# Patient Record
Sex: Female | Born: 1955 | Race: White | Hispanic: No | Smoking: Never smoker
Health system: Southern US, Community
[De-identification: ages and names within clinical notes are randomized; demographics above are authoritative.]

## PROBLEM LIST (undated history)

## (undated) DIAGNOSIS — E785 Hyperlipidemia, unspecified: Secondary | ICD-10-CM

## (undated) DIAGNOSIS — R112 Nausea with vomiting, unspecified: Secondary | ICD-10-CM

## (undated) DIAGNOSIS — I499 Cardiac arrhythmia, unspecified: Secondary | ICD-10-CM

## (undated) DIAGNOSIS — N189 Chronic kidney disease, unspecified: Secondary | ICD-10-CM

## (undated) DIAGNOSIS — M797 Fibromyalgia: Secondary | ICD-10-CM

## (undated) DIAGNOSIS — Z923 Personal history of irradiation: Secondary | ICD-10-CM

## (undated) DIAGNOSIS — K219 Gastro-esophageal reflux disease without esophagitis: Secondary | ICD-10-CM

## (undated) DIAGNOSIS — I471 Supraventricular tachycardia, unspecified: Secondary | ICD-10-CM

## (undated) DIAGNOSIS — Z981 Arthrodesis status: Secondary | ICD-10-CM

## (undated) DIAGNOSIS — D649 Anemia, unspecified: Secondary | ICD-10-CM

## (undated) DIAGNOSIS — K429 Umbilical hernia without obstruction or gangrene: Secondary | ICD-10-CM

## (undated) DIAGNOSIS — T4145XA Adverse effect of unspecified anesthetic, initial encounter: Secondary | ICD-10-CM

## (undated) DIAGNOSIS — T8859XA Other complications of anesthesia, initial encounter: Secondary | ICD-10-CM

## (undated) DIAGNOSIS — I10 Essential (primary) hypertension: Secondary | ICD-10-CM

## (undated) DIAGNOSIS — C50919 Malignant neoplasm of unspecified site of unspecified female breast: Secondary | ICD-10-CM

## (undated) DIAGNOSIS — Z9889 Other specified postprocedural states: Secondary | ICD-10-CM

## (undated) DIAGNOSIS — E119 Type 2 diabetes mellitus without complications: Secondary | ICD-10-CM

## (undated) HISTORY — DX: Fibromyalgia: M79.7

## (undated) HISTORY — DX: Malignant neoplasm of unspecified site of unspecified female breast: C50.919

## (undated) HISTORY — DX: Hyperlipidemia, unspecified: E78.5

## (undated) HISTORY — DX: Supraventricular tachycardia, unspecified: I47.10

## (undated) HISTORY — DX: Gastro-esophageal reflux disease without esophagitis: K21.9

## (undated) HISTORY — DX: Supraventricular tachycardia: I47.1

## (undated) HISTORY — DX: Arthrodesis status: Z98.1

---

## 1995-06-29 HISTORY — PX: OTHER SURGICAL HISTORY: SHX169

## 1998-08-22 ENCOUNTER — Ambulatory Visit (HOSPITAL_COMMUNITY): Admission: RE | Admit: 1998-08-22 | Discharge: 1998-08-22 | Payer: Self-pay | Admitting: Internal Medicine

## 1999-08-28 ENCOUNTER — Encounter: Admission: RE | Admit: 1999-08-28 | Discharge: 1999-08-28 | Payer: Self-pay | Admitting: Obstetrics and Gynecology

## 1999-08-28 ENCOUNTER — Encounter: Payer: Self-pay | Admitting: Obstetrics and Gynecology

## 1999-12-24 ENCOUNTER — Other Ambulatory Visit: Admission: RE | Admit: 1999-12-24 | Discharge: 1999-12-24 | Payer: Self-pay | Admitting: Obstetrics and Gynecology

## 2001-04-07 ENCOUNTER — Other Ambulatory Visit: Admission: RE | Admit: 2001-04-07 | Discharge: 2001-04-07 | Payer: Self-pay | Admitting: Internal Medicine

## 2001-08-03 ENCOUNTER — Ambulatory Visit (HOSPITAL_COMMUNITY): Admission: RE | Admit: 2001-08-03 | Discharge: 2001-08-03 | Payer: Self-pay | Admitting: Internal Medicine

## 2001-08-11 ENCOUNTER — Encounter: Admission: RE | Admit: 2001-08-11 | Discharge: 2001-08-11 | Payer: Self-pay | Admitting: Obstetrics and Gynecology

## 2001-08-11 ENCOUNTER — Encounter: Payer: Self-pay | Admitting: Obstetrics and Gynecology

## 2002-04-10 ENCOUNTER — Other Ambulatory Visit: Admission: RE | Admit: 2002-04-10 | Discharge: 2002-04-10 | Payer: Self-pay | Admitting: Internal Medicine

## 2003-05-09 ENCOUNTER — Other Ambulatory Visit: Admission: RE | Admit: 2003-05-09 | Discharge: 2003-05-09 | Payer: Self-pay | Admitting: Internal Medicine

## 2003-05-16 ENCOUNTER — Ambulatory Visit (HOSPITAL_COMMUNITY): Admission: RE | Admit: 2003-05-16 | Discharge: 2003-05-16 | Payer: Self-pay | Admitting: Internal Medicine

## 2003-06-29 DIAGNOSIS — Z981 Arthrodesis status: Secondary | ICD-10-CM

## 2003-06-29 HISTORY — DX: Arthrodesis status: Z98.1

## 2003-06-29 HISTORY — PX: LUMBAR FUSION: SHX111

## 2003-07-02 ENCOUNTER — Encounter: Admission: RE | Admit: 2003-07-02 | Discharge: 2003-07-02 | Payer: Self-pay | Admitting: Obstetrics and Gynecology

## 2003-11-06 ENCOUNTER — Inpatient Hospital Stay (HOSPITAL_COMMUNITY): Admission: RE | Admit: 2003-11-06 | Discharge: 2003-11-10 | Payer: Self-pay | Admitting: Neurosurgery

## 2004-05-26 ENCOUNTER — Other Ambulatory Visit: Admission: RE | Admit: 2004-05-26 | Discharge: 2004-05-26 | Payer: Self-pay | Admitting: Obstetrics and Gynecology

## 2005-04-13 ENCOUNTER — Encounter: Admission: RE | Admit: 2005-04-13 | Discharge: 2005-04-13 | Payer: Self-pay | Admitting: Internal Medicine

## 2006-06-13 ENCOUNTER — Encounter: Admission: RE | Admit: 2006-06-13 | Discharge: 2006-06-13 | Payer: Self-pay | Admitting: Internal Medicine

## 2006-06-24 ENCOUNTER — Encounter (INDEPENDENT_AMBULATORY_CARE_PROVIDER_SITE_OTHER): Payer: Self-pay | Admitting: Specialist

## 2006-06-24 ENCOUNTER — Encounter: Admission: RE | Admit: 2006-06-24 | Discharge: 2006-06-24 | Payer: Self-pay | Admitting: Internal Medicine

## 2006-06-28 DIAGNOSIS — C50919 Malignant neoplasm of unspecified site of unspecified female breast: Secondary | ICD-10-CM

## 2006-06-28 HISTORY — PX: BREAST LUMPECTOMY: SHX2

## 2006-06-28 HISTORY — DX: Malignant neoplasm of unspecified site of unspecified female breast: C50.919

## 2006-07-03 ENCOUNTER — Encounter: Admission: RE | Admit: 2006-07-03 | Discharge: 2006-07-03 | Payer: Self-pay | Admitting: Internal Medicine

## 2006-07-15 ENCOUNTER — Encounter (INDEPENDENT_AMBULATORY_CARE_PROVIDER_SITE_OTHER): Payer: Self-pay | Admitting: *Deleted

## 2006-07-15 ENCOUNTER — Ambulatory Visit (HOSPITAL_COMMUNITY): Admission: RE | Admit: 2006-07-15 | Discharge: 2006-07-15 | Payer: Self-pay | Admitting: Surgery

## 2006-07-15 ENCOUNTER — Encounter: Admission: RE | Admit: 2006-07-15 | Discharge: 2006-07-15 | Payer: Self-pay | Admitting: Surgery

## 2006-07-22 ENCOUNTER — Ambulatory Visit: Payer: Self-pay | Admitting: Oncology

## 2006-07-27 LAB — COMPREHENSIVE METABOLIC PANEL
AST: 25 U/L (ref 0–37)
Albumin: 4.4 g/dL (ref 3.5–5.2)
BUN: 13 mg/dL (ref 6–23)
CO2: 24 mEq/L (ref 19–32)
Calcium: 9.2 mg/dL (ref 8.4–10.5)
Chloride: 106 mEq/L (ref 96–112)
Creatinine, Ser: 0.78 mg/dL (ref 0.40–1.20)
Glucose, Bld: 118 mg/dL — ABNORMAL HIGH (ref 70–99)
Potassium: 3.6 mEq/L (ref 3.5–5.3)

## 2006-07-27 LAB — CBC WITH DIFFERENTIAL/PLATELET
Basophils Absolute: 0 10*3/uL (ref 0.0–0.1)
EOS%: 2.7 % (ref 0.0–7.0)
Eosinophils Absolute: 0.1 10*3/uL (ref 0.0–0.5)
HCT: 39.5 % (ref 34.8–46.6)
HGB: 13.1 g/dL (ref 11.6–15.9)
LYMPH%: 26.5 % (ref 14.0–48.0)
MCH: 29.6 pg (ref 26.0–34.0)
MCV: 89.5 fL (ref 81.0–101.0)
MONO%: 6.3 % (ref 0.0–13.0)
NEUT%: 63.7 % (ref 39.6–76.8)
Platelets: 320 10*3/uL (ref 145–400)
RDW: 14.3 % (ref 11.3–14.5)

## 2006-08-01 ENCOUNTER — Ambulatory Visit (HOSPITAL_COMMUNITY): Admission: RE | Admit: 2006-08-01 | Discharge: 2006-08-01 | Payer: Self-pay | Admitting: Oncology

## 2006-08-04 ENCOUNTER — Encounter: Admission: RE | Admit: 2006-08-04 | Discharge: 2006-08-04 | Payer: Self-pay | Admitting: Oncology

## 2006-08-11 LAB — CBC WITH DIFFERENTIAL/PLATELET
BASO%: 0.4 % (ref 0.0–2.0)
Basophils Absolute: 0 10*3/uL (ref 0.0–0.1)
EOS%: 3.2 % (ref 0.0–7.0)
HCT: 37.5 % (ref 34.8–46.6)
HGB: 12.8 g/dL (ref 11.6–15.9)
MCH: 30 pg (ref 26.0–34.0)
MONO#: 0.3 10*3/uL (ref 0.1–0.9)
NEUT#: 3.5 10*3/uL (ref 1.5–6.5)
NEUT%: 66.4 % (ref 39.6–76.8)
RDW: 14.1 % (ref 11.3–14.5)
WBC: 5.3 10*3/uL (ref 3.9–10.0)
lymph#: 1.3 10*3/uL (ref 0.9–3.3)

## 2006-08-11 LAB — COMPREHENSIVE METABOLIC PANEL
ALT: 34 U/L (ref 0–35)
AST: 22 U/L (ref 0–37)
Albumin: 4.2 g/dL (ref 3.5–5.2)
BUN: 11 mg/dL (ref 6–23)
CO2: 21 mEq/L (ref 19–32)
Calcium: 9 mg/dL (ref 8.4–10.5)
Chloride: 108 mEq/L (ref 96–112)
Creatinine, Ser: 0.71 mg/dL (ref 0.40–1.20)
Potassium: 4 mEq/L (ref 3.5–5.3)

## 2006-08-11 LAB — FSH/LH: FSH: 75.6 m[IU]/mL

## 2006-08-11 LAB — LACTATE DEHYDROGENASE: LDH: 159 U/L (ref 94–250)

## 2006-08-16 ENCOUNTER — Ambulatory Visit: Admission: RE | Admit: 2006-08-16 | Discharge: 2006-11-02 | Payer: Self-pay | Admitting: Radiation Oncology

## 2006-09-06 ENCOUNTER — Ambulatory Visit: Payer: Self-pay | Admitting: Oncology

## 2006-11-30 ENCOUNTER — Ambulatory Visit: Payer: Self-pay | Admitting: Oncology

## 2006-12-02 LAB — CBC WITH DIFFERENTIAL/PLATELET
Basophils Absolute: 0 10*3/uL (ref 0.0–0.1)
Eosinophils Absolute: 0.1 10*3/uL (ref 0.0–0.5)
HCT: 35.7 % (ref 34.8–46.6)
HGB: 12.3 g/dL (ref 11.6–15.9)
LYMPH%: 21.2 % (ref 14.0–48.0)
MCV: 88.6 fL (ref 81.0–101.0)
MONO#: 0.5 10*3/uL (ref 0.1–0.9)
MONO%: 8.4 % (ref 0.0–13.0)
NEUT#: 4 10*3/uL (ref 1.5–6.5)
NEUT%: 68.7 % (ref 39.6–76.8)
Platelets: 298 10*3/uL (ref 145–400)

## 2006-12-02 LAB — LACTATE DEHYDROGENASE: LDH: 183 U/L (ref 94–250)

## 2006-12-02 LAB — COMPREHENSIVE METABOLIC PANEL
Alkaline Phosphatase: 29 U/L — ABNORMAL LOW (ref 39–117)
BUN: 17 mg/dL (ref 6–23)
CO2: 25 mEq/L (ref 19–32)
Glucose, Bld: 111 mg/dL — ABNORMAL HIGH (ref 70–99)
Total Bilirubin: 0.4 mg/dL (ref 0.3–1.2)

## 2006-12-02 LAB — CANCER ANTIGEN 27.29: CA 27.29: 15 U/mL (ref 0–39)

## 2007-03-07 ENCOUNTER — Encounter (INDEPENDENT_AMBULATORY_CARE_PROVIDER_SITE_OTHER): Payer: Self-pay | Admitting: Gastroenterology

## 2007-03-07 ENCOUNTER — Ambulatory Visit (HOSPITAL_COMMUNITY): Admission: RE | Admit: 2007-03-07 | Discharge: 2007-03-07 | Payer: Self-pay | Admitting: Gastroenterology

## 2007-03-15 ENCOUNTER — Ambulatory Visit: Payer: Self-pay | Admitting: Oncology

## 2007-03-17 LAB — CBC WITH DIFFERENTIAL/PLATELET
BASO%: 0.6 % (ref 0.0–2.0)
Eosinophils Absolute: 0.1 10*3/uL (ref 0.0–0.5)
HCT: 33.3 % — ABNORMAL LOW (ref 34.8–46.6)
LYMPH%: 23.4 % (ref 14.0–48.0)
MONO#: 0.4 10*3/uL (ref 0.1–0.9)
NEUT#: 3.7 10*3/uL (ref 1.5–6.5)
Platelets: 281 10*3/uL (ref 145–400)
RBC: 3.79 10*6/uL (ref 3.70–5.32)
WBC: 5.6 10*3/uL (ref 3.9–10.0)
lymph#: 1.3 10*3/uL (ref 0.9–3.3)

## 2007-03-17 LAB — COMPREHENSIVE METABOLIC PANEL
ALT: 13 U/L (ref 0–35)
Albumin: 3.5 g/dL (ref 3.5–5.2)
CO2: 23 mEq/L (ref 19–32)
Calcium: 8.7 mg/dL (ref 8.4–10.5)
Chloride: 110 mEq/L (ref 96–112)
Glucose, Bld: 103 mg/dL — ABNORMAL HIGH (ref 70–99)
Sodium: 141 mEq/L (ref 135–145)
Total Bilirubin: 0.3 mg/dL (ref 0.3–1.2)
Total Protein: 6.4 g/dL (ref 6.0–8.3)

## 2007-03-17 LAB — LACTATE DEHYDROGENASE: LDH: 178 U/L (ref 94–250)

## 2007-03-17 LAB — CANCER ANTIGEN 27.29: CA 27.29: 16 U/mL (ref 0–39)

## 2007-04-02 IMAGING — CR DG LUMBAR SPINE 2-3V
4 series · 4 of 4 positions shown · non-contrast
Comparison: None.
COMPARISON: None.

CLINICAL DATA: New diagnosis of left-sided breast cancer.  Left lumpectomy.  History of lumbar spinal fusion.
 NUCLEAR MEDICINE WHOLE BODY BONE SCAN ? 08/01/06:
TECHNIQUE: Whole body anterior and posterior images were obtained approximately 3 hours after intravenous injection of radiopharmaceutical. 
 Radiopharmaceutical:  24.0 mCi 2c-11m MDP.

[view not recorded (1 of 4)]
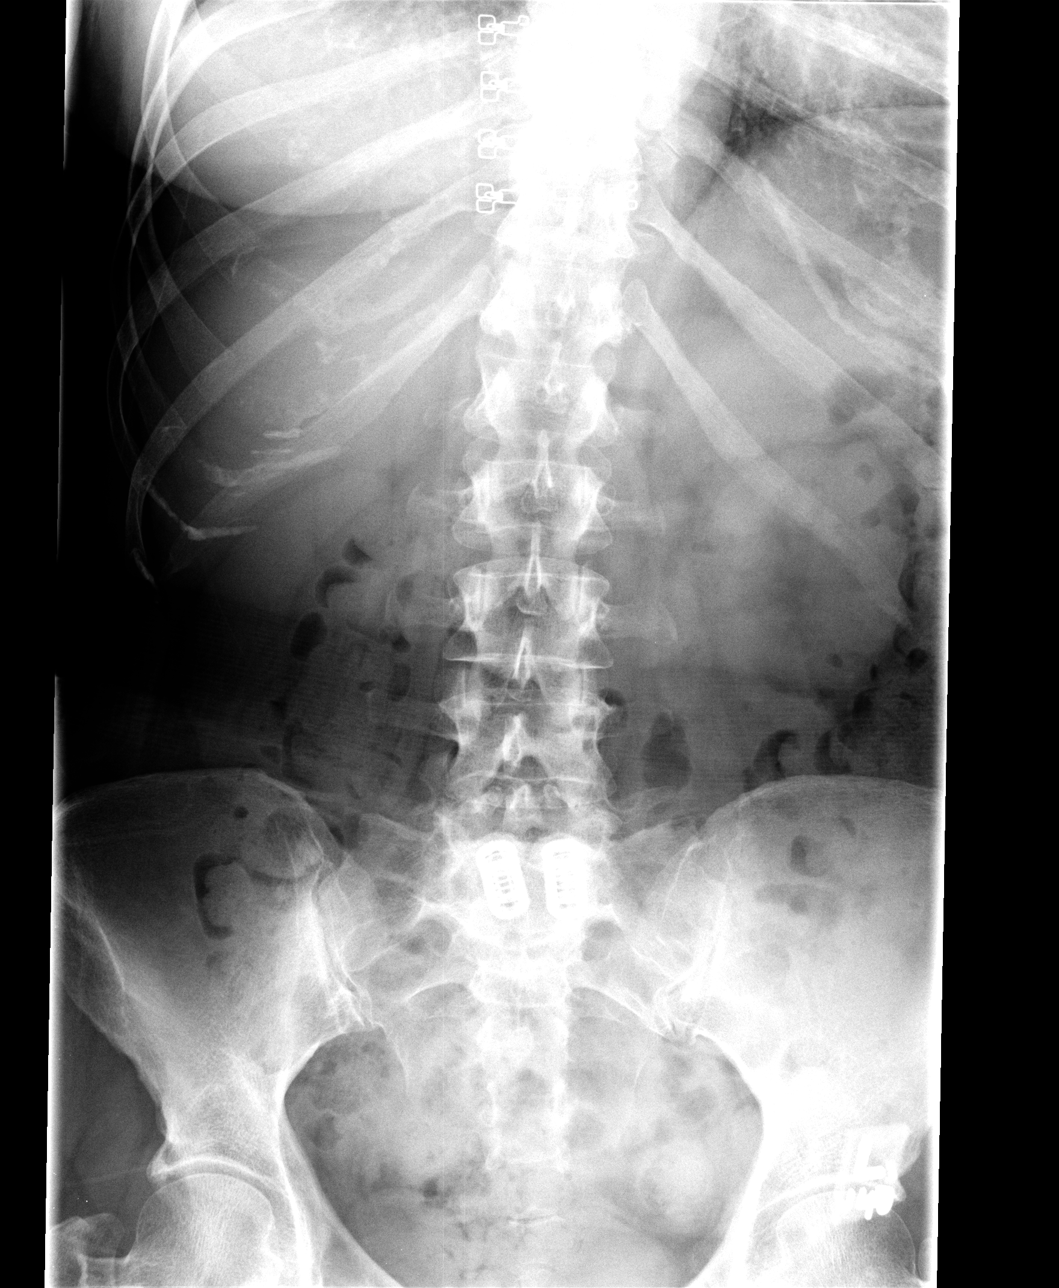

[view not recorded (2 of 4)]
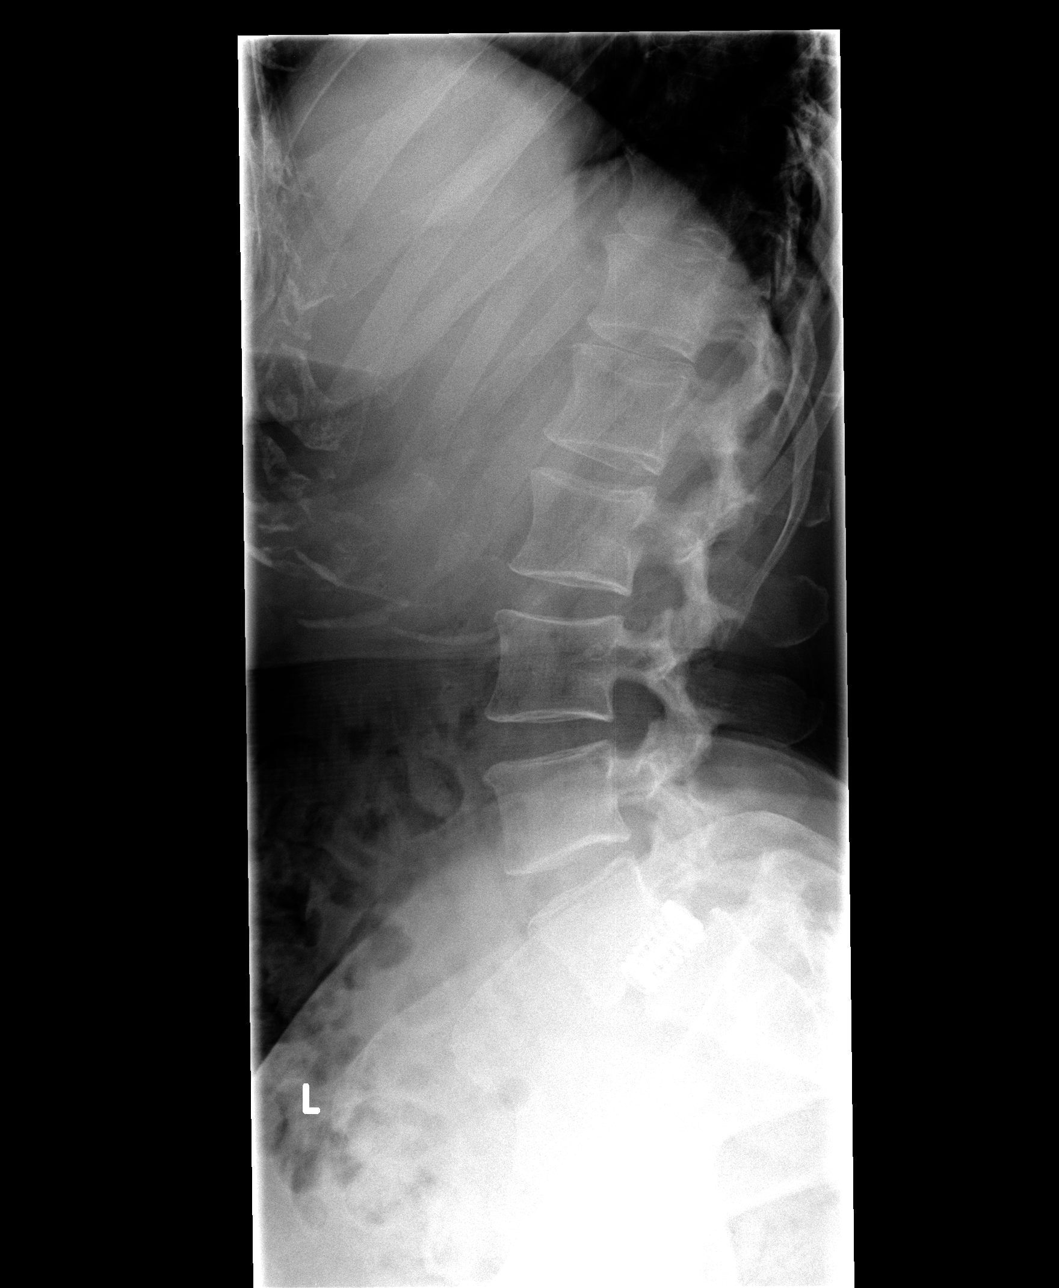

[view not recorded (3 of 4)]
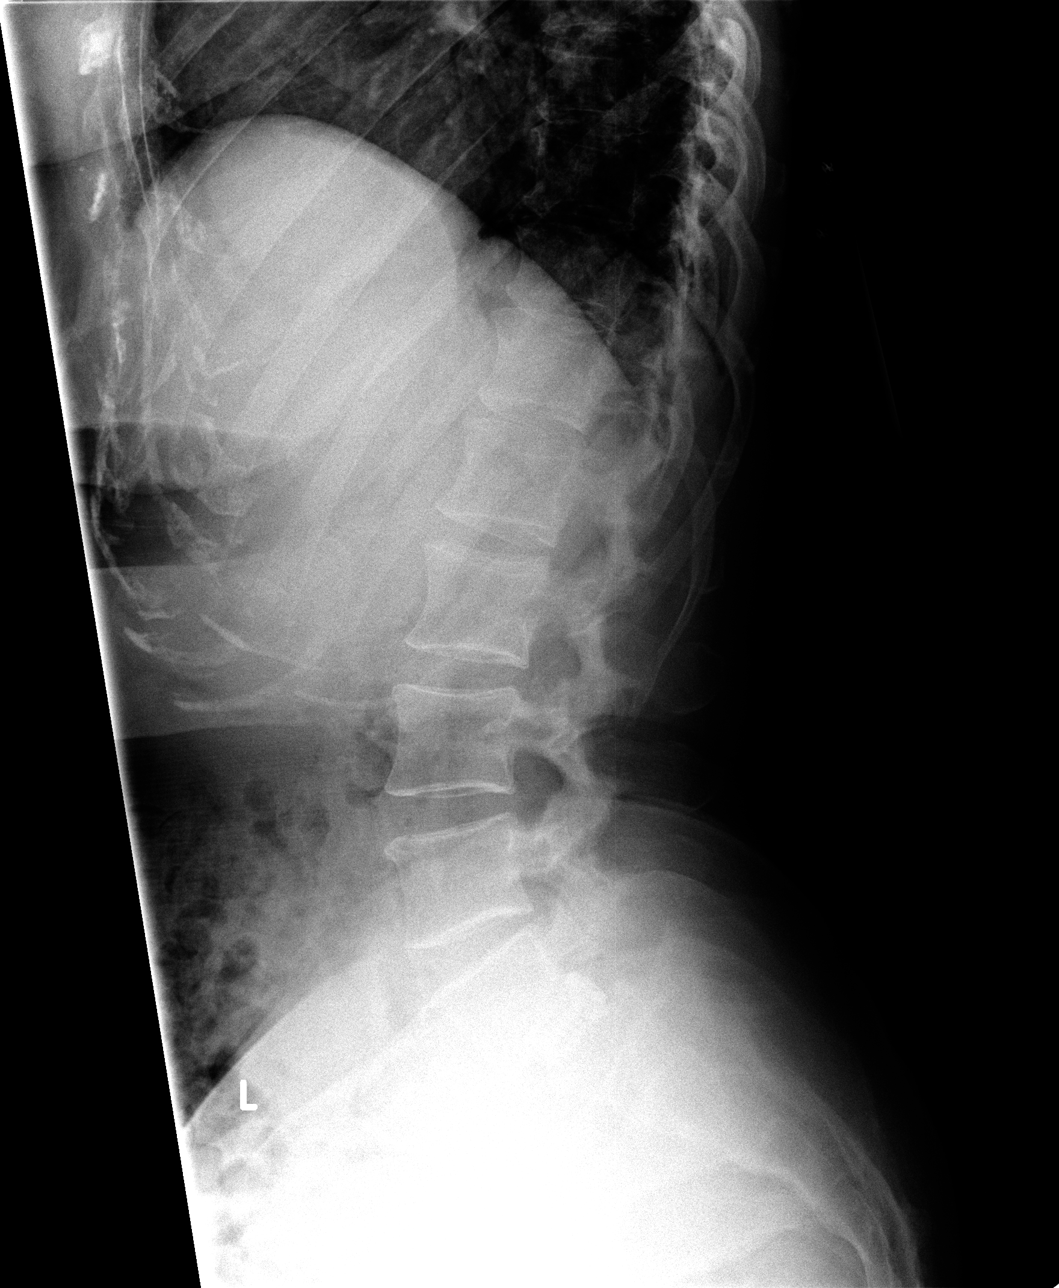

[view not recorded (4 of 4)]
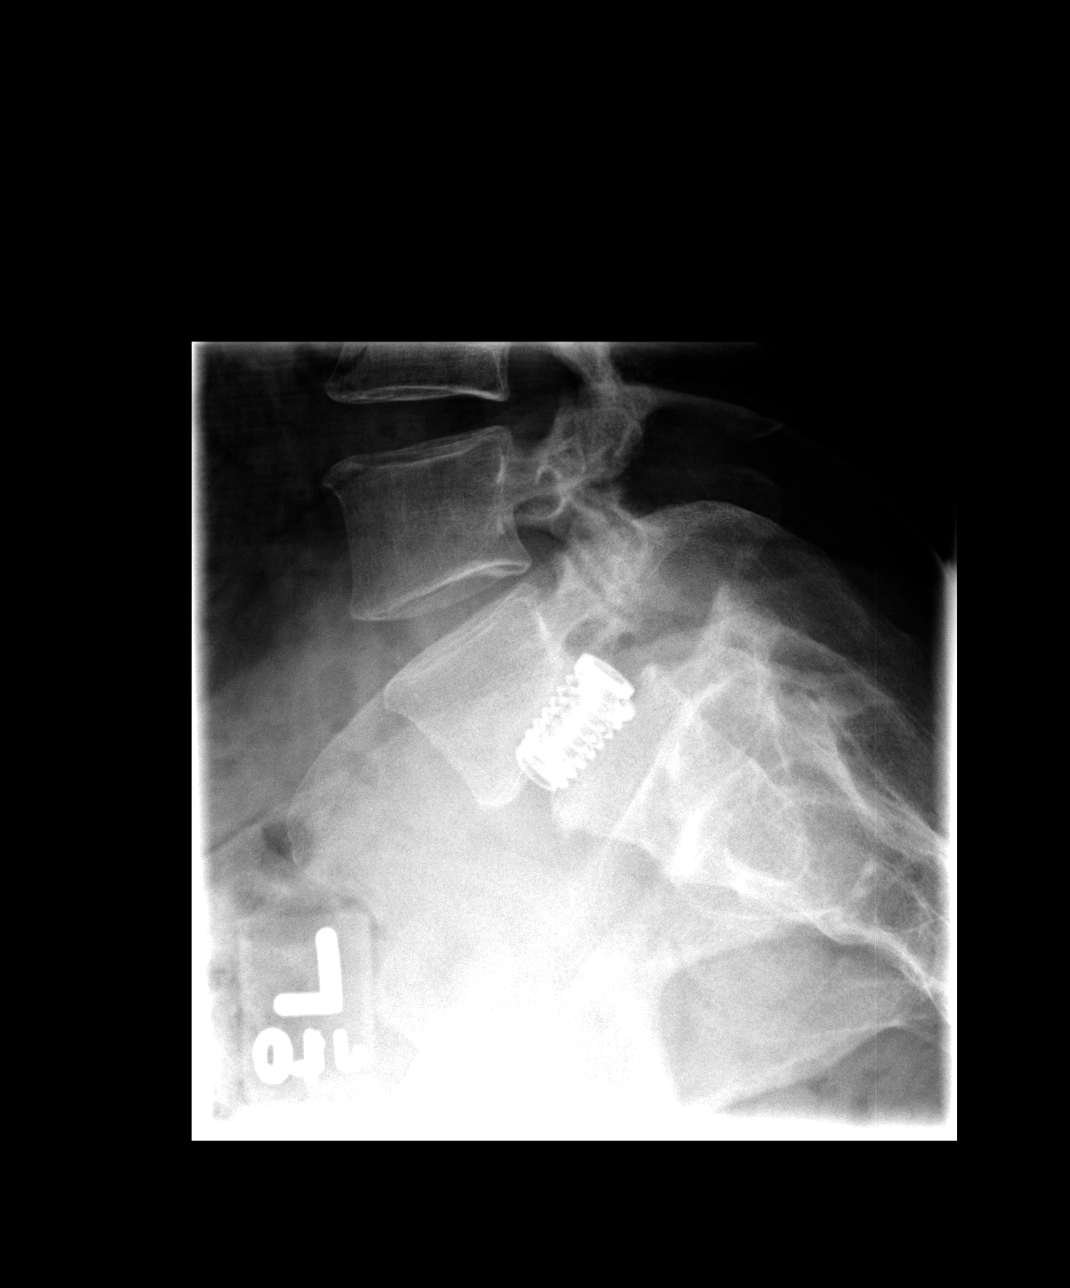

[4 of 4 positions shown; findings below may reference images not displayed]

FINDINGS: Contamination of the right antecubital fossa region.   Right-sided mandibular activity is likely due to dental caries.  Midfoot activity, primarily on the right, is likely degenerative.  There is mild activity about the left chest which on the oblique images is separate from ribs.  Mild activity involves the lumbosacral junction as well.
IMPRESSION: 1.  No evidence of osseous metastasis.
 2.  Left breast activity is likely postoperative.
 3.  Lumbosacral spinal activity will be discussed on plain films below.
 4.  Right mandibular activity is likely dental.  Recommend clinical correlation.
 LUMBOSACRAL SPINE ? 4 VIEWS ? 08/01/06:
FINDINGS: The patient is status post L5-S1 fixation. There is Grade I-II anterolisthesis of L5 on S1.  There is no evidence of underlying osseous metastasis.  Vertebral body height is maintained.
IMPRESSION: Postsurgical changes at the lumbosacral junction without osseous metastasis.

## 2007-05-15 ENCOUNTER — Encounter: Admission: RE | Admit: 2007-05-15 | Discharge: 2007-05-15 | Payer: Self-pay | Admitting: Oncology

## 2007-06-15 ENCOUNTER — Ambulatory Visit: Payer: Self-pay | Admitting: Oncology

## 2007-06-19 LAB — COMPREHENSIVE METABOLIC PANEL
CO2: 25 mEq/L (ref 19–32)
Calcium: 9 mg/dL (ref 8.4–10.5)
Chloride: 106 mEq/L (ref 96–112)
Creatinine, Ser: 0.91 mg/dL (ref 0.40–1.20)
Glucose, Bld: 94 mg/dL (ref 70–99)
Total Bilirubin: 0.3 mg/dL (ref 0.3–1.2)

## 2007-06-19 LAB — CBC WITH DIFFERENTIAL/PLATELET
Basophils Absolute: 0.1 10*3/uL (ref 0.0–0.1)
Eosinophils Absolute: 0.1 10*3/uL (ref 0.0–0.5)
HCT: 34.7 % — ABNORMAL LOW (ref 34.8–46.6)
HGB: 12.1 g/dL (ref 11.6–15.9)
LYMPH%: 27.7 % (ref 14.0–48.0)
MONO#: 0.5 10*3/uL (ref 0.1–0.9)
NEUT#: 3.1 10*3/uL (ref 1.5–6.5)
NEUT%: 60 % (ref 39.6–76.8)
Platelets: 285 10*3/uL (ref 145–400)
WBC: 5.1 10*3/uL (ref 3.9–10.0)
lymph#: 1.4 10*3/uL (ref 0.9–3.3)

## 2007-06-19 LAB — CANCER ANTIGEN 27.29: CA 27.29: 13 U/mL (ref 0–39)

## 2007-06-19 LAB — LACTATE DEHYDROGENASE: LDH: 200 U/L (ref 94–250)

## 2007-10-16 ENCOUNTER — Ambulatory Visit: Payer: Self-pay | Admitting: Oncology

## 2007-10-18 LAB — CBC WITH DIFFERENTIAL/PLATELET
BASO%: 1.1 % (ref 0.0–2.0)
Basophils Absolute: 0.1 10*3/uL (ref 0.0–0.1)
EOS%: 2.8 % (ref 0.0–7.0)
HCT: 34.3 % — ABNORMAL LOW (ref 34.8–46.6)
HGB: 12 g/dL (ref 11.6–15.9)
LYMPH%: 24.3 % (ref 14.0–48.0)
MCH: 30.8 pg (ref 26.0–34.0)
MCHC: 34.8 g/dL (ref 32.0–36.0)
MCV: 88.4 fL (ref 81.0–101.0)
MONO%: 8 % (ref 0.0–13.0)
NEUT%: 63.8 % (ref 39.6–76.8)
Platelets: 328 10*3/uL (ref 145–400)
lymph#: 1.3 10*3/uL (ref 0.9–3.3)

## 2007-10-19 LAB — COMPREHENSIVE METABOLIC PANEL
ALT: 27 U/L (ref 0–35)
AST: 21 U/L (ref 0–37)
Alkaline Phosphatase: 25 U/L — ABNORMAL LOW (ref 39–117)
BUN: 19 mg/dL (ref 6–23)
Calcium: 9.4 mg/dL (ref 8.4–10.5)
Chloride: 109 mEq/L (ref 96–112)
Creatinine, Ser: 1.02 mg/dL (ref 0.40–1.20)
Total Bilirubin: 0.3 mg/dL (ref 0.3–1.2)

## 2007-10-30 LAB — ESTRADIOL, ULTRA SENS: Estradiol, Ultra Sensitive: 9 pg/mL

## 2008-02-13 ENCOUNTER — Ambulatory Visit: Payer: Self-pay | Admitting: Vascular Surgery

## 2008-02-26 ENCOUNTER — Emergency Department (HOSPITAL_COMMUNITY): Admission: EM | Admit: 2008-02-26 | Discharge: 2008-02-26 | Payer: Self-pay | Admitting: Family Medicine

## 2008-04-22 ENCOUNTER — Ambulatory Visit: Payer: Self-pay | Admitting: Oncology

## 2008-04-24 LAB — CBC WITH DIFFERENTIAL/PLATELET
Basophils Absolute: 0 10*3/uL (ref 0.0–0.1)
Eosinophils Absolute: 0.1 10*3/uL (ref 0.0–0.5)
HGB: 12 g/dL (ref 11.6–15.9)
LYMPH%: 19.4 % (ref 14.0–48.0)
MCV: 90.1 fL (ref 81.0–101.0)
MONO#: 0.4 10*3/uL (ref 0.1–0.9)
MONO%: 5.6 % (ref 0.0–13.0)
NEUT#: 5 10*3/uL (ref 1.5–6.5)
Platelets: 329 10*3/uL (ref 145–400)
RDW: 14.4 % (ref 11.3–14.5)
WBC: 6.8 10*3/uL (ref 3.9–10.0)

## 2008-04-25 LAB — COMPREHENSIVE METABOLIC PANEL
Albumin: 4.5 g/dL (ref 3.5–5.2)
Alkaline Phosphatase: 31 U/L — ABNORMAL LOW (ref 39–117)
BUN: 22 mg/dL (ref 6–23)
CO2: 23 mEq/L (ref 19–32)
Glucose, Bld: 136 mg/dL — ABNORMAL HIGH (ref 70–99)
Potassium: 3.8 mEq/L (ref 3.5–5.3)
Sodium: 143 mEq/L (ref 135–145)
Total Protein: 7.2 g/dL (ref 6.0–8.3)

## 2008-04-25 LAB — LACTATE DEHYDROGENASE: LDH: 241 U/L (ref 94–250)

## 2008-04-25 LAB — CANCER ANTIGEN 27.29: CA 27.29: 20 U/mL (ref 0–39)

## 2008-04-25 LAB — FOLLICLE STIMULATING HORMONE: FSH: 88.3 m[IU]/mL

## 2008-04-30 LAB — ESTRADIOL, ULTRA SENS

## 2008-05-15 ENCOUNTER — Encounter: Admission: RE | Admit: 2008-05-15 | Discharge: 2008-05-15 | Payer: Self-pay | Admitting: Internal Medicine

## 2008-05-21 ENCOUNTER — Ambulatory Visit: Payer: Self-pay | Admitting: Vascular Surgery

## 2008-06-17 ENCOUNTER — Ambulatory Visit: Payer: Self-pay | Admitting: Internal Medicine

## 2008-06-17 ENCOUNTER — Ambulatory Visit: Payer: Self-pay | Admitting: Vascular Surgery

## 2008-07-01 ENCOUNTER — Ambulatory Visit: Payer: Self-pay | Admitting: Vascular Surgery

## 2008-08-01 ENCOUNTER — Ambulatory Visit: Payer: Self-pay | Admitting: Vascular Surgery

## 2008-10-04 ENCOUNTER — Encounter: Admission: RE | Admit: 2008-10-04 | Discharge: 2008-10-04 | Payer: Self-pay | Admitting: Neurosurgery

## 2008-10-28 ENCOUNTER — Ambulatory Visit: Payer: Self-pay | Admitting: Oncology

## 2008-10-30 LAB — CBC WITH DIFFERENTIAL/PLATELET
Basophils Absolute: 0 10*3/uL (ref 0.0–0.1)
Eosinophils Absolute: 0.1 10*3/uL (ref 0.0–0.5)
HGB: 11.5 g/dL — ABNORMAL LOW (ref 11.6–15.9)
LYMPH%: 25.1 % (ref 14.0–49.7)
MCV: 90.7 fL (ref 79.5–101.0)
MONO#: 0.3 10*3/uL (ref 0.1–0.9)
MONO%: 6 % (ref 0.0–14.0)
NEUT#: 3.8 10*3/uL (ref 1.5–6.5)
Platelets: 378 10*3/uL (ref 145–400)
WBC: 5.7 10*3/uL (ref 3.9–10.3)

## 2008-10-30 LAB — COMPREHENSIVE METABOLIC PANEL
Albumin: 3.8 g/dL (ref 3.5–5.2)
Alkaline Phosphatase: 28 U/L — ABNORMAL LOW (ref 39–117)
BUN: 20 mg/dL (ref 6–23)
CO2: 25 mEq/L (ref 19–32)
Glucose, Bld: 121 mg/dL — ABNORMAL HIGH (ref 70–99)
Potassium: 3.3 mEq/L — ABNORMAL LOW (ref 3.5–5.3)
Total Protein: 7.2 g/dL (ref 6.0–8.3)

## 2008-10-31 LAB — CANCER ANTIGEN 27.29: CA 27.29: 23 U/mL (ref 0–39)

## 2008-10-31 LAB — FOLLICLE STIMULATING HORMONE: FSH: 81.4 m[IU]/mL

## 2008-11-08 LAB — ESTRADIOL, ULTRA SENS

## 2008-11-29 ENCOUNTER — Ambulatory Visit: Payer: Self-pay | Admitting: Internal Medicine

## 2009-05-16 ENCOUNTER — Encounter: Admission: RE | Admit: 2009-05-16 | Discharge: 2009-05-16 | Payer: Self-pay | Admitting: Oncology

## 2009-05-27 ENCOUNTER — Ambulatory Visit: Payer: Self-pay | Admitting: Oncology

## 2009-05-29 LAB — COMPREHENSIVE METABOLIC PANEL
ALT: 31 U/L (ref 0–35)
AST: 24 U/L (ref 0–37)
Alkaline Phosphatase: 29 U/L — ABNORMAL LOW (ref 39–117)
Sodium: 141 mEq/L (ref 135–145)
Total Bilirubin: 0.4 mg/dL (ref 0.3–1.2)
Total Protein: 6.9 g/dL (ref 6.0–8.3)

## 2009-05-29 LAB — CBC WITH DIFFERENTIAL/PLATELET
BASO%: 0.6 % (ref 0.0–2.0)
Basophils Absolute: 0 10*3/uL (ref 0.0–0.1)
EOS%: 2.4 % (ref 0.0–7.0)
Eosinophils Absolute: 0.2 10*3/uL (ref 0.0–0.5)
HGB: 11.1 g/dL — ABNORMAL LOW (ref 11.6–15.9)
LYMPH%: 28.3 % (ref 14.0–49.7)
MCV: 92.3 fL (ref 79.5–101.0)
NEUT#: 4.1 10*3/uL (ref 1.5–6.5)
NEUT%: 62.5 % (ref 38.4–76.8)
Platelets: 307 10*3/uL (ref 145–400)
RDW: 14.6 % — ABNORMAL HIGH (ref 11.2–14.5)
nRBC: 0 % (ref 0–0)

## 2009-06-26 ENCOUNTER — Ambulatory Visit: Payer: Self-pay | Admitting: Internal Medicine

## 2009-09-25 ENCOUNTER — Ambulatory Visit: Payer: Self-pay | Admitting: Internal Medicine

## 2009-12-05 ENCOUNTER — Ambulatory Visit: Payer: Self-pay | Admitting: Oncology

## 2009-12-09 LAB — COMPREHENSIVE METABOLIC PANEL
AST: 41 U/L — ABNORMAL HIGH (ref 0–37)
Albumin: 3.4 g/dL — ABNORMAL LOW (ref 3.5–5.2)
Alkaline Phosphatase: 28 U/L — ABNORMAL LOW (ref 39–117)
Calcium: 8.9 mg/dL (ref 8.4–10.5)
Chloride: 106 mEq/L (ref 96–112)
Glucose, Bld: 104 mg/dL — ABNORMAL HIGH (ref 70–99)
Potassium: 4.8 mEq/L (ref 3.5–5.3)
Sodium: 140 mEq/L (ref 135–145)
Total Protein: 6.8 g/dL (ref 6.0–8.3)

## 2009-12-09 LAB — CANCER ANTIGEN 27.29: CA 27.29: 21 U/mL (ref 0–39)

## 2009-12-09 LAB — VITAMIN D 25 HYDROXY (VIT D DEFICIENCY, FRACTURES): Vit D, 25-Hydroxy: 52 ng/mL (ref 30–89)

## 2009-12-16 LAB — CBC WITH DIFFERENTIAL/PLATELET
BASO%: 0.7 % (ref 0.0–2.0)
Basophils Absolute: 0 10*3/uL (ref 0.0–0.1)
EOS%: 4.1 % (ref 0.0–7.0)
MCH: 30.7 pg (ref 25.1–34.0)
MCHC: 33.5 g/dL (ref 31.5–36.0)
MCV: 91.5 fL (ref 79.5–101.0)
MONO%: 6.5 % (ref 0.0–14.0)
RBC: 3.5 10*6/uL — ABNORMAL LOW (ref 3.70–5.45)
RDW: 14.4 % (ref 11.2–14.5)

## 2009-12-25 ENCOUNTER — Ambulatory Visit: Payer: Self-pay | Admitting: Internal Medicine

## 2010-05-18 ENCOUNTER — Encounter: Admission: RE | Admit: 2010-05-18 | Discharge: 2010-05-18 | Payer: Self-pay | Admitting: Oncology

## 2010-06-08 ENCOUNTER — Ambulatory Visit: Payer: Self-pay | Admitting: Oncology

## 2010-06-10 LAB — CBC WITH DIFFERENTIAL/PLATELET
Eosinophils Absolute: 0.3 10*3/uL (ref 0.0–0.5)
MCV: 90.4 fL (ref 79.5–101.0)
MONO%: 7.4 % (ref 0.0–14.0)
NEUT#: 3.7 10*3/uL (ref 1.5–6.5)
RBC: 3.65 10*6/uL — ABNORMAL LOW (ref 3.70–5.45)
RDW: 15.8 % — ABNORMAL HIGH (ref 11.2–14.5)
WBC: 6 10*3/uL (ref 3.9–10.3)
lymph#: 1.6 10*3/uL (ref 0.9–3.3)

## 2010-06-10 LAB — COMPREHENSIVE METABOLIC PANEL
AST: 44 U/L — ABNORMAL HIGH (ref 0–37)
Albumin: 3.3 g/dL — ABNORMAL LOW (ref 3.5–5.2)
Alkaline Phosphatase: 30 U/L — ABNORMAL LOW (ref 39–117)
Glucose, Bld: 133 mg/dL — ABNORMAL HIGH (ref 70–99)
Potassium: 3.8 mEq/L (ref 3.5–5.3)
Sodium: 141 mEq/L (ref 135–145)
Total Protein: 6.2 g/dL (ref 6.0–8.3)

## 2010-06-25 ENCOUNTER — Ambulatory Visit: Payer: Self-pay | Admitting: Internal Medicine

## 2010-07-02 ENCOUNTER — Encounter
Admission: RE | Admit: 2010-07-02 | Discharge: 2010-07-02 | Payer: Self-pay | Source: Home / Self Care | Attending: Internal Medicine | Admitting: Internal Medicine

## 2010-07-02 ENCOUNTER — Ambulatory Visit
Admission: RE | Admit: 2010-07-02 | Discharge: 2010-07-02 | Payer: Self-pay | Source: Home / Self Care | Attending: Internal Medicine | Admitting: Internal Medicine

## 2010-07-19 ENCOUNTER — Encounter: Payer: Self-pay | Admitting: Oncology

## 2010-07-30 ENCOUNTER — Ambulatory Visit: Payer: Self-pay | Admitting: Internal Medicine

## 2010-08-03 ENCOUNTER — Ambulatory Visit (INDEPENDENT_AMBULATORY_CARE_PROVIDER_SITE_OTHER): Payer: Commercial Managed Care - PPO | Admitting: Internal Medicine

## 2010-08-03 DIAGNOSIS — R6889 Other general symptoms and signs: Secondary | ICD-10-CM

## 2010-08-14 ENCOUNTER — Other Ambulatory Visit: Payer: Self-pay | Admitting: Orthopedic Surgery

## 2010-08-14 DIAGNOSIS — R609 Edema, unspecified: Secondary | ICD-10-CM

## 2010-08-14 DIAGNOSIS — M1990 Unspecified osteoarthritis, unspecified site: Secondary | ICD-10-CM

## 2010-08-18 ENCOUNTER — Other Ambulatory Visit: Payer: Commercial Managed Care - PPO

## 2010-08-18 ENCOUNTER — Ambulatory Visit
Admission: RE | Admit: 2010-08-18 | Discharge: 2010-08-18 | Disposition: A | Discharge status: Home / Self Care | Payer: Commercial Managed Care - PPO | Source: Ambulatory Visit | Attending: Orthopedic Surgery | Admitting: Orthopedic Surgery

## 2010-08-18 DIAGNOSIS — R609 Edema, unspecified: Secondary | ICD-10-CM

## 2010-08-18 DIAGNOSIS — M1990 Unspecified osteoarthritis, unspecified site: Secondary | ICD-10-CM

## 2010-11-10 NOTE — Procedures (Signed)
DUPLEX DEEP VENOUS EXAM - LOWER EXTREMITY   INDICATION:  GSV ablation.   HISTORY:  Edema:  No  Trauma/Surgery:  Right great saphenous vein ablation on 06/17/2008.  Pain:  No  PE:  No  Previous DVT:  No  Anticoagulants:  No  Other:   DUPLEX EXAM:                CFV   SFV   PopV  PTV    GSV                R  L  R  L  R  L  R   L  R  L  Thrombosis    o  o  o     o     o      +  Spontaneous   +  +  +     +     +      0  Phasic        +  +  +     +     +      o  Augmentation  +  +  +     +     +      o  Compressible  +  +  +     +     +      o  Competent     +  +  +     +     +      o   Legend:  + - yes  o - no  p - partial  D - decreased   IMPRESSION:  No evidence of deep vein on the right lower extremity.   The right great saphenous vein appears ablated from proximal thigh to  below knee.    _____________________________  Quita Skye Hart [place], M.D.   AC/MEDQ  D:  07/01/2008  T:  07/02/2008  Job:  [id number]

## 2010-11-10 NOTE — Op Note (Signed)
Jill Richardson, KOCH                 ACCOUNT NO.:  [id number]   MEDICAL RECORD NO.:  [id number]          PATIENT TYPE:  AMB   LOCATION:  ENDO                         FACILITY:  [facility]   PHYSICIAN:  Bernette Redbird, M.D.   DATE OF BIRTH:  [birth year]   DATE OF PROCEDURE:  03/07/2007  DATE OF DISCHARGE:                               OPERATIVE REPORT   PROCEDURE:  Colonoscopy with biopsy.   INDICATIONS:  55 year old for initial colon cancer screening.   FINDINGS:  Diminutive rectal polyp.  Minimal sigmoid diverticulosis.   PROCEDURE IN DETAIL:  The purpose and risks of the procedure had been  discussed with the patient who provided written consent.  Sedation was  Phenergan 25 mg, fentanyl 100 mcg and Versed 8 mg IV without arrhythmias  or desaturation.  The Pentax adult video colonoscope was advanced with  moderate difficulty through a somewhat fixated sigmoid region, finding  that advancement was made easier with the patient in the supine  position.  The cecum was identified by the absence of further lumen and  what appeared to be the appendiceal orifice as well as clear  visualization of the ileocecal valve and the orifice of the terminal  ileum, although the terminal ileum was not actually intubated.  Pullback  was then performed.  The quality of the prep was very good and it is  felt that all areas were well seen.   In the rectum at about 15 cm from the external anal opening was a 3 mm  polyp, slightly pedunculated, removed by single cold biopsy.  No other  polyps were seen and there was no evidence of cancer, colitis or  vascular ectasia anywhere in the colon.  There was some minimal sigmoid  diverticulosis, with a small mouth diverticula present.  Retroflexion in  the rectum and reinspection of the rectum was unremarkable.  The patient  tolerated the procedure well and there were no apparent complications.   IMPRESSION:  1. Solitary diminutive rectal polyp removed as described  above.  2. Minimal sigmoid diverticulosis.   PLAN:  Await pathology results.  Follow up colonoscopy in five years if  the polyp is adenomatous in character, otherwise, follow-up colonoscopy  for routine screening would probably be in ten years.           ______________________________  Bernette Redbird, M.D.     RB/MEDQ  D:  03/07/2007  T:  03/07/2007  Job:  [id number]   cc:   Luanna Cole. Lenord Fellers, M.D.  Fax: [phone]

## 2010-11-10 NOTE — Assessment & Plan Note (Signed)
OFFICE VISIT   SHATERRA, SANZONE  DOB:  1956/06/17                                       05/21/2008  EAVWU#:[id number]   The patient returns today for further followup regarding her severe  venous insufficiency of the right leg.  She has large bulbous  varicosities beginning into the distal thigh and extending into the calf  which causes a heavy, aching and throbbing discomfort as the day  progresses.  She works as a Designer, jewellery at [employer].  Also  develops swelling in the ankle as the day progresses aggravating her  symptoms more.  She has tried long leg elastic graduated compression  stockings (20 mm-30 mm) as well as ibuprofen and elevation of her legs  as much as her job will permit over the last 3 months with no success.  She continues to have symptoms which are affecting not only her  activities of daily living at home but also her work schedule.  She has  been shown to have gross reflux at the right saphenofemoral junction and  throughout the right great saphenous vein feeding these large  varicosities.  I think it would be helped by laser ablation of the right  great saphenous vein with multiple stab phlebectomies to treat this.  We  will proceed with precertification and schedule her right leg procedure  in the near future.   Quita Skye Hart [place], M.D.  Electronically Signed   JDL/MEDQ  D:  05/21/2008  T:  05/22/2008  Job:  [id number]

## 2010-11-10 NOTE — Procedures (Signed)
LOWER EXTREMITY VENOUS REFLUX EXAM   INDICATION:  Bilateral varicose veins with pain.   EXAM:  Using color-flow imaging and pulse Doppler spectral analysis, the  right and left common femoral, superficial femoral, popliteal, posterior  tibial, greater and lesser saphenous veins are evaluated.  There is  evidence suggesting deep venous insufficiency in the right lower  extremity, the left shows mild at the saphenofemoral junction only.   The right saphenofemoral junction is not competent, the left is mildly  incompetent.  The right GSV is not competent with the caliber as  described below.  The left is competent.   The right and left proximal short saphenous veins demonstrate  competency.   GSV Diameter (used if found to be incompetent only)                                            Right    Left  Proximal Greater Saphenous Vein           0.8 cm   cm  Proximal-to-mid-thigh                     cm       cm  Mid thigh                                 0.52 cm  cm  Mid-distal thigh                          cm       cm  Distal thigh                              0.54 cm  cm  Knee                                      2.46 cm  cm   IMPRESSION:  1. Right greater saphenous vein reflux is identified with the caliber      ranging from 2.46 cm to 0.8 cm knee to groin.  2. The right greater saphenous vein is dilated at the knee, measuring      1.92 cm X 2.46 cm.  The left is within normal limits.  3. The right and left greater saphenous veins are not tortuous.  4. The deep venous system is not competent on the right.  The left is      competent.  5. The right and left lesser saphenous veins are competent.   ___________________________________________  Quita Skye. Hart [place], M.D.   AS/MEDQ  D:  02/13/2008  T:  02/13/2008  Job:  [id number]

## 2010-11-10 NOTE — Consult Note (Signed)
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  She was referred by Dr. Lenord Fellers for evaluation of venous  insufficiency in the right leg which is very symptomatic.  This 55-year-  old female states that she had a large varicosities in the right leg for  at least 5 years which have become increasingly symptomatic.  She  describes the pain as an aching heavy throbbing discomfort which  progresses as the day progresses.  It is mostly in the distal thigh and  calf where her pain symptomatology is located.  She has swelling in the  ankle as the day progresses but has no history of thrombophlebitis, deep  venous thrombosis, stasis ulcers bleeding.  She has elevated her legs on  occasion which helps but she is unable to this on a regular basis at  work.  She has not to tried elastic graduated compression stockings.  She takes Tylenol and ibuprofen with no relief of her symptoms.  These  symptoms are certainly affecting her daily living at the present time  she states.   PAST MEDICAL HISTORY:  1. Supraventricular tachycardia treated with Lanoxin.  2. Negative for diabetes, stroke, hypertension, hyperlipidemia or      coronary artery disease.  She has some mild COPD.   PREVIOUS SURGERY:  1. Includes salpingo-oophorectomy.  2. Exploratory laparotomy for small-bowel obstruction.  3. Lumbar fusion.  4. Lumpectomy in the left breast for a localized cancer last year.   FAMILY HISTORY:  Is positive coronary artery disease in the father  diabetes in her father.  Negative for stroke.  There is a history of  varicose veins in her mother and sisters.   ALLERGIES:  Lodine and Lipitor.   MEDICATIONS:  Please see health history form.   SOCIAL HISTORY:  Is negative for tobacco and alcohol.   REVIEW OF SYSTEMS:   Denies any chest pain, dyspnea on exertion, PND,  orthopnea, has had occasional palpitations related to her SVT which is  generally controlled very well.   PHYSICAL EXAMINATION:  Blood pressure 140/88, heart rate 70,  respirations 14.  General:  She is a middle-aged female in no apparent  stress alert and x3.  Neck is supple 3+ carotid pulse palpable.  No  bruits are audible.  Neurologic:  Normal.  No palpable adenopathy in the  neck.  Chest:  Clear to auscultation.  Cardiovascular:  Regular rhythm.  No murmurs.  Abdomen:  Soft, nontender with no palpable masses.  3+  femoral popliteal, dorsalis pedis pulses bilaterally.   The right leg has severe greater saphenous varicosities starting in the  knee level extending into the right medial calf primarily.  She also has  posterior varicosities in the calf area on the right as well as some  lateral spider veins between the knee and the ankle on the right.  Left  leg has spider veins and small varicosities in the greater saphenous  system and posteriorly in the left calf.  Venous duplex exam was  performed in our office today which revealed gross reflux in the right  great saphenofemoral junction and throughout right greater saphenous  vein down to  the knee level with some moderate reflux in the deep venous  system.  There is no evidence of deep venous thrombosis.  Left leg has  very minimal reflux at the saphenofemoral junction only but not  throughout the great saphenous vein.   I think she does have symptomatic greater saphenous varicosities in the  right leg which we will treat conservatively initially with elastic  compression stockings (long leg - 20-30 mm graduated compression) plus  elevation as much as possible and analgesics on a regular basis  (ibuprofen).  She will return in 3 months to see if these symptoms have  been relieved.  If not I think she would be a good candidate laser  ablation of right great saphenous vein with  multiple stab phlebectomy of  relieve these symptoms which are affecting her daily living and work.   Quita Skye Hart [place], M.D.  Electronically Signed  JDL/MEDQ  D:  02/13/2008  T:  02/14/2008  Job:  [id number]   cc:   Luanna Cole. Lenord Fellers, M.D.

## 2010-11-10 NOTE — Assessment & Plan Note (Signed)
OFFICE VISIT   Jill Richardson, Jill Richardson  DOB:  [birth year]                                       07/01/2008  ZOXWR#:[id number]   [name] underwent laser ablation of her right great saphenous vein  with multiple stab phlebectomies on June 17, 2008, for painful  varicosities in the right leg and the calf and thigh area.  She has had  an excellent early result with no discomfort in the calf and thigh and  the stab phlebectomy areas.  She had some mild aching over the laser  ablation site which has completely resolved.  She has had no distal  swelling.  She has been wearing her elastic compression stockings and  took the ibuprofen for 1 week as instructed.   On exam, there is minimal tenderness along the great saphenous vein in  the right thigh.  Stab phlebectomy wounds are all healing well with no  distal edema.  She does have some residual spider and reticular veins in  the calf and distal thigh, which she would like treated with  sclerotherapy in the future.  Venous duplex exam reveals total occlusion  of right great saphenous vein from near the saphenofemoral junction to  below the knee with no evidence of deep venous obstruction.  She was  reassured regarding these problems and will be in touch with [place] when she  would like proceed with her sclerotherapy.   Quita Skye Hart [place], M.D.  Electronically Signed   JDL/MEDQ  D:  07/01/2008  T:  07/02/2008  Job:  [id number]

## 2010-11-13 NOTE — Op Note (Signed)
Jill Richardson, Jill Richardson                 ACCOUNT NO.:  [id number]   MEDICAL RECORD NO.:  [id number]          PATIENT TYPE:  AMB   LOCATION:  SDS                          FACILITY:  [facility]   PHYSICIAN:  Currie Paris, M.D.DATE OF BIRTH:  07-Mar-1956   DATE OF PROCEDURE:  07/15/2006  DATE OF DISCHARGE:  07/15/2006                               OPERATIVE REPORT   PREOPERATIVE DIAGNOSIS:  Carcinoma left breast lower inner quadrant.   POSTOPERATIVE DIAGNOSIS:  Carcinoma left breast lower inner quadrant.   OPERATION:  Needle localized wide local excision left breast cancer,  blue dye injection, left axillary sentinel lymph node biopsy (two  nodes).   SURGEON:  Currie Paris, M.D.   ANESTHESIA:  General.   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  That appeared to her only area of abnormality.  It  was decided to proceed to a needle localized wide local excision with  sentinel node evaluation.   DESCRIPTION OF PROCEDURE:  The patient was seen in the holding area and  she had no further questions.  The left breast was marked by myself as  the operative side.  The films were done and she had already been  injected with the radio-isotope and we appeared to have good  positioning.   The patient was then taken to the operating room and after satisfactory  general anesthesia had been obtained, the left breast was prepped and  draped as a sterile field.  The time-out occurred.  I injected about 5  mL of dilute methylene blue (2 mL methylene blue and 3 mL of injectable  saline).  This was massaged after being injected subareolarly.   Attention was turned first to the lumpectomy.  The guidewire entered  almost at the inframammary fold and tracked superiorly and laterally  towards the nipple as it entered fairly medially.  I made an elliptical  skin incision to include the  guidewire entry site.  I then did a wide  excision down to the chest wall and went 1.5 cm in all directions around  the skin incision and completely excised the area around the guidewire.  There was a palpable approximately 1 cm mass that appeared to be well  within the middle of the lumpectomy specimen and tissue moved readily  over it in all directions so I thought that we had an adequate excision.  This was sent for specimen mammography.   While waiting for that, I turned my attention to the axilla and used a  Neoprobe to identify a hot area which was a little bit higher up than  average.  A transverse incision was made and the clavipectoral fascia  opened and I immediately identified a blue lymphatic entering the axilla  and as I traced that it out, it split into two separate columns of blue  dye.  I traced each of these out and identified two blue lymph nodes  both of which were excised.  They both had counts of around  500.  Once  they were removed, counts dropped down to about 10to 15 with no counts  above 20.  There were no other blue lymphatics, no blue lymph nodes, and  no palpably abnormal nodes noted.  I injected 0.25% plain Marcaine and  closed this incision in layers with 3-0 Vicryl and 4-0 Monocryl  subcuticular.   Attention was turned back to the breast incision which had remained dry  while we were working on the axilla.  I had already injected some  Marcaine in there prior to moving to the axilla and the area was then  ready for closure.  This was closed with 3-0 Vicryl subcu and 4-0  Monocryl subcuticular, again.  This would leave a little cavity but I  thought we had an excellent cosmetic result.  Once pathology reported  that the sentinel nodes were negative and radiology confirmed that we  had a nice specimen, the patient had Dermabond applied.  The patient  tolerated the procedure well and there were no operative complications.  All counts were correct.       Currie Paris, M.D.  Electronically Signed     CJS/MEDQ  D:  07/15/2006  T:  07/15/2006  Job:  [id number]

## 2010-11-13 NOTE — H&P (Signed)
NAME:  Jill Richardson, Jill Richardson                           ACCOUNT NO.:  [id number]   MEDICAL RECORD NO.:  [id number]                   PATIENT TYPE:  INP   LOCATION:  2899                                 FACILITY:  [facility]   PHYSICIAN:  [name], M.D.                   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   A very nice 55 year old right-handed white girl who has had increasing pain  since September.  I saw her in February.  She had pain in her back and in  her legs.  Bladder has not been affected.  Right leg is affected more than  her left.  MR and plain films demonstrate spondylolisthesis of L5 with a  grade 1 slip of L5-S1 and she is admitted for a fusion.   PAST MEDICAL HISTORY:  Significant for an ovarian cyst.  She had an  ovariectomy and tubal on the left side in 1997 and a laparotomy in 1997.   MEDICATIONS:  1. She has SVT for which she is on Lanoxin.  2. Daypro.  3. Soma.  4. Zocor 40 mg a day.  5. Vicodin on a p.r.n. basis.   ALLERGIES:  She had a Levonne Spiller reaction to [facility] but takes ibuprofen  without difficulty.   FAMILY HISTORY:  Mother is 56 in good health, father is deceased of cardiac  disease.   SOCIAL HISTORY:  She does not smoke or drink.  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   REVIEW OF SYSTEMS:  Remarkable for glasses, irregular pulses, leg pain when  she is walking, back pain, leg pain, joint pain and swelling.   PHYSICAL EXAMINATION:  HEENT:  Within normal limits.  NECK:  She has reasonable range of motion in her neck.  CHEST:  Clear.  CARDIAC:  Exam at this time is a regular rate and rhythm.  ABDOMEN:  Soft and nontender with no hepatosplenomegaly.  EXTREMITIES:  Without clubbing or cyanosis.  GENITOURINARY:  Exam is deferred.  Peripheral pulses are good.  NEUROLOGIC:   She awake, alert, and oriented.  Cranial nerves are intact.  Motor exam shows 5/5 strength throughout the upper and lower extremities.  Sensory deficit is described in her right L5 distribution.  Reflexes are 2  at the knees, 2 at the left ankle, 1 at the right.  Straight leg raise on  the left is negative, on the right is mildly positive for calf pain.   MR shows spondylolysis of L5 with a grade 1 slip of L5-S1 and modest disk  disease at 4-5.   CLINICAL IMPRESSION:  Lumbar spondylolysis with spondylolisthesis.  She has  tried conservative management with Celebrex and physical therapy and felt  that she is worse.  The plan is for a lumbar laminectomy and diskectomy,  posterior lumbar interbody fusion, and pedicle fixation at L5-S1.  The risks  and benefits of this approach have been discussed with her and she wishes to  proceed.                                                [name], M.D.    MWR/MEDQ  D:  11/06/2003  T:  11/06/2003  Job:  [id number]

## 2010-11-13 NOTE — Discharge Summary (Signed)
NAME:  Jill Richardson, GITTO                           ACCOUNT NO.:  [id number]   MEDICAL RECORD NO.:  [id number]                   PATIENT TYPE:  INP   LOCATION:  3030                                 FACILITY:  [facility]   PHYSICIAN:  Danae Orleans. Venetia Maxon, M.D.               DATE OF BIRTH:  1956/03/10   DATE OF ADMISSION:  11/06/2003  DATE OF DISCHARGE:  11/10/2003                                 DISCHARGE SUMMARY   REASON FOR ADMISSION:  1. Lumbosacral spondylosis.  2. Spondylolisthesis.  3. Cardiac dysrhythmia.   The patient is a patient of [name], M.D., and he performed surgery on  her.  She has a discharge summary.   Dictation canceled at this point.                                                Danae Orleans. Venetia Maxon, M.D.    JDS/MEDQ  D:  12/06/2003  T:  12/07/2003  Job:  [phone]

## 2010-11-13 NOTE — Discharge Summary (Signed)
NAME:  KARNA, ABED                           ACCOUNT NO.:  [id number]   MEDICAL RECORD NO.:  [id number]                   PATIENT TYPE:  INP   LOCATION:  3030                                 FACILITY:  [facility]   PHYSICIAN:  Danae Orleans. Venetia Maxon, M.D.               DATE OF BIRTH:  14-May-1956   DATE OF ADMISSION:  11/06/2003  DATE OF DISCHARGE:  11/10/2003                                 DISCHARGE SUMMARY   REASON FOR ADMISSION:  Spondylosis of L5 ____________   FINAL DIAGNOSIS:  Spondylosis of L5 ____________   HOSPITAL COURSE:  Ms. Zupko is a 55 year old with spondylosis of L5  _________ lumbar surgery __________  comorbidities ___________.  The  procedure was done with Ray threaded cages at the L5-S1 level ____________.  The patient tolerated ___________ following surgery she was gradually  mobilized and was up walking without difficulty.  She was doing well  ____________ and it was felt we could discharge her home at that point.   DISCHARGE MEDICATIONS:  1. Percocet __________ daily.  2. Valium __________.   ACTIVITY:  She was instructed __________ heavy lifting, bending, twisting,  or driving.   FOLLOWUP:  Follow up with Dr. Channing Mutters in the office next week for suture  removal.                                                Danae Orleans. Venetia Maxon, M.D.    JDS/MEDQ  D:  11/10/2003  T:  11/10/2003  Job:  [id number]

## 2010-11-13 NOTE — Op Note (Signed)
NAME:  Jill Richardson, Jill Richardson                           ACCOUNT NO.:  [id number]   MEDICAL RECORD NO.:  [id number]                   PATIENT TYPE:  INP   LOCATION:  3030                                 FACILITY:  [facility]   PHYSICIAN:  [name], M.D.                   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   OPERATIVE PROCEDURE:  L5-S1 Gill procedure, diskectomy, posterior lumbar  interbody fusion with Ray Threaded Fusion Cages, posterolateral arthrodesis  with Vitoss and bone marrow aspirate.   ANESTHESIA:  General endotracheal.   PREPARATION:  Sterile Betadine prep and scrub with alcohol wipe.   COMPLICATIONS:  None.   SURGEON:  [name], M.D.   NURSE ASSISTANT:  [facility].   DOCTOR ASSISTANT:  Stefani Dama, M.D.   BODY OF TEXT:  A 55 year old girl with spondylolysis with grade 1 slip.  She  was taken to the operating room and smoothly anesthetized and intubated,  placed prone on the operating table.  Following shave, prep and drape in the  usual sterile fashion, the skin was infiltrated with 1% lidocaine and  1:400,000 epinephrine.  Skin was incised from mid-S1 to mid-L4 and the  transverse processes and laminae and facets of L4-5 and L5-S1 were exposed  bilaterally in subperiosteal plane.  It was obvious that L5 had loose  posterior elements.  The lamina and inferior facet of L5 along with  defective pars were removed bilaterally.  This allowed dissection of the L5  roots and the S1 roots as they traversed the area.  After complete  dissection of the L5 and S1 nerve roots bilaterally, diskectomy was carried  out.  Ray Threaded Fusion Cages 12 x 21 mm were then placed at L5-S1.  They  were packed with bone graft harvested  from the facet joints.  Bone marrow  was aspirated from the right S1 pedicle and placed over the Vitoss bone  matrix.  This was placed in the intertransverse region as a posterolateral  arthrodesis.  The wound was irrigated and hemostasis assured.  The fascia  was reapproximated with 0 Vicryl in interrupted fashion, the subcutaneous  tissue was reapproximated with 0 Vicryl in interrupted fashion, the  subcuticular tissue was reapproximated with 3-0 Vicryl in interrupted  fashion.  The skin was closed with 3-0 nylon in running locked fashion.  A  Betadine and Telfa dressing was applied and made occlusive with OpSite, and  the patient returned to the recovery room in good condition.  [name], M.D.    MWR/MEDQ  D:  11/06/2003  T:  11/07/2003  Job:  [id number]

## 2010-11-25 ENCOUNTER — Encounter (INDEPENDENT_AMBULATORY_CARE_PROVIDER_SITE_OTHER): Payer: Self-pay | Admitting: Surgery

## 2010-12-01 ENCOUNTER — Encounter (HOSPITAL_BASED_OUTPATIENT_CLINIC_OR_DEPARTMENT_OTHER): Payer: Commercial Managed Care - PPO | Admitting: Oncology

## 2010-12-01 DIAGNOSIS — Z170 Estrogen receptor positive status [ER+]: Secondary | ICD-10-CM

## 2010-12-01 DIAGNOSIS — C50519 Malignant neoplasm of lower-outer quadrant of unspecified female breast: Secondary | ICD-10-CM

## 2010-12-02 ENCOUNTER — Other Ambulatory Visit: Payer: Self-pay | Admitting: Oncology

## 2010-12-02 DIAGNOSIS — Z1231 Encounter for screening mammogram for malignant neoplasm of breast: Secondary | ICD-10-CM

## 2010-12-17 ENCOUNTER — Other Ambulatory Visit: Payer: Commercial Managed Care - PPO | Admitting: Internal Medicine

## 2010-12-17 DIAGNOSIS — E785 Hyperlipidemia, unspecified: Secondary | ICD-10-CM

## 2010-12-17 DIAGNOSIS — Z79899 Other long term (current) drug therapy: Secondary | ICD-10-CM

## 2010-12-18 LAB — HEPATIC FUNCTION PANEL
Bilirubin, Direct: 0.1 mg/dL (ref 0.0–0.3)
Indirect Bilirubin: 0.3 mg/dL (ref 0.0–0.9)
Total Bilirubin: 0.4 mg/dL (ref 0.3–1.2)
Total Protein: 6.5 g/dL (ref 6.0–8.3)

## 2010-12-18 LAB — LIPID PANEL
LDL Cholesterol: 77 mg/dL (ref 0–99)
VLDL: 31 mg/dL (ref 0–40)

## 2010-12-21 ENCOUNTER — Ambulatory Visit (INDEPENDENT_AMBULATORY_CARE_PROVIDER_SITE_OTHER): Payer: Commercial Managed Care - PPO | Admitting: Internal Medicine

## 2010-12-21 ENCOUNTER — Encounter: Payer: Self-pay | Admitting: Internal Medicine

## 2010-12-21 VITALS — BP 122/78 | HR 82 | Temp 97.5°F | Ht 60.0 in | Wt 168.0 lb

## 2010-12-21 DIAGNOSIS — K219 Gastro-esophageal reflux disease without esophagitis: Secondary | ICD-10-CM

## 2010-12-21 DIAGNOSIS — M19049 Primary osteoarthritis, unspecified hand: Secondary | ICD-10-CM

## 2010-12-21 DIAGNOSIS — M67439 Ganglion, unspecified wrist: Secondary | ICD-10-CM

## 2010-12-21 DIAGNOSIS — Z853 Personal history of malignant neoplasm of breast: Secondary | ICD-10-CM

## 2010-12-21 DIAGNOSIS — E785 Hyperlipidemia, unspecified: Secondary | ICD-10-CM

## 2010-12-21 DIAGNOSIS — M797 Fibromyalgia: Secondary | ICD-10-CM

## 2010-12-21 DIAGNOSIS — M19039 Primary osteoarthritis, unspecified wrist: Secondary | ICD-10-CM

## 2010-12-21 DIAGNOSIS — Z8679 Personal history of other diseases of the circulatory system: Secondary | ICD-10-CM

## 2010-12-21 DIAGNOSIS — M6740 Ganglion, unspecified site: Secondary | ICD-10-CM

## 2010-12-21 DIAGNOSIS — IMO0001 Reserved for inherently not codable concepts without codable children: Secondary | ICD-10-CM

## 2010-12-21 DIAGNOSIS — M19032 Primary osteoarthritis, left wrist: Secondary | ICD-10-CM

## 2010-12-23 ENCOUNTER — Encounter: Payer: Self-pay | Admitting: Internal Medicine

## 2010-12-23 DIAGNOSIS — K219 Gastro-esophageal reflux disease without esophagitis: Secondary | ICD-10-CM | POA: Insufficient documentation

## 2010-12-23 DIAGNOSIS — M797 Fibromyalgia: Secondary | ICD-10-CM | POA: Insufficient documentation

## 2010-12-23 DIAGNOSIS — C50412 Malignant neoplasm of upper-outer quadrant of left female breast: Secondary | ICD-10-CM | POA: Insufficient documentation

## 2010-12-23 DIAGNOSIS — Z8679 Personal history of other diseases of the circulatory system: Secondary | ICD-10-CM | POA: Insufficient documentation

## 2010-12-23 DIAGNOSIS — E785 Hyperlipidemia, unspecified: Secondary | ICD-10-CM | POA: Insufficient documentation

## 2010-12-23 NOTE — Progress Notes (Signed)
  Subjective:    Patient ID: Jill Richardson, female    DOB: Nov 01, 1955, 55 y.o.   MRN: [id number]  HPI patient in today for six-month recheck with history of hyperlipidemia, history of breast cancer, GE reflux, fibromyalgia, and remote history of supraventricular tachycardia. Recently saw oncologist and remains on Femara 2.5 mg daily.Left breast cancer was diagnosed December 2007 showing invasive mammary carcinoma which appeared to have high nuclear grade with focal lymphatic space involvement. Tumor was strongly estrogen receptor and progesterone receptor positive . HER-2/neu. was 1+. Patient had lumpectomy with sentinel lymph node dissection January 2008. Pathology showed 1.4 cm grade 2/3 ductal cancer. Lymph node vascular invasion was seen. 2 sentinel lymph nodes were identified which were negative. Additional history had left oophorectomy in 1997 for a cyst. History of an exploratory laparotomy 6 weeks after her oophorectomy for adhesions. Had lumbar spinal fusion in 2005. Is working on graduate degree in nursing. She hopes to become a clinical nurse specialist in about 1-1/2 years. Continues to have some intermittent problems with her back particularly working full-time as a coronary care nurse. This is managed with generic Daypro and Flexeril at bedtime. She takes Effexor XR 37.5 mg for hot flashes. Remains on Crestor for hyperlipidemia without side effects. Also takes TriCor 145 mg daily for hypertriglyceridemia. Takes Ativan 2 mg at bedtime to sleep. It helps with fibromyalgia and insomnia. Her husband is disabled. She is the sole supporter of the family. Has 2 sons. Her husband was in an accident in the 1990s were H. refill on him. He had a head injury and underwent extensive rehabilitation. Patient doesn't get a lot of exercise other than at work. Had DTaP vaccine in 2009, gets annual influenza vaccine through work. Had colonoscopy in 2008. Gets annual mammograms. Recently has had trouble with painful  left wrist and palm due to arthritis. 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 She also was found to have a multilobular ganglion cyst that was very deep directly on the radiocarpal articulation deep to the flexor carpi radialis tendon and on the radial aspect of the carpal tunnel. Dr. Teressa Senter for his indicated he can inject her if symptoms become worse.     Review of Systems     Objective:   Physical Exam chest is clear to auscultation; cardiac exam regular rate and rhythm; normal S1 and S2; extremities without edema; neck is supple without thyromegaly, adenopathy, or carotid bruits. Neuro: Somewhat depressed affect.          Assessment & Plan:  1-hyperlipidemia stable on TriCor and Crestor recheck in 6 months at time of physical exam  2-history of breast cancer diagnosed 2007 followed by Dr. Pierce Crane on Femara.  3-fibromyalgia  4-arthritis and ganglion cyst left wrist and arthritis left thumb  5-GE reflux  6-remote history of supraventricular tachycardia  Plan return in 6 months or physical examination and fasting lab work.

## 2010-12-23 NOTE — Patient Instructions (Signed)
Continue with same medications. I am pleased with your lab results including total cholesterol, LDL cholesterol and triglycerides. Continue with same regimen. Please book physical exam for 6 months. Please try to find time to exercise.

## 2011-02-17 ENCOUNTER — Other Ambulatory Visit: Payer: Self-pay | Admitting: Obstetrics and Gynecology

## 2011-02-24 ENCOUNTER — Encounter (INDEPENDENT_AMBULATORY_CARE_PROVIDER_SITE_OTHER): Payer: Self-pay | Admitting: General Surgery

## 2011-02-25 ENCOUNTER — Encounter (INDEPENDENT_AMBULATORY_CARE_PROVIDER_SITE_OTHER): Payer: Self-pay | Admitting: Surgery

## 2011-02-26 ENCOUNTER — Encounter (INDEPENDENT_AMBULATORY_CARE_PROVIDER_SITE_OTHER): Payer: Self-pay | Admitting: Surgery

## 2011-02-26 ENCOUNTER — Ambulatory Visit (INDEPENDENT_AMBULATORY_CARE_PROVIDER_SITE_OTHER): Payer: Commercial Managed Care - PPO | Admitting: Surgery

## 2011-02-26 VITALS — BP 142/96 | HR 72

## 2011-02-26 DIAGNOSIS — Z853 Personal history of malignant neoplasm of breast: Secondary | ICD-10-CM

## 2011-02-26 NOTE — Progress Notes (Signed)
02/26/2011  Jill Richardson is a 55 y.o.female who presents for routine followup of her Left breast cancer diagnosed in 2007  and treated with Lumpectomy and radiaton. She has no problems or concerns on either side.  PFSH She has had no significant changes since the last visit here.  ROS There have been no significant changes since the last visit here  General: The patient is alert, oriented, generally healty appearing, NAD. Mood and affect are normal.  Breasts: The breasts are bilaterally diffusely irregular. There basically symmetric in appearance. The lumpectomy site in the 6:00 position on the left, shows a loss of tissue to palpation. There is no obvious dominant mass. However these breasts are so a regular is difficult to feel comfortable about the exam. However, I don't think there has been any change since her last visit. Lymphatics: She has no axillary or supraclavicular adenopathy on either side.  Extremities: Full ROM of the surgical side with no lymphedema noted.  Data Reviewed: Last mammogram was Nov 2011, negative  Impression: Doing well, with no evidence of recurrent cancer or new cancer  Plan: We will see her back here p.r.n. She is now almost 5 years out from her original diagnosis.  I strongly recommend that she have an MRI at her next mammogram. Her breasts are very difficult to examine and very irregular and I think that would be very helpful in terms of being sure we don't miss any recurrences were no cancers.

## 2011-02-26 NOTE — Patient Instructions (Signed)
Ask Dr Donnie Coffin about an MRI after your next mammogram. I would suggest getting one because your breasts are dense and difficult to examine.

## 2011-05-11 ENCOUNTER — Other Ambulatory Visit: Payer: Self-pay | Admitting: Obstetrics and Gynecology

## 2011-05-21 ENCOUNTER — Ambulatory Visit
Admission: RE | Admit: 2011-05-21 | Discharge: 2011-05-21 | Disposition: A | Discharge status: Home / Self Care | Payer: Commercial Managed Care - PPO | Source: Ambulatory Visit | Attending: Oncology | Admitting: Oncology

## 2011-05-21 DIAGNOSIS — Z1231 Encounter for screening mammogram for malignant neoplasm of breast: Secondary | ICD-10-CM

## 2011-05-26 ENCOUNTER — Other Ambulatory Visit: Payer: Commercial Managed Care - PPO | Admitting: Lab

## 2011-06-04 ENCOUNTER — Telehealth: Payer: Self-pay | Admitting: *Deleted

## 2011-06-04 NOTE — Telephone Encounter (Signed)
cancelled patient per the patient's request

## 2011-06-08 ENCOUNTER — Ambulatory Visit: Payer: Commercial Managed Care - PPO | Admitting: Oncology

## 2011-06-23 ENCOUNTER — Ambulatory Visit: Payer: Commercial Managed Care - PPO | Admitting: Oncology

## 2011-06-24 ENCOUNTER — Other Ambulatory Visit: Payer: Self-pay | Admitting: Oncology

## 2011-06-24 ENCOUNTER — Other Ambulatory Visit (HOSPITAL_BASED_OUTPATIENT_CLINIC_OR_DEPARTMENT_OTHER): Payer: Commercial Managed Care - PPO | Admitting: Lab

## 2011-06-24 ENCOUNTER — Other Ambulatory Visit: Payer: Commercial Managed Care - PPO | Admitting: Internal Medicine

## 2011-06-24 DIAGNOSIS — E7800 Pure hypercholesterolemia, unspecified: Secondary | ICD-10-CM

## 2011-06-24 DIAGNOSIS — Z0000 Encounter for general adult medical examination without abnormal findings: Secondary | ICD-10-CM

## 2011-06-24 DIAGNOSIS — C50519 Malignant neoplasm of lower-outer quadrant of unspecified female breast: Secondary | ICD-10-CM

## 2011-06-24 LAB — COMPREHENSIVE METABOLIC PANEL
AST: 45 U/L — ABNORMAL HIGH (ref 0–37)
Albumin: 4 g/dL (ref 3.5–5.2)
Albumin: 3.6 g/dL (ref 3.5–5.2)
Alkaline Phosphatase: 46 U/L (ref 39–117)
CO2: 24 mEq/L (ref 19–32)
Calcium: 9.3 mg/dL (ref 8.4–10.5)
Calcium: 9.6 mg/dL (ref 8.4–10.5)
Chloride: 106 mEq/L (ref 96–112)
Chloride: 104 mEq/L (ref 96–112)
Glucose, Bld: 97 mg/dL (ref 70–99)
Glucose, Bld: 155 mg/dL — ABNORMAL HIGH (ref 70–99)
Potassium: 4.2 mEq/L (ref 3.5–5.3)
Potassium: 3.8 mEq/L (ref 3.5–5.3)
Sodium: 141 mEq/L (ref 135–145)
Sodium: 139 mEq/L (ref 135–145)
Total Protein: 6.8 g/dL (ref 6.0–8.3)
Total Protein: 7.1 g/dL (ref 6.0–8.3)

## 2011-06-24 LAB — CBC WITH DIFFERENTIAL/PLATELET
Basophils Absolute: 0 10*3/uL (ref 0.0–0.1)
Basophils Relative: 1 % (ref 0–1)
Eosinophils Absolute: 0.5 10*3/uL (ref 0.0–0.7)
Eosinophils Absolute: 0.4 10*3/uL (ref 0.0–0.5)
HGB: 12 g/dL (ref 11.6–15.9)
Hemoglobin: 12.4 g/dL (ref 12.0–15.0)
Lymphs Abs: 2.5 10*3/uL (ref 0.7–4.0)
MCHC: 31.4 g/dL (ref 30.0–36.0)
MONO#: 0.4 10*3/uL (ref 0.1–0.9)
Monocytes Relative: 7 % (ref 3–12)
NEUT#: 3.7 10*3/uL (ref 1.5–6.5)
Neutro Abs: 3 10*3/uL (ref 1.7–7.7)
Neutrophils Relative %: 47 % (ref 43–77)
Platelets: 327 10*3/uL (ref 150–400)
RBC: 4.39 MIL/uL (ref 3.87–5.11)
RBC: 4.17 10*6/uL (ref 3.70–5.45)
RDW: 16 % — ABNORMAL HIGH (ref 11.2–14.5)
WBC: 6.4 10*3/uL (ref 3.9–10.3)

## 2011-06-24 LAB — TSH: TSH: 2.622 u[IU]/mL (ref 0.350–4.500)

## 2011-06-24 LAB — LIPID PANEL
LDL Cholesterol: 65 mg/dL (ref 0–99)
Triglycerides: 119 mg/dL (ref <150)

## 2011-06-24 LAB — LACTATE DEHYDROGENASE: LDH: 267 U/L — ABNORMAL HIGH (ref 94–250)

## 2011-06-25 ENCOUNTER — Encounter: Payer: Self-pay | Admitting: Internal Medicine

## 2011-06-25 ENCOUNTER — Ambulatory Visit (INDEPENDENT_AMBULATORY_CARE_PROVIDER_SITE_OTHER): Payer: Commercial Managed Care - PPO | Admitting: Internal Medicine

## 2011-06-25 VITALS — BP 148/84 | HR 80 | Temp 97.7°F | Ht 61.25 in | Wt 177.0 lb

## 2011-06-25 DIAGNOSIS — Z8679 Personal history of other diseases of the circulatory system: Secondary | ICD-10-CM

## 2011-06-25 DIAGNOSIS — E785 Hyperlipidemia, unspecified: Secondary | ICD-10-CM

## 2011-06-25 DIAGNOSIS — Z8719 Personal history of other diseases of the digestive system: Secondary | ICD-10-CM

## 2011-06-25 DIAGNOSIS — Z0000 Encounter for general adult medical examination without abnormal findings: Secondary | ICD-10-CM

## 2011-06-25 DIAGNOSIS — M797 Fibromyalgia: Secondary | ICD-10-CM

## 2011-06-25 DIAGNOSIS — Z853 Personal history of malignant neoplasm of breast: Secondary | ICD-10-CM

## 2011-06-25 DIAGNOSIS — IMO0001 Reserved for inherently not codable concepts without codable children: Secondary | ICD-10-CM

## 2011-06-25 LAB — POCT URINALYSIS DIPSTICK
Blood, UA: NEGATIVE
Glucose, UA: NEGATIVE
Ketones, UA: NEGATIVE
Protein, UA: NEGATIVE
Spec Grav, UA: 1.015
Urobilinogen, UA: NEGATIVE

## 2011-06-25 LAB — CANCER ANTIGEN 27.29: CA 27.29: 19 U/mL (ref 0–39)

## 2011-06-26 NOTE — Patient Instructions (Signed)
Continue same medications. Return in 6 months for fasting lipid panel liver functions and office visit. Try to lose weight and exercise.

## 2011-06-26 NOTE — Progress Notes (Signed)
Subjective:    Patient ID: Jill Richardson, female    DOB: March 27, 1956, 55 y.o.   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 She should be finished with her education in about a year. History of SVT for which she takes Lanoxin, history of fibromyalgia syndrome treated with Flexeril and Daypro. History of hyperlipidemia and history of breast cancer. Had left oophorectomy 1997 for a cyst. About 6 weeks after a per rectum he had exploratory laparotomy for GE shows. History of lumbar fusion 2005. History of left thumb [facility] arthrosis. Was diagnosed December 2007 with invasive mammary carcinoma which appeared to have high nuclear grade with focal lymphatic space involvement. Tumor was strongly 80 R. and PR positive at 100% respectively. HER-2/neu was 1+. Mass was an inferior left breast. She underwent lumpectomy with sentinel node evaluation January 2008. Final pathology showed a 1.4 cm grade 2/3 ductal cancer. Lymph node vascular invasion seen. 2 sentinel lymph nodes were identified both of which were negative for malignancy. She had radiation therapy. Currently on Femara. Takes Effexor for hot flashes. Takes Ativan at night for insomnia. Unable to tolerate Lipitor because of myalgias. She is unable to tolerate Crestor and TriCor. History of Stevens-Johnson syndrome on Lodine.  Father died of complications of heart failure. 2 sisters and one brother in good health. 2 adult children. Patient is married. Husband had head injury many years ago and is on disability.  Patient does not smoke or consume alcohol.    Review of Systems  Constitutional: Positive for fatigue.  HENT: Negative.   Eyes: Negative.   Respiratory: Negative.   Cardiovascular: Negative.   Gastrointestinal:       History of GE reflux  Genitourinary:       Hot flashes stable on Effexor  Musculoskeletal: Positive for back pain.    Neurological: Negative.   Hematological: Negative.   Psychiatric/Behavioral:       History of depression       Objective:   Physical Exam  Vitals reviewed. Constitutional: She is oriented to person, place, and time. She appears well-developed and well-nourished.  HENT:  Head: Normocephalic and atraumatic.  Right Ear: External ear normal.  Left Ear: External ear normal.  Mouth/Throat: Oropharynx is clear and moist.  Eyes: EOM are normal. Pupils are equal, round, and reactive to light. No scleral icterus.  Neck: Neck supple. No JVD present. No thyromegaly present.  Cardiovascular: Normal rate, regular rhythm, normal heart sounds and intact distal pulses.   No murmur heard. Pulmonary/Chest: Effort normal and breath sounds normal. She has no wheezes.  Abdominal: Soft. Bowel sounds are normal. She exhibits no distension and no mass. There is no tenderness. There is no guarding.  Genitourinary:       Deferred  Musculoskeletal: She exhibits no edema.  Lymphadenopathy:    She has no cervical adenopathy.  Neurological: She is alert and oriented to person, place, and time. She has normal reflexes. No cranial nerve deficit. Coordination normal.  Skin: Skin is warm and dry. No rash noted.  Psychiatric: She has a normal mood and affect. Her behavior is normal. Judgment and thought content normal.          Assessment & Plan:  History of breast cancer  Fibromyalgia  History of GE reflux  History of hyperlipidemia  History of SVT controlled with digoxin  History of insomnia  History of depression  Plan:  Continue with same medications which were written today for when necessary one year. Return in 6 months for fasting lipid panel liver functions and office visit. Try to diet, exercise and lose weight.

## 2011-07-01 ENCOUNTER — Ambulatory Visit (HOSPITAL_BASED_OUTPATIENT_CLINIC_OR_DEPARTMENT_OTHER): Payer: Commercial Managed Care - PPO | Admitting: Oncology

## 2011-07-01 VITALS — BP 171/90 | HR 91 | Temp 98.4°F | Ht 61.25 in | Wt 178.3 lb

## 2011-07-01 DIAGNOSIS — Z853 Personal history of malignant neoplasm of breast: Secondary | ICD-10-CM

## 2011-07-02 NOTE — Progress Notes (Signed)
Hematology and Oncology Follow Up Visit  Jill Richardson [id number] February 24, 1956 56 y.o. 07/02/2011 12:31 AM PCP [name]  Principle Diagnosis: :  T1c N0, ER PR positive breast cancer status post radiation therapy and lumpectomy, radiation therapy completed 10/19/2006.  Previously on tamoxifen, now on Femara and Effexor.  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  Medications: I have reviewed the patient's current medications.  Allergies:  Allergies  Allergen Reactions  . Lipitor (Atorvastatin Calcium)   . Lodine (Etodolac)     Past Medical History, Surgical history, Social history, and Family History were reviewed and updated.  Review of Systems: Constitutional:  Negative for fever, chills, night sweats, anorexia, weight loss, pain. Cardiovascular: no chest pain or dyspnea on exertion Respiratory: no cough, shortness of breath, or wheezing Neurological: negative Dermatological: negative ENT: negative Skin Gastrointestinal: no abdominal pain, change in bowel habits, or black or bloody stools Genito-Urinary: no dysuria, trouble voiding, or hematuria Hematological and Lymphatic: negative Breast: negative for breast lumps Musculoskeletal: negative Remaining ROS negative, tolerating AI, w/out significant HF or pain.  Physical Exam: Blood pressure 171/90, pulse 91, temperature 98.4 F (36.9 C), height 5' 1.25" (1.556 m), weight 178 lb 4.8 oz (80.876 kg). ECOG: 0 General appearance: alert, cooperative and appears stated age Head: Normocephalic, without obvious abnormality, atraumatic Neck: no adenopathy, no carotid bruit, no JVD, supple, symmetrical, trachea midline and thyroid not enlarged, symmetric, no tenderness/mass/nodules Lymph nodes: Cervical, supraclavicular, and axillary nodes normal. Cardiac : regular rate and rhythm, no murmurs or  gallops Pulmonary:clear to auscultation bilaterally and normal percussion bilaterally Breasts: inspection negative, no nipple discharge or bleeding, no masses or nodularity palpable Abdomen:soft, non-tender; bowel sounds normal; no masses,  no organomegaly Extremities negative Neuro: alert, oriented, normal speech, no focal findings or movement disorder noted  Lab Results: Lab Results  Component Value Date   WBC 6.4 06/24/2011   HGB 12.0 06/24/2011   HCT 36.5 06/24/2011   MCV 87.7 06/24/2011   PLT 305 06/24/2011     Chemistry      Component Value Date/Time   NA 139 06/24/2011 1608   K 3.8 06/24/2011 1608   CL 104 06/24/2011 1608   CO2 24 06/24/2011 1608   BUN 19 06/24/2011 1608   CREATININE 0.83 06/24/2011 1608   CREATININE 0.87 06/24/2011 0911      Component Value Date/Time   CALCIUM 9.6 06/24/2011 1608   ALKPHOS 53 06/24/2011 1608   AST 47* 06/24/2011 1608   ALT 57* 06/24/2011 1608   BILITOT 0.2* 06/24/2011 1608     Vit d 63- excellent.. .pathology. Radiological Studies: chest X-ray n/a Mammogram 11/12- wnl Bone density 05/18/10-wnl  Impression and Plan: Doing well, discussed prolonged AI course, given initial 2 yr tamoxifen therapy.. She agrees , f/u 6 months.  More than 50% of the visit was spent in patient-related counselling   Pierce Crane, MD 1/4/201312:31 AM

## 2011-07-21 ENCOUNTER — Other Ambulatory Visit: Payer: Self-pay | Admitting: Oncology

## 2011-07-21 DIAGNOSIS — Z853 Personal history of malignant neoplasm of breast: Secondary | ICD-10-CM

## 2011-07-29 ENCOUNTER — Ambulatory Visit (INDEPENDENT_AMBULATORY_CARE_PROVIDER_SITE_OTHER): Payer: Commercial Managed Care - PPO | Admitting: Internal Medicine

## 2011-07-29 ENCOUNTER — Encounter: Payer: Self-pay | Admitting: Internal Medicine

## 2011-07-29 VITALS — BP 126/82 | HR 92 | Temp 98.3°F | Wt 174.0 lb

## 2011-07-29 DIAGNOSIS — J069 Acute upper respiratory infection, unspecified: Secondary | ICD-10-CM

## 2011-07-29 DIAGNOSIS — H6690 Otitis media, unspecified, unspecified ear: Secondary | ICD-10-CM

## 2011-08-13 ENCOUNTER — Other Ambulatory Visit: Payer: Self-pay

## 2011-08-13 ENCOUNTER — Telehealth: Payer: Self-pay | Admitting: Internal Medicine

## 2011-08-13 MED ORDER — CLARITHROMYCIN 500 MG PO TABS: 500.0000 mg | ORAL_TABLET | Freq: Two times a day (BID) | ORAL | Status: AC

## 2011-08-13 NOTE — Telephone Encounter (Signed)
Pt was prescribed Augmentin for 10 days and finished the full course and finished it Sunday, 08/08/11.  She is taking hydrocodone syrup for cough.  It doesn't help much, but she still has some of it.  She took a 7 day prednisone dosepack.  Still has laryngitis and cough.  Bringing up green stuff when she coughs.

## 2011-08-13 NOTE — Telephone Encounter (Signed)
Please call patient and see what she has left ie how much cough syrup, did she take steroids and how much/what antibiotic. Do not have enough info to advise.

## 2011-08-13 NOTE — Telephone Encounter (Signed)
Pt given rx for Biaxin 500 mg #20 1 po bid with food with no refills.  Electronically sent to [facility].  Pt aware.

## 2011-09-24 NOTE — Progress Notes (Signed)
  Subjective:    Patient ID: Jill Richardson, female    DOB: 1956/01/18, 56 y.o.   MRN: [id number]  HPI Patient has had cough congestion and right earache for 2 days. Has has had malaise and fatigue. No documented fever today. No shaking chills.    Review of Systems     Objective:   Physical Exampharynx is injected without exudate. Right TM is pain in full, left TM full but not red. Neck is supple. Chest clear.        Assessment & Plan:  Otitis media  Upper respiratory infection  Plan: Augmentin 500 mg by mouth 3 times daily for 10 days. Sterapred DS 10 mg 6 day dosepak. Hycodan 8 ounces 1 teaspoon every 6 hours as needed for cough

## 2011-09-24 NOTE — Patient Instructions (Signed)
Take Augmentin 3 times daily with food for 10 days. Take Hycodan as needed for cough every 6 hours. Take Sterapred DS 10 mg 6 day dosepak as directed.

## 2011-09-29 ENCOUNTER — Other Ambulatory Visit: Payer: Self-pay | Admitting: Oncology

## 2011-09-29 DIAGNOSIS — C50919 Malignant neoplasm of unspecified site of unspecified female breast: Secondary | ICD-10-CM

## 2011-11-09 ENCOUNTER — Ambulatory Visit (INDEPENDENT_AMBULATORY_CARE_PROVIDER_SITE_OTHER): Payer: Commercial Managed Care - PPO | Admitting: Internal Medicine

## 2011-11-09 DIAGNOSIS — E119 Type 2 diabetes mellitus without complications: Secondary | ICD-10-CM

## 2011-11-09 LAB — POCT URINALYSIS DIPSTICK
Bilirubin, UA: NEGATIVE
Blood, UA: NEGATIVE
Ketones, UA: NEGATIVE
Leukocytes, UA: NEGATIVE
Protein, UA: NEGATIVE
Spec Grav, UA: 1.01
pH, UA: 6

## 2011-11-10 LAB — COMPREHENSIVE METABOLIC PANEL
Albumin: 4.2 g/dL (ref 3.5–5.2)
BUN: 21 mg/dL (ref 6–23)
CO2: 23 mEq/L (ref 19–32)
Glucose, Bld: 398 mg/dL — ABNORMAL HIGH (ref 70–99)
Potassium: 4.3 mEq/L (ref 3.5–5.3)
Sodium: 132 mEq/L — ABNORMAL LOW (ref 135–145)
Total Protein: 6.8 g/dL (ref 6.0–8.3)

## 2011-11-12 ENCOUNTER — Telehealth: Payer: Self-pay | Admitting: Internal Medicine

## 2011-11-12 NOTE — Telephone Encounter (Signed)
Continue sliding scale a couple more days if necessary

## 2011-11-12 NOTE — Telephone Encounter (Signed)
Continue sliding scale a couple more days if necessary . Goal is 130 without insulin and just with oral med.

## 2011-11-12 NOTE — Telephone Encounter (Signed)
5/17 @ 0700, 230, 5u.  Please advise of what you would like for her to do at this point.

## 2011-11-28 ENCOUNTER — Encounter: Payer: Self-pay | Admitting: Internal Medicine

## 2011-11-28 DIAGNOSIS — E119 Type 2 diabetes mellitus without complications: Secondary | ICD-10-CM | POA: Insufficient documentation

## 2011-11-28 NOTE — Patient Instructions (Signed)
Patient is to start sliding-scale insulin as directed. Is to call me in 3 days with Accu-Cheks.

## 2011-11-28 NOTE — Progress Notes (Signed)
  Subjective:    Patient ID: Jill Richardson, female    DOB: Jun 22, 1956, 56 y.o.   MRN: [id number]  HPI 56 year old white female says she has been noticing polyuria nail for a few weeks. Has lost some weight. Didn't really think much about it because she was busy with work and school but recently did an Accu-Chek on herself at work and found it to be significantly elevated. She is here today for treatment of new onset diabetes mellitus.    Review of Systems     Objective:   Physical Exam chest clear to auscultation; cardiac exam regular rate and rhythm; extremities without edema. Lower extremity pulses are normal. Diabetic foot exam is negative for ulcers.        Assessment & Plan:  New onset diabetes mellitus. There is no evidence of infection triggering this.  Plan: Patient is to start sliding-scale insulin as directed. She will try diet and exercise. She will call me with Accu-Cheks in a few days. We will check electrolytes. She will need to have annual eye exam.

## 2011-12-07 ENCOUNTER — Other Ambulatory Visit: Payer: Self-pay | Admitting: Internal Medicine

## 2011-12-22 ENCOUNTER — Encounter: Payer: Self-pay | Admitting: Dietician

## 2011-12-22 ENCOUNTER — Encounter: Payer: 59 | Attending: Internal Medicine | Admitting: Dietician

## 2011-12-22 NOTE — Progress Notes (Signed)
  Patient was seen on 12/22/2011 for the first of a series of three diabetes self-management courses at the [facility]. The following learning objectives were met by the patient during this course:   Defines the role of glucose and insulin  Identifies type of diabetes and pathophysiology  Defines the diagnostic criteria for diabetes and prediabetes  States the risk factors for Type 2 Diabetes  States the symptoms of Type 2 Diabetes  Defines Type 2 Diabetes treatment goals  Defines Type 2 Diabetes treatment options  States the rationale for glucose monitoring  Identifies A1C, glucose targets, and testing times  Identifies proper sharps disposal  Defines the purpose of a diabetes food plan  Identifies carbohydrate food groups  Defines effects of carbohydrate foods on glucose levels  Identifies carbohydrate choices/grams/food labels  States benefits of physical activity and effect on glucose  Review of suggested activity guidelines  HgA1C 13.7% (12/07/2011)  Handouts given during class include:  Type 2 Diabetes: Basics Book  My Food Plan Book  Food and Activity Log  Patient has established the following initial goals:  Increase exercise  Work on stress levels  Lose weight  Follow-Up Plan: Attend the Diabetes Self-management Core Classes

## 2011-12-23 ENCOUNTER — Ambulatory Visit: Payer: Commercial Managed Care - PPO | Admitting: Internal Medicine

## 2011-12-24 ENCOUNTER — Ambulatory Visit (INDEPENDENT_AMBULATORY_CARE_PROVIDER_SITE_OTHER): Payer: Commercial Managed Care - PPO | Admitting: Internal Medicine

## 2011-12-24 ENCOUNTER — Encounter: Payer: Self-pay | Admitting: Internal Medicine

## 2011-12-24 VITALS — BP 136/86 | HR 80 | Temp 97.6°F | Ht 61.25 in | Wt 153.0 lb

## 2011-12-24 DIAGNOSIS — E119 Type 2 diabetes mellitus without complications: Secondary | ICD-10-CM

## 2011-12-24 DIAGNOSIS — I498 Other specified cardiac arrhythmias: Secondary | ICD-10-CM

## 2011-12-24 DIAGNOSIS — Z79899 Other long term (current) drug therapy: Secondary | ICD-10-CM

## 2011-12-24 DIAGNOSIS — I471 Supraventricular tachycardia: Secondary | ICD-10-CM

## 2011-12-24 DIAGNOSIS — Z23 Encounter for immunization: Secondary | ICD-10-CM

## 2011-12-24 DIAGNOSIS — Z853 Personal history of malignant neoplasm of breast: Secondary | ICD-10-CM

## 2011-12-24 DIAGNOSIS — E785 Hyperlipidemia, unspecified: Secondary | ICD-10-CM

## 2011-12-24 LAB — HEPATIC FUNCTION PANEL
Albumin: 4.2 g/dL (ref 3.5–5.2)
Total Bilirubin: 0.4 mg/dL (ref 0.3–1.2)
Total Protein: 6.6 g/dL (ref 6.0–8.3)

## 2011-12-24 LAB — LIPID PANEL
HDL: 48 mg/dL (ref >39)
LDL Cholesterol: 65 mg/dL (ref 0–99)
Total CHOL/HDL Ratio: 2.8 Ratio
Triglycerides: 110 mg/dL (ref <150)
VLDL: 22 mg/dL (ref 0–40)

## 2011-12-24 LAB — HEMOGLOBIN A1C
Hgb A1c MFr Bld: 10.5 % — ABNORMAL HIGH (ref <5.7)
Mean Plasma Glucose: 255 mg/dL — ABNORMAL HIGH (ref <117)

## 2011-12-24 MED ORDER — PNEUMOCOCCAL VAC POLYVALENT 25 MCG/0.5ML IJ INJ: 0.5000 mL | INJECTION | Freq: Once | INTRAMUSCULAR | Status: DC

## 2011-12-24 NOTE — Progress Notes (Signed)
  Subjective:    Patient ID: Jill Richardson, female    DOB: 1955-11-10, 56 y.o.   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 She is on Januvia 100 mg daily.  Has been to nutritional counseling through [facility]. She has had her eyes checked recently and actually had to get a different eyeglass prescription. This was about 2 weeks ago. Continues in graduate school. Plans to be finished with school by December. Still working full-time job. Says she's lost a good deal of weight. Fasting labs including lipid panel liver functions and hemoglobin A1c are pending. Patient was diagnosed with new onset diabetes 11/09/2011. At that time she weighed 160 pounds but was having significant polyuria. Previous weight in January 2013 was 174 pounds so she's lost approximately 20 pounds since January 2013. She does watch her carbs carefully. Says she feels much better than she did in May. When she was diagnosed hemoglobin A1c was 14.8%. Fasting lipid panel liver functions digoxin level and hemoglobin A1c drawn today. Also has remote history of breast cancer and is doing well from that. Remote history of SVT for which she takes digoxin.    Review of Systems     Objective:   Physical Exam HEENT exam: TMs are clear. Pharynx is clear. Neck is supple without thyromegaly, JVD or bruits. Chest clear. Cardiac exam regular rate and rhythm normal S1 and S2. Extremities without edema. Diabetic foot exam shows pulses to be normal in the feet. No evidence of ulcers or tinea infection. Alert and oriented x3. Neuro no focal deficits on brief exam.        Assessment & Plan:  Onset diabetes mellitus diagnosed may 2013. Now on Januvia 100 mg daily without complaints. Watching diet strictly. Has lost approximately 20 pounds since diagnosis. Hemoglobin A1c is pending  Hyperlipidemia-fasting lipid panel liver functions pending on  current regimen  History of breast cancer-stable  History of SVT-treated with digoxin. Digoxin level drawn.  Plan: Return in 4 months for office visit in hemoglobin A 1C. Continue followup with [facility]. Pneumovax immunization given today.

## 2011-12-24 NOTE — Patient Instructions (Addendum)
Keep up excellent work with diabetes control. Hemoglobin A1c pending. Continue same medications. Continue diet and exercise. Recheck  office visit in 4 months with repeat hemoglobin A1c. You have been given Pneumovax immunization today.

## 2011-12-25 LAB — DIGOXIN LEVEL: Digoxin Level: 1.2 ng/mL (ref 0.8–2.0)

## 2011-12-29 ENCOUNTER — Other Ambulatory Visit: Payer: Commercial Managed Care - PPO | Admitting: Lab

## 2012-01-03 ENCOUNTER — Ambulatory Visit (INDEPENDENT_AMBULATORY_CARE_PROVIDER_SITE_OTHER): Payer: Commercial Managed Care - PPO | Admitting: Internal Medicine

## 2012-01-03 ENCOUNTER — Encounter: Payer: Self-pay | Admitting: Internal Medicine

## 2012-01-03 VITALS — BP 116/84 | HR 80 | Temp 97.8°F | Wt 157.0 lb

## 2012-01-03 DIAGNOSIS — Z853 Personal history of malignant neoplasm of breast: Secondary | ICD-10-CM

## 2012-01-03 DIAGNOSIS — R590 Localized enlarged lymph nodes: Secondary | ICD-10-CM

## 2012-01-03 DIAGNOSIS — R599 Enlarged lymph nodes, unspecified: Secondary | ICD-10-CM

## 2012-01-03 DIAGNOSIS — E119 Type 2 diabetes mellitus without complications: Secondary | ICD-10-CM

## 2012-01-03 DIAGNOSIS — E785 Hyperlipidemia, unspecified: Secondary | ICD-10-CM

## 2012-01-03 NOTE — Progress Notes (Signed)
  Subjective:    Patient ID: Jill Richardson, female    DOB: [birth year], 56 y.o.   MRN: [id number]  HPI  56 year old white female in today for evaluation who noticed recently a small nodule in her right medial axilla. Says it's tender to touch. Denies any recent illness, fever, chills. No URI symptoms or sore throat. Became alarmed because of history of breast cancer. Has appointment to see oncologist soon. Patient wanted area evaluated today by primary care. History of left breast cancer.    Review of Systems     Objective:   Physical Exam right breast: No masses or nipple discharges. Right axilla: approximate 1.5 cm nodule right media axillary area deep to palpation. Slightly tender to palpation.  TMs and pharynx are clear. Neck is supple without adenopathy. Chest clear to auscultation.        Assessment & Plan:  Probable benign right axillary lymph node-patient has been under a lot of stress with mother who has had to have an emergent hemi-colectomy and is hospitalized at the present time.  History of left breast cancer  Hyperlipidemia  Diabetes mellitus  Fibromyalgia syndrome  Plan: Patient is to let oncologist palpate this area and make further recommendations when she sees him next week. I think we can safely wait and see if nodule disappears over the next couple of weeks.

## 2012-01-03 NOTE — Patient Instructions (Addendum)
Call if nodule has not resolved in 2 weeks. See oncologist next week for your regular appointment and let him palpate area.

## 2012-01-05 ENCOUNTER — Other Ambulatory Visit: Payer: Commercial Managed Care - PPO | Admitting: Lab

## 2012-01-07 ENCOUNTER — Other Ambulatory Visit (HOSPITAL_BASED_OUTPATIENT_CLINIC_OR_DEPARTMENT_OTHER): Payer: Commercial Managed Care - PPO | Admitting: Lab

## 2012-01-07 DIAGNOSIS — Z853 Personal history of malignant neoplasm of breast: Secondary | ICD-10-CM

## 2012-01-07 LAB — CBC WITH DIFFERENTIAL/PLATELET
Basophils Absolute: 0.1 10*3/uL (ref 0.0–0.1)
Eosinophils Absolute: 0.2 10*3/uL (ref 0.0–0.5)
HGB: 12.4 g/dL (ref 11.6–15.9)
MCV: 86.6 fL (ref 79.5–101.0)
MONO#: 0.4 10*3/uL (ref 0.1–0.9)
MONO%: 6.2 % (ref 0.0–14.0)
NEUT#: 3.6 10*3/uL (ref 1.5–6.5)
Platelets: 327 10*3/uL (ref 145–400)
RBC: 4.45 10*6/uL (ref 3.70–5.45)
RDW: 15.6 % — ABNORMAL HIGH (ref 11.2–14.5)
WBC: 5.7 10*3/uL (ref 3.9–10.3)

## 2012-01-08 LAB — COMPREHENSIVE METABOLIC PANEL
Albumin: 4 g/dL (ref 3.5–5.2)
Alkaline Phosphatase: 32 U/L — ABNORMAL LOW (ref 39–117)
BUN: 21 mg/dL (ref 6–23)
CO2: 25 mEq/L (ref 19–32)
Calcium: 9.4 mg/dL (ref 8.4–10.5)
Glucose, Bld: 80 mg/dL (ref 70–99)
Potassium: 4.5 mEq/L (ref 3.5–5.3)
Sodium: 140 mEq/L (ref 135–145)
Total Protein: 6.8 g/dL (ref 6.0–8.3)

## 2012-01-08 LAB — VITAMIN D 25 HYDROXY (VIT D DEFICIENCY, FRACTURES): Vit D, 25-Hydroxy: 68 ng/mL (ref 30–89)

## 2012-01-12 ENCOUNTER — Telehealth: Payer: Self-pay | Admitting: *Deleted

## 2012-01-12 ENCOUNTER — Ambulatory Visit (HOSPITAL_BASED_OUTPATIENT_CLINIC_OR_DEPARTMENT_OTHER): Payer: Commercial Managed Care - PPO | Admitting: Oncology

## 2012-01-12 VITALS — BP 127/74 | HR 79 | Temp 98.3°F | Ht 61.25 in | Wt 156.3 lb

## 2012-01-12 DIAGNOSIS — C50919 Malignant neoplasm of unspecified site of unspecified female breast: Secondary | ICD-10-CM

## 2012-01-12 DIAGNOSIS — Z853 Personal history of malignant neoplasm of breast: Secondary | ICD-10-CM

## 2012-01-12 DIAGNOSIS — E559 Vitamin D deficiency, unspecified: Secondary | ICD-10-CM

## 2012-01-12 DIAGNOSIS — E119 Type 2 diabetes mellitus without complications: Secondary | ICD-10-CM

## 2012-01-12 MED ORDER — VENLAFAXINE HCL ER 37.5 MG PO CP24: 37.5000 mg | ORAL_CAPSULE | ORAL | Status: DC

## 2012-01-12 NOTE — Progress Notes (Signed)
Hematology and Oncology Follow Up Visit  Jill Richardson [id number] 04-Jan-1956 56 y.o. 01/12/2012 4:40 PM PCP [name]  Principle Diagnosis: :  T1c N0, ER PR positive breast cancer status post radiation therapy and lumpectomy, radiation therapy completed 10/19/2006.  Previously on tamoxifen, now on Femara and Effexor.  Interim History: Since being seen last she apparently developed type 2 diabetes. After feeling poorly with recurrent infections, having persistent thirst and fatigue she did a random blood sugar check was found to have a glucose of 560. A hemoglobin A1c at that time was over 14. She has since lost about 20 pounds. She has also initiated the insulin therapy when is now on June via alone. Her blood sugars are now within normal range and she feels tremendously better. She continues on the Femara, Effexor . If you weeks ago she saw her primary care doctor because she noted a mass in her right axilla. This actually shrunk spontaneously. She does continue to have hot flashes and despite weaning herself off Effexor she continues to require this to keep her hot flashes at bay.. She has not had imaging of the axilla itself. 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  Medications: I have reviewed the patient's current medications.  Allergies:  Allergies  Allergen Reactions  . Lipitor (Atorvastatin Calcium)   . Lodine (Etodolac)     Past Medical History, Surgical history, Social history, and Family History were reviewed and updated.  Review of Systems: Constitutional:  Negative for fever, chills, night sweats, anorexia, weight loss, pain. Cardiovascular: no chest pain or dyspnea on exertion Respiratory: no cough, shortness of breath, or wheezing Neurological: negative Dermatological: negative ENT: negative Skin Gastrointestinal: no abdominal pain, change in bowel habits, or black or bloody stools Genito-Urinary: no dysuria, trouble voiding, or  hematuria Hematological and Lymphatic: negative Breast: negative for breast lumps Musculoskeletal: negative Remaining ROS negative, tolerating AI, w/out significant HF or pain.  Physical Exam: Blood pressure 127/74, pulse 79, temperature 98.3 F (36.8 C), height 5' 1.25" (1.556 m), weight 156 lb 4.8 oz (70.897 kg). ECOG: 0 General appearance: alert, cooperative and appears stated age Head: Normocephalic, without obvious abnormality, atraumatic Neck: no adenopathy, no carotid bruit, no JVD, supple, symmetrical, trachea midline and thyroid not enlarged, symmetric, no tenderness/mass/nodules Lymph nodes: Cervical, supraclavicular, and axillary nodes normal. Specifically the right axilla is negative I cannot appreciate any obvious adenopathy or mass.,  Cardiac : regular rate and rhythm, no murmurs or gallops Pulmonary:clear to auscultation bilaterally and normal percussion bilaterally Breasts: inspection negative, no nipple discharge or bleeding, no masses or nodularity palpable Abdomen:soft, non-tender; bowel sounds normal; no masses,  no organomegaly Extremities negative Neuro: alert, oriented, normal speech, no focal findings or movement disorder noted  Lab Results: Lab Results  Component Value Date   WBC 5.7 01/07/2012   HGB 12.4 01/07/2012   HCT 38.6 01/07/2012   MCV 86.6 01/07/2012   PLT 327 01/07/2012     Chemistry      Component Value Date/Time   NA 140 01/07/2012 1200   K 4.5 01/07/2012 1200   CL 106 01/07/2012 1200   CO2 25 01/07/2012 1200   BUN 21 01/07/2012 1200   CREATININE 0.90 01/07/2012 1200   CREATININE 0.85 11/09/2011 1656      Component Value Date/Time   CALCIUM 9.4 01/07/2012 1200   ALKPHOS 32* 01/07/2012 1200   AST 33 01/07/2012 1200   ALT 30 01/07/2012 1200   BILITOT 0.4 01/07/2012  1200      .pathology. Radiological Studies: chest X-ray n/a Mammogram In 2013 Bone density Due in 2013  Impression and Plan: 55 year old woman with history of node-negative  ER/PR positive breast cancer status post lumpectomy radiation on prolonged hormonal therapy. A diagnosis of type 2 diabetes on active therapy She doing well if no evidence of ongoing or active disease. I will see her in a years time with appropriate imaging studies.  More than 50% of the visit was spent in patient-related counselling   Pierce Crane, MD 7/17/20134:40 PM

## 2012-01-12 NOTE — Telephone Encounter (Signed)
Gave patient appointment for 12-2012 printed out calendar and gave to the patient 

## 2012-01-14 ENCOUNTER — Other Ambulatory Visit (HOSPITAL_COMMUNITY): Payer: Self-pay | Admitting: Specialist

## 2012-01-14 DIAGNOSIS — M25561 Pain in right knee: Secondary | ICD-10-CM

## 2012-01-18 ENCOUNTER — Ambulatory Visit (HOSPITAL_COMMUNITY)
Admission: RE | Admit: 2012-01-18 | Discharge: 2012-01-18 | Disposition: A | Discharge status: Home / Self Care | Payer: 59 | Source: Ambulatory Visit | Attending: Specialist | Admitting: Specialist

## 2012-01-18 DIAGNOSIS — M1710 Unilateral primary osteoarthritis, unspecified knee: Secondary | ICD-10-CM | POA: Insufficient documentation

## 2012-01-18 DIAGNOSIS — M25569 Pain in unspecified knee: Secondary | ICD-10-CM | POA: Insufficient documentation

## 2012-01-18 DIAGNOSIS — IMO0002 Reserved for concepts with insufficient information to code with codable children: Secondary | ICD-10-CM | POA: Insufficient documentation

## 2012-01-18 DIAGNOSIS — M25561 Pain in right knee: Secondary | ICD-10-CM

## 2012-01-20 ENCOUNTER — Encounter: Payer: 59 | Attending: Internal Medicine | Admitting: *Deleted

## 2012-01-20 DIAGNOSIS — Z713 Dietary counseling and surveillance: Secondary | ICD-10-CM | POA: Insufficient documentation

## 2012-01-20 DIAGNOSIS — E119 Type 2 diabetes mellitus without complications: Secondary | ICD-10-CM | POA: Insufficient documentation

## 2012-01-21 ENCOUNTER — Encounter: Payer: Self-pay | Admitting: *Deleted

## 2012-01-21 NOTE — Progress Notes (Signed)
  Patient was seen on 01/20/2012 for the second of a series of three diabetes self-management courses at the [facility]. The following learning objectives were met by the patient during this course:   Explain basic nutrition maintenance and quality assurance  Describe causes, symptoms and treatment of hypoglycemia and hyperglycemia  Explain how to manage diabetes during illness  Describe the importance of good nutrition for health and healthy eating strategies  List strategies to follow meal plan when dining out  Describe the effects of alcohol on glucose and how to use it safely  Describe problem solving skills for day-to-day glucose challenges  Describe strategies to use when treatment plan needs to change  Identify important factors involved in successful weight loss  Describe ways to remain physically active  Describe the impact of regular activity on insulin resistance  Handouts given in class:  Refrigerator magnet for Sick Day Guidelines  [facility] Oral medication/insulin handout  Follow-Up Plan: Patient will attend the final class of the ADA Diabetes Self-Care Education.    

## 2012-02-15 ENCOUNTER — Encounter: Payer: 59 | Attending: Internal Medicine | Admitting: Dietician

## 2012-02-15 ENCOUNTER — Ambulatory Visit: Payer: Commercial Managed Care - PPO | Attending: Specialist | Admitting: Physical Therapy

## 2012-02-15 DIAGNOSIS — Z713 Dietary counseling and surveillance: Secondary | ICD-10-CM | POA: Insufficient documentation

## 2012-02-15 DIAGNOSIS — M25569 Pain in unspecified knee: Secondary | ICD-10-CM | POA: Insufficient documentation

## 2012-02-15 DIAGNOSIS — M25669 Stiffness of unspecified knee, not elsewhere classified: Secondary | ICD-10-CM | POA: Insufficient documentation

## 2012-02-15 DIAGNOSIS — R262 Difficulty in walking, not elsewhere classified: Secondary | ICD-10-CM | POA: Insufficient documentation

## 2012-02-15 DIAGNOSIS — E119 Type 2 diabetes mellitus without complications: Secondary | ICD-10-CM | POA: Insufficient documentation

## 2012-02-15 DIAGNOSIS — IMO0001 Reserved for inherently not codable concepts without codable children: Secondary | ICD-10-CM | POA: Insufficient documentation

## 2012-02-16 NOTE — Progress Notes (Signed)
  Patient was seen on 02/15/2012 for the third of a series of three diabetes self-management courses at the [facility]. The following learning objectives were met by the patient during this course:    Describe how diabetes changes over time   Identify diabetes complications and ways to prevent them   Describe strategies that can promote heart health including lowering blood pressure and cholesterol   Describe strategies to lower dietary fat and sodium in the diet   Identify physical activities that benefit cardiovascular health   Evaluate success in meeting personal goal   Describe the belief that they can live successfully with diabetes day to day   Establish 2-3 goals that they will plan to diligently work on until they return for the free 62-month follow-up visit  The following handouts were given in class:  3 Month Follow Up Visit handout  Goal setting handout  Class evaluation form  Your patient has established the following 3 month goals for diabetes self-care:  Count carbohydrates at most of my meals and snacks.  Increase my activity (esample take the stairs) at least 5 days a week.  Take my diabetes medications as scheduled.  Check cholesterol levels, lose weight.  To help manage my stress, I will do pleasure read at least 3 times a week.  Test my glucose at least 1 times a day, 7 days a week.  Follow-Up Plan: Patient will attend a 3 month follow-up visit for diabetes self-management education.

## 2012-02-17 ENCOUNTER — Ambulatory Visit: Payer: Commercial Managed Care - PPO | Admitting: Rehabilitation

## 2012-02-21 ENCOUNTER — Encounter: Payer: Commercial Managed Care - PPO | Admitting: Rehabilitation

## 2012-02-23 ENCOUNTER — Ambulatory Visit: Payer: Commercial Managed Care - PPO | Admitting: Rehabilitation

## 2012-03-01 ENCOUNTER — Encounter: Payer: Commercial Managed Care - PPO | Admitting: Rehabilitation

## 2012-03-02 ENCOUNTER — Encounter: Payer: Commercial Managed Care - PPO | Admitting: Rehabilitation

## 2012-03-06 ENCOUNTER — Encounter: Payer: Commercial Managed Care - PPO | Admitting: Physical Therapy

## 2012-03-09 ENCOUNTER — Encounter: Payer: Commercial Managed Care - PPO | Admitting: Physical Therapy

## 2012-04-25 ENCOUNTER — Ambulatory Visit (INDEPENDENT_AMBULATORY_CARE_PROVIDER_SITE_OTHER): Payer: 59 | Admitting: Internal Medicine

## 2012-04-25 ENCOUNTER — Encounter: Payer: Self-pay | Admitting: Internal Medicine

## 2012-04-25 VITALS — BP 136/90 | HR 76 | Temp 97.4°F | Wt 153.0 lb

## 2012-04-25 DIAGNOSIS — E8881 Metabolic syndrome: Secondary | ICD-10-CM | POA: Insufficient documentation

## 2012-04-25 DIAGNOSIS — E785 Hyperlipidemia, unspecified: Secondary | ICD-10-CM

## 2012-04-25 DIAGNOSIS — E119 Type 2 diabetes mellitus without complications: Secondary | ICD-10-CM

## 2012-04-25 DIAGNOSIS — I10 Essential (primary) hypertension: Secondary | ICD-10-CM

## 2012-04-25 NOTE — Patient Instructions (Addendum)
Add metformin 500 mg daily to diabetic regimen of Januvia 100 mg daily. Add Ramapril 2.5 mg daily. Return in 4 months for office visit and hemoglobin A 1C.

## 2012-04-25 NOTE — Progress Notes (Signed)
  Subjective:    Patient ID: Jill Richardson, female    DOB: 04-Jun-1956, 56 y.o.   MRN: [id number]  HPI in today for 4 month followup of diabetes mellitus. Hemoglobin A1c drawn and is pending. 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 Brings in multiple Accu-Chek readings which are very acceptable. She's lost some weight and is following a diabetic diet. She is on Januvia 100 mg daily. Pharmacist who suggested she be placed on metformin and an ACE inhibitor. Going to start metformin 500 mg daily and reevaluate in 4 months. She'll start Ramipril 2.5 mg daily.    Review of Systems     Objective:   Physical Exam chest clear to auscultation; cardiac exam regular rate and rhythm; extremities without edema, diabetic foot exam negative        Assessment & Plan:  Type 2 diabetes mellitus  Hypertension  Hyperlipidemia  Fibromyalgia syndrome  GE reflux  History of breast cancer  Metabolic syndrome  Plan: Add metformin 500 mg daily, and ACE inhibitor Ramapril 2.5 mg daily. Reevaluate in 4 months.

## 2012-05-03 ENCOUNTER — Encounter: Payer: 59 | Attending: Internal Medicine | Admitting: Dietician

## 2012-05-03 ENCOUNTER — Encounter: Payer: Self-pay | Admitting: Dietician

## 2012-05-03 VITALS — Ht 62.0 in | Wt 152.8 lb

## 2012-05-03 DIAGNOSIS — Z713 Dietary counseling and surveillance: Secondary | ICD-10-CM | POA: Insufficient documentation

## 2012-05-03 DIAGNOSIS — E119 Type 2 diabetes mellitus without complications: Secondary | ICD-10-CM | POA: Insufficient documentation

## 2012-05-03 NOTE — Progress Notes (Signed)
  Patient was seen on 05/03/2012 for their 3 month follow-up as a part of the diabetes self-management courses at the [facility]. The following learning objectives were met by your patient during this course:  Patient self reports the following:  Diabetes control has improved since diabetes self-management training: Yes, has lost weight and A1C was 10.5% and is now 6.2%. Number of days blood glucose is >200: none Last MD appointment for diabetes: October 2013 Changes in treatment plan: Started Metformin 500 mg Am and PM and Vasotec 2.5 mg daily.  Continues on Januvia Confidence with ability to manage diabetes: Feels more confident Areas for improvement with diabetes self-care: Carb counting and lose some more weith Willingness to participate in diabetes support group: Not sure.  Please see Diabetes Flow sheet for findings related to patient's self-care.  Follow-Up Plan: Patient is eligible for a "free" 30 minute diabetes self-care appointment in the next year. Patient to call and schedule as needed.

## 2012-05-11 ENCOUNTER — Other Ambulatory Visit: Payer: Self-pay | Admitting: Internal Medicine

## 2012-05-22 ENCOUNTER — Ambulatory Visit
Admission: RE | Admit: 2012-05-22 | Discharge: 2012-05-22 | Disposition: A | Discharge status: Home / Self Care | Payer: 59 | Source: Ambulatory Visit | Attending: Oncology | Admitting: Oncology

## 2012-05-22 DIAGNOSIS — E559 Vitamin D deficiency, unspecified: Secondary | ICD-10-CM

## 2012-05-22 DIAGNOSIS — C50919 Malignant neoplasm of unspecified site of unspecified female breast: Secondary | ICD-10-CM

## 2012-05-31 ENCOUNTER — Other Ambulatory Visit: Payer: Self-pay | Admitting: Obstetrics and Gynecology

## 2012-07-17 ENCOUNTER — Other Ambulatory Visit: Payer: Self-pay | Admitting: Internal Medicine

## 2012-08-01 ENCOUNTER — Other Ambulatory Visit: Payer: Self-pay | Admitting: Internal Medicine

## 2012-08-11 ENCOUNTER — Ambulatory Visit (INDEPENDENT_AMBULATORY_CARE_PROVIDER_SITE_OTHER): Payer: 59 | Admitting: Internal Medicine

## 2012-08-11 VITALS — BP 140/90 | Temp 97.0°F

## 2012-08-11 DIAGNOSIS — T63331A Toxic effect of venom of brown recluse spider, accidental (unintentional), initial encounter: Secondary | ICD-10-CM

## 2012-08-11 DIAGNOSIS — T6391XA Toxic effect of contact with unspecified venomous animal, accidental (unintentional), initial encounter: Secondary | ICD-10-CM

## 2012-08-14 ENCOUNTER — Encounter: Payer: Self-pay | Admitting: Internal Medicine

## 2012-08-14 NOTE — Patient Instructions (Addendum)
Take doxycycline 100 mg twice daily for 10 days. Call if symptoms worsen.

## 2012-08-14 NOTE — Progress Notes (Signed)
  Subjective:    Patient ID: Jill Richardson, female    DOB: Feb 28, 1956, 57 y.o.   MRN: [id number]  HPI 57 year old white female called today complaining of lesion on dorsum of left and that she thinks may be an insect bite. Says she went last evening to a dark shed to feed her animals. When she awakened this morning there was a discolored area dorsum of left hand with a central punctum. No fever or shaking chills. No systemic symptoms such as nausea and vomiting. She says her tetanus immunization is up to date through employee health at [facility].    Review of Systems     Objective:   Physical Exam 3 cm x 4 cm discolored macular area central aspect dorsum of left hand with central punctum. No pus expressed. No increased warmth.       Assessment & Plan:  Insect bite left hand-this was likely a brown recluse spider bite  Plan: Doxycycline 100 mg by mouth twice a day for 10 days. As stated above tetanus immunization is up-to-date. Call if symptoms worsen. Patient having some issues with her mother who has dementia, her sister who has not been easy to deal with and bronchitis in one of her sons.

## 2012-08-18 ENCOUNTER — Ambulatory Visit (INDEPENDENT_AMBULATORY_CARE_PROVIDER_SITE_OTHER): Payer: Self-pay | Admitting: Family Medicine

## 2012-08-18 DIAGNOSIS — E1310 Other specified diabetes mellitus with ketoacidosis without coma: Secondary | ICD-10-CM

## 2012-08-18 DIAGNOSIS — E1110 Type 2 diabetes mellitus with ketoacidosis without coma: Secondary | ICD-10-CM

## 2012-08-22 ENCOUNTER — Encounter: Payer: Self-pay | Admitting: Internal Medicine

## 2012-08-22 ENCOUNTER — Ambulatory Visit (INDEPENDENT_AMBULATORY_CARE_PROVIDER_SITE_OTHER): Payer: 59 | Admitting: Internal Medicine

## 2012-08-22 VITALS — BP 134/88 | HR 68 | Ht 62.0 in | Wt 152.0 lb

## 2012-08-22 DIAGNOSIS — Z9189 Other specified personal risk factors, not elsewhere classified: Secondary | ICD-10-CM

## 2012-08-22 DIAGNOSIS — M797 Fibromyalgia: Secondary | ICD-10-CM

## 2012-08-22 DIAGNOSIS — E1159 Type 2 diabetes mellitus with other circulatory complications: Secondary | ICD-10-CM

## 2012-08-22 DIAGNOSIS — F329 Major depressive disorder, single episode, unspecified: Secondary | ICD-10-CM

## 2012-08-22 DIAGNOSIS — Z853 Personal history of malignant neoplasm of breast: Secondary | ICD-10-CM

## 2012-08-22 DIAGNOSIS — G4700 Insomnia, unspecified: Secondary | ICD-10-CM | POA: Insufficient documentation

## 2012-08-22 DIAGNOSIS — IMO0001 Reserved for inherently not codable concepts without codable children: Secondary | ICD-10-CM

## 2012-08-22 DIAGNOSIS — E785 Hyperlipidemia, unspecified: Secondary | ICD-10-CM

## 2012-08-22 DIAGNOSIS — F32A Depression, unspecified: Secondary | ICD-10-CM

## 2012-08-22 LAB — HEMOGLOBIN A1C
Hgb A1c MFr Bld: 6.6 % — ABNORMAL HIGH (ref <5.7)
Mean Plasma Glucose: 143 mg/dL — ABNORMAL HIGH (ref <117)

## 2012-08-22 MED ORDER — LORAZEPAM 2 MG PO TABS: 2.0000 mg | ORAL_TABLET | Freq: Every evening | ORAL | Status: DC | PRN

## 2012-08-22 MED ORDER — PANTOPRAZOLE SODIUM 40 MG PO TBEC: 40.0000 mg | DELAYED_RELEASE_TABLET | Freq: Every day | ORAL | Status: DC

## 2012-08-22 MED ORDER — TRAMADOL HCL 50 MG PO TABS: 50.0000 mg | ORAL_TABLET | Freq: Three times a day (TID) | ORAL | Status: DC | PRN

## 2012-08-22 NOTE — Patient Instructions (Addendum)
Fasting labs drawn today and are pending. Continue same medications and return in 6 months for physical exam. We are changing Nexium to Protonix, have refilled Ativan and tramadol today for 6 months. Samples of Crestor 20 mg given to patient today since [facility] is now using generic statin medication. Patient has history of fibromyalgia syndrome and I do not want to change from Crestor. Crestor savings card given.

## 2012-08-22 NOTE — Progress Notes (Signed)
  Subjective:    Patient ID: Jill Richardson, female    DOB: February 03, 1956, 57 y.o.   MRN: [id number]  HPI 57 year old white female with history of fibromyalgia, history of breast cancer, depression, hyperlipidemia, GE reflux, vitamin D deficiency, type 2 diabetes mellitus. Patient says Accu-Cheks are running around 80. Sometimes she doesn't eat breakfast and blood sugar gets low around lunchtime. Takes Januvia in the mornings. Patient should try to eat regularly particularly breakfast. At suggestion of pharmacy, have started metformin and Ramipril 2.5 mg daily as of October 2013. Pharmacy at [facility] has discontinued Nexium. Will need to change to Protonix generic. Also [facility] wants to use generic statin but because of fibromyalgia history and potential for myalgias on  statins plan to keep her on Crestor. Given samples of Crestor today plus a Crestor savings card. History of SVT treated with digoxin.  She was seen here February 14 with insect bite left dorsal hand. It has improved considerably with Levaquin. However still feels numb around the bite area. She has no weakness in the left hand. Suspect this was a brown recluse spider bite.  Reminded about diabetic eye exam.    Review of Systems     Objective:   Physical Exam dorsum of left hand-decreased erythema and swelling. Muscle strength normal in left hand. Chest clear to auscultation. Cardiac exam regular rate and rhythm. Extremities without edema. Diabetic foot exam no ulcers or calluses        Assessment & Plan:  Type 2 diabetes mellitus-reminded about annual eye exam. Is on Ramipril 2.5 mg daily for renal protection  Hyperlipidemia-fasting lipid panel liver functions drawn today on Crestor  Recent spider bite left hand-improving still has residual numbness in bite area  History of breast cancer  History of depression  Insomnia-refill Ativan  Metabolic syndrome  History of SVT-stable with digoxin  Fibromyalgia  Plan:  Return in 6 months for physical examination and fasting labs.

## 2012-08-24 NOTE — Progress Notes (Signed)
Subjective: Type 2 DM patient seen at outpatient pharmacy for 3 month follow-up.  Objective: A1c 6.6% (2/25), increased from 6.2% on 10/29. Wt 150 lbs. Ht 5'2". BP 153/86. HR 75.   Assessment: Type 2 DM A1c has increased slightly since last visit. Patient is not currently exercising due to time constraints (school, full-time job, taking care of her mother and family). The only exercise she currently gets it at work by walking the floors. She does take the steps instead of the elevator.  Patient is doing much better with diet. Admits that she still struggles with limiting the amount of carbs at times, but she has come a long way. She is able to resist sweets around the office and is trying new recipes using Splenda to help satisfy her sweet tooth. Patient reports having some hypoglycemic episodes in the afternoon if she doesn't have a snack, so patient will now eat a small, healthy snack. Patient reports no hypoglycemia in the mornings. Patient is checking blood glucose once daily. Reports fasting BG 70-90. She checks post-prandial about 2-3 times/week and reports 115-150. Since last visit, Dr. Lenord Fellers has increased patient's dose of metformin to bid. Patient reports that gastrointestinal side effects with the metformin increase have now subsided. Patient will see Dr. Lenord Fellers on 2/25 for a follow-up and complete labs. Patient is now taking ramipril since my suggestion from last visit. All other medications remain the same. Patient reports no problems and/or side effects at this time. Of note, patient's blood pressure was elevated today at 153/86. Patient thinks it is from stress and lack of sleep. She checks her BP at home daily and reports 120/70-80s. Will continue to monitor BP.   Plan:  I will follow up with Dr. Lenord Fellers within the next few weeks to get labwork. Patient seems controlled for now. Will follow up with patient on Thursday, June 5th at 10:00am.  Follow up visit 3 months

## 2012-09-05 NOTE — Progress Notes (Signed)
ATTENDING PHYSICIAN NOTE: I have reviewed the chart and agree with the plan as detailed above. Sara Neal MD Pager [phone]  

## 2012-09-16 ENCOUNTER — Telehealth: Payer: Self-pay | Admitting: Oncology

## 2012-09-16 NOTE — Telephone Encounter (Signed)
LVOM FOR PT IN RE TO PROVIDER TO DR. [facility] AND APPT 07/17 @ 3:30.  LETTER/CALENDAR

## 2012-09-18 ENCOUNTER — Encounter: Payer: Self-pay | Admitting: Oncology

## 2012-10-09 ENCOUNTER — Other Ambulatory Visit: Payer: Self-pay | Admitting: Oncology

## 2012-10-23 ENCOUNTER — Other Ambulatory Visit: Payer: Self-pay | Admitting: Oncology

## 2012-10-23 DIAGNOSIS — C50919 Malignant neoplasm of unspecified site of unspecified female breast: Secondary | ICD-10-CM

## 2012-10-23 DIAGNOSIS — R232 Flushing: Secondary | ICD-10-CM

## 2012-10-23 MED ORDER — VENLAFAXINE HCL ER 37.5 MG PO CP24: 37.5000 mg | ORAL_CAPSULE | Freq: Every day | ORAL | Status: DC

## 2012-10-23 NOTE — Telephone Encounter (Signed)
Pharmacy sent refill request via surescript with sig to take before procedure.  Pharmacy faxed request for clarification.  Sent a second refill in with sig to take one 37.5 mg tablet daily.

## 2012-10-23 NOTE — Addendum Note (Signed)
Addended by: Augusto Garbe on: 10/23/2012 02:25 PM   Modules accepted: Orders

## 2012-11-13 ENCOUNTER — Other Ambulatory Visit: Payer: Self-pay | Admitting: Internal Medicine

## 2012-11-27 ENCOUNTER — Other Ambulatory Visit: Payer: Self-pay | Admitting: Internal Medicine

## 2013-01-04 ENCOUNTER — Other Ambulatory Visit (HOSPITAL_BASED_OUTPATIENT_CLINIC_OR_DEPARTMENT_OTHER): Payer: 59 | Admitting: Lab

## 2013-01-04 ENCOUNTER — Other Ambulatory Visit: Payer: Self-pay | Admitting: Adult Health

## 2013-01-04 DIAGNOSIS — Z8639 Personal history of other endocrine, nutritional and metabolic disease: Secondary | ICD-10-CM

## 2013-01-04 DIAGNOSIS — E559 Vitamin D deficiency, unspecified: Secondary | ICD-10-CM

## 2013-01-04 DIAGNOSIS — Z853 Personal history of malignant neoplasm of breast: Secondary | ICD-10-CM

## 2013-01-04 DIAGNOSIS — C50419 Malignant neoplasm of upper-outer quadrant of unspecified female breast: Secondary | ICD-10-CM

## 2013-01-04 LAB — CBC WITH DIFFERENTIAL/PLATELET
Basophils Absolute: 0.1 10*3/uL (ref 0.0–0.1)
EOS%: 2.5 % (ref 0.0–7.0)
Eosinophils Absolute: 0.1 10*3/uL (ref 0.0–0.5)
HGB: 11.7 g/dL (ref 11.6–15.9)
NEUT#: 3.3 10*3/uL (ref 1.5–6.5)
RBC: 4.07 10*6/uL (ref 3.70–5.45)
RDW: 15.2 % — ABNORMAL HIGH (ref 11.2–14.5)
lymph#: 1.7 10*3/uL (ref 0.9–3.3)

## 2013-01-04 LAB — COMPREHENSIVE METABOLIC PANEL (CC13)
AST: 24 U/L (ref 5–34)
Albumin: 3.4 g/dL — ABNORMAL LOW (ref 3.5–5.0)
BUN: 22.9 mg/dL (ref 7.0–26.0)
Calcium: 10.3 mg/dL (ref 8.4–10.4)
Chloride: 108 mEq/L (ref 98–109)
Glucose: 94 mg/dl (ref 70–140)
Potassium: 4 mEq/L (ref 3.5–5.1)
Sodium: 144 mEq/L (ref 136–145)
Total Protein: 6.9 g/dL (ref 6.4–8.3)

## 2013-01-11 ENCOUNTER — Encounter: Payer: Self-pay | Admitting: Adult Health

## 2013-01-11 ENCOUNTER — Ambulatory Visit: Payer: Commercial Managed Care - PPO | Admitting: Oncology

## 2013-01-11 ENCOUNTER — Telehealth: Payer: Self-pay | Admitting: *Deleted

## 2013-01-11 ENCOUNTER — Ambulatory Visit (HOSPITAL_BASED_OUTPATIENT_CLINIC_OR_DEPARTMENT_OTHER): Payer: 59 | Admitting: Adult Health

## 2013-01-11 VITALS — BP 136/84 | HR 72 | Temp 98.4°F | Resp 20 | Ht 62.0 in | Wt 149.8 lb

## 2013-01-11 DIAGNOSIS — Z853 Personal history of malignant neoplasm of breast: Secondary | ICD-10-CM

## 2013-01-11 DIAGNOSIS — C50519 Malignant neoplasm of lower-outer quadrant of unspecified female breast: Secondary | ICD-10-CM

## 2013-01-11 DIAGNOSIS — Z170 Estrogen receptor positive status [ER+]: Secondary | ICD-10-CM

## 2013-01-11 MED ORDER — LETROZOLE 2.5 MG PO TABS: 2.5000 mg | ORAL_TABLET | Freq: Every day | ORAL | Status: DC

## 2013-01-11 NOTE — Patient Instructions (Signed)
Doing well.  No sign of recurrence. Continue Vitamin D and Calcium with weight bearing exercises.  Continue Letrozole. Please call us if you have any questions or concerns.

## 2013-01-11 NOTE — Progress Notes (Addendum)
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    PRIOR THERAPY:  1. Patient underwent screening mammogram on 12/17/2--7 that detected a mass in the left breast.  A diagnostic mammogram on 06/24/2006 showed a 1.2 cm mass in the left breast at 5 o'clock and a biopsy showed invasive mammary carcinoma.  MRI on 07/03/2006 a 1.6 cm lesion in the left breast.    2.  Patient underwent lumpectomy on 07/15/2006 by Dr. Jamey Ripa.  He removed a 1.4cm grade 2 lesion of invasive ductal carcinoma.  ER 100%, PR 100%, HER-2/neu negative with a Ki-67 of 25%.  2 sentinel nodes were biopsied and negative.  A bone scan was done in February, 2008 and negative.    3. Radiation therapy from August 30, 2006 through Oct 27, 2006.    4. Patient was placed on Tamoxifen x 2 years in May 2008.  She then was changed to Femara.   CURRENT THERAPY: Femara  INTERVAL HISTORY: Jill Richardson 57 y.o. female returns for follow up and evaluation of her h/o invasive ductal carcinoma of the left breast. She is doing well.  She does endorse hot flashes thaqt she takes Effexor for.  Otherwise, she denies fevers, chills, night sweats, weight loss, pain, or any further concerns.  Otherwise, a 10 point ROS is negative.    MEDICAL HISTORY: Past Medical History  Diagnosis Date  . Hyperlipidemia   . SVT (supraventricular tachycardia)   . Fibromyalgia   . Breast cancer 2008  . S/P lumbar fusion 2005    L5-S1  . GERD (gastroesophageal reflux disease)     ALLERGIES:  is allergic to lipitor and lodine.  MEDICATIONS:  Current Outpatient Prescriptions  Medication Sig Dispense Refill  . CALCIUM PO Take by mouth.        . Cholecalciferol (D3 ADULT PO) Take 6,200 Int'l Units by mouth daily.        Marland Kitchen DIGOX 0.25 MG tablet TAKE 1 TABLET BY MOUTH  DAILY  90 tablet  PRN  . ergocalciferol (VITAMIN D2) 50000 UNITS capsule Take 5,000 Units by mouth daily.       . fenofibrate (TRICOR) 145 MG tablet Take 145 mg by mouth daily.        Marland Kitchen glucosamine-chondroitin 500-400 MG tablet Take 1 tablet by mouth daily.      Marland Kitchen JANUVIA 100 MG tablet TAKE 1 TABLET BY MOUTH DAILY  90 tablet  3  . letrozole (FEMARA) 2.5 MG tablet Take 1 tablet (2.5 mg total) by mouth daily.  90 tablet  12  . LORazepam (ATIVAN) 2 MG tablet Take 1 tablet (2 mg total) by mouth at bedtime as needed for anxiety.  90 tablet  1  . metFORMIN (GLUCOPHAGE) 500 MG tablet TAKE 1 TABLE TBY MOUTH TWICE DAILY  180 tablet  1  . Multiple Vitamin (MULTIVITAMIN) capsule Take 1 capsule by mouth daily.        Marland Kitchen oxaprozin (DAYPRO) 600 MG tablet TAKE 2 TABLETS BY MOUTH DAILY  180 tablet  PRN  . pantoprazole (PROTONIX) 40 MG tablet Take 1 tablet (40 mg total) by mouth daily.  30 tablet  3  . ramipril (ALTACE) 2.5 MG capsule TAKE 1 CAPSULE BY MOUTH DAILY  90 capsule  3  . rosuvastatin (CRESTOR) 10 MG tablet Take 10 mg by  mouth daily.        . traMADol (ULTRAM) 50 MG tablet Take 1 tablet (50 mg total) by mouth every 8 (eight) hours as needed for pain.  270 tablet  1  . TRUEPLUS LANCETS 30G MISC TEST BLOOD SUGAR 4 TIMES A DAY  100 each  0  . TRUETEST TEST test strip TEST BLOOD SUGAR 4 TIMES A DAY  100 each  0  . venlafaxine XR (EFFEXOR-XR) 37.5 MG 24 hr capsule Take 1 capsule (37.5 mg total) by mouth daily.  90 capsule  PRN   No current facility-administered medications for this visit.    SURGICAL HISTORY:  Past Surgical History  Procedure Laterality Date  . Lumbar fusion  2005  . Ovary removed  1997  . Breast lumpectomy  2008    LEFT    REVIEW OF SYSTEMS:  General: fatigue (-), night sweats (-), fever (-), pain (-) Lymph: palpable nodes (-) HEENT: vision changes (-), mucositis (-), gum bleeding (-), epistaxis (-) Cardiovascular: chest pain (-), palpitations (-) Pulmonary: shortness of breath  (-), dyspnea on exertion (-), cough (-), hemoptysis (-) GI:  Early satiety (-), melena (-), dysphagia (-), nausea/vomiting (-), diarrhea (-) GU: dysuria (-), hematuria (-), incontinence (-) Musculoskeletal: joint swelling (-), joint pain (-), back pain (-) Neuro: weakness (-), numbness (-), headache (-), confusion (-) Skin: Rash (-), lesions (-), dryness (-) Psych: depression (-), suicidal/homicidal ideation (-), feeling of hopelessness (-)   HEALTH MAINTENANCE:  Mammogram 05/22/2012 Colonoscopy 9 years ago Bone Density 05/22/2012 Pap Smear 05/31/2012 Eye Exam 12/2012 Vitamin D 01/04/13 82 Lipid Panel 12/24/2011  PHYSICAL EXAMINATION: Blood pressure 136/84, pulse 72, temperature 98.4 F (36.9 C), temperature source Oral, resp. rate 20, height 5\' 2"  (1.575 m), weight 149 lb 12.8 oz (67.949 kg). Body mass index is 27.39 kg/(m^2). General: Patient is a well appearing female in no acute distress HEENT: PERRLA, sclerae anicteric no conjunctival pallor, MMM Neck: supple, no palpable adenopathy Lungs: clear to auscultation bilaterally, no wheezes, rhonchi, or rales Cardiovascular: regular rate rhythm, S1, S2, no murmurs, rubs or gallops Abdomen: Soft, non-tender, non-distended, normoactive bowel sounds, no HSM Extremities: warm and well perfused, no clubbing, cyanosis, or edema Skin: No rashes or lesions Neuro: Non-focal Breasts: left lumpectomy no nodularity, left breast no masses, or skin change, right breast no masses nodules or skin changes.   ECOG PERFORMANCE STATUS: 1 - Symptomatic but completely ambulatory      LABORATORY DATA: Lab Results  Component Value Date   WBC 5.6 01/04/2013   HGB 11.7 01/04/2013   HCT 35.6 01/04/2013   MCV 87.7 01/04/2013   PLT 323 01/04/2013      Chemistry      Component Value Date/Time   NA 144 01/04/2013 1606   NA 140 01/07/2012 1200   K 4.0 01/04/2013 1606   K 4.5 01/07/2012 1200   CL 106 01/07/2012 1200   CO2 27 01/04/2013 1606   CO2 25 01/07/2012  1200   BUN 22.9 01/04/2013 1606   BUN 21 01/07/2012 1200   CREATININE 1.1 01/04/2013 1606   CREATININE 0.90 01/07/2012 1200   CREATININE 0.85 11/09/2011 1656      Component Value Date/Time   CALCIUM 10.3 01/04/2013 1606   CALCIUM 9.4 01/07/2012 1200   ALKPHOS 27* 01/04/2013 1606   ALKPHOS 32* 01/07/2012 1200   AST 24 01/04/2013 1606   AST 33 01/07/2012 1200   ALT 25 01/04/2013 1606   ALT 30 01/07/2012 1200   BILITOT 0.26 01/04/2013  1606   BILITOT 0.4 01/07/2012 1200       RADIOGRAPHIC STUDIES:  No results found.  ASSESSMENT:  Patient is a 57 year old female with h/o ER positive, PR positive, HER-2/neu negative invasive ductal carcinoma of the left breast.  She has underwent lumpectomy, radiation followed by Femara.  She is currently still taking Femara.    PLAN:  She is doing well.  She will continue Femara.  Patient will take Vitamin D and Calcium and continue weight bearing exercises.  We will see her back in 1 year.    All questions were answered. The patient knows to call the clinic with any problems, questions or concerns. We can certainly see the patient much sooner if necessary.  I spent 40 minutes counseling the patient face to face. The total time spent in the appointment was 60 minutes.  Cherie Ouch Lyn Hollingshead, NP Medical Oncology [facility] Phone: [phone] 01/12/2013, 6:36 PM       ATTENDING'S ATTESTATION:  I personally reviewed patient's chart, examined patient myself, formulated the treatment plan as followed.    Patient is now 57 years old she has a history of ER positive PR positive HER-2/neu negative invasive ductal carcinoma of the left breast she has had a lumpectomy followed by radiation and now she is on Femara. She is going to continue the Femara 2.5 mg daily. We encouraged her to take vitamin D and calcium as well as doing weightbearing exercises. At this time my recommendation is to see her once a year. However she knows to call us with any  problems questions or concerns  Drue Second, MD Medical/Oncology [facility] [phone] (beeper) [phone] (Office)

## 2013-01-11 NOTE — Telephone Encounter (Signed)
appts made and printed...td 

## 2013-02-16 ENCOUNTER — Other Ambulatory Visit: Payer: 59 | Admitting: Internal Medicine

## 2013-02-19 ENCOUNTER — Encounter: Payer: 59 | Admitting: Internal Medicine

## 2013-03-05 ENCOUNTER — Other Ambulatory Visit: Payer: Self-pay | Admitting: Oncology

## 2013-03-05 DIAGNOSIS — Z853 Personal history of malignant neoplasm of breast: Secondary | ICD-10-CM

## 2013-03-13 ENCOUNTER — Other Ambulatory Visit: Payer: Self-pay | Admitting: Internal Medicine

## 2013-03-13 MED ORDER — GLUCOSE BLOOD VI STRP: 100.0000 | ORAL_STRIP | Freq: Four times a day (QID) | Status: DC

## 2013-03-13 MED ORDER — TRUEPLUS LANCETS 30G MISC: 1.0000 | Freq: Four times a day (QID) | Status: DC

## 2013-03-19 ENCOUNTER — Other Ambulatory Visit: Payer: 59 | Admitting: Internal Medicine

## 2013-03-19 DIAGNOSIS — E119 Type 2 diabetes mellitus without complications: Secondary | ICD-10-CM

## 2013-03-19 DIAGNOSIS — Z1329 Encounter for screening for other suspected endocrine disorder: Secondary | ICD-10-CM

## 2013-03-19 DIAGNOSIS — Z130 Encounter for screening for diseases of the blood and blood-forming organs and certain disorders involving the immune mechanism: Secondary | ICD-10-CM

## 2013-03-19 DIAGNOSIS — E785 Hyperlipidemia, unspecified: Secondary | ICD-10-CM

## 2013-03-19 DIAGNOSIS — Z79899 Other long term (current) drug therapy: Secondary | ICD-10-CM

## 2013-03-19 LAB — CBC WITH DIFFERENTIAL/PLATELET
Basophils Absolute: 0 10*3/uL (ref 0.0–0.1)
Basophils Relative: 1 % (ref 0–1)
Eosinophils Absolute: 0.2 10*3/uL (ref 0.0–0.7)
Eosinophils Relative: 5 % (ref 0–5)
Lymphs Abs: 1.6 10*3/uL (ref 0.7–4.0)
MCH: 28.6 pg (ref 26.0–34.0)
MCHC: 33.2 g/dL (ref 30.0–36.0)
MCV: 86.1 fL (ref 78.0–100.0)
Neutrophils Relative %: 51 % (ref 43–77)
Platelets: 351 10*3/uL (ref 150–400)
RBC: 4.33 MIL/uL (ref 3.87–5.11)
RDW: 15.3 % (ref 11.5–15.5)

## 2013-03-19 LAB — LIPID PANEL
Cholesterol: 167 mg/dL (ref 0–200)
LDL Cholesterol: 94 mg/dL (ref 0–99)
Total CHOL/HDL Ratio: 3.2 Ratio
VLDL: 21 mg/dL (ref 0–40)

## 2013-03-19 LAB — HEMOGLOBIN A1C: Hgb A1c MFr Bld: 6 % — ABNORMAL HIGH (ref <5.7)

## 2013-03-19 LAB — COMPREHENSIVE METABOLIC PANEL
ALT: 28 U/L (ref 0–35)
AST: 26 U/L (ref 0–37)
Alkaline Phosphatase: 24 U/L — ABNORMAL LOW (ref 39–117)
Creat: 0.95 mg/dL (ref 0.50–1.10)
Sodium: 140 mEq/L (ref 135–145)
Total Bilirubin: 0.4 mg/dL (ref 0.3–1.2)
Total Protein: 6.8 g/dL (ref 6.0–8.3)

## 2013-03-20 ENCOUNTER — Encounter: Payer: Self-pay | Admitting: Internal Medicine

## 2013-03-20 LAB — VITAMIN D 25 HYDROXY (VIT D DEFICIENCY, FRACTURES): Vit D, 25-Hydroxy: 62 ng/mL (ref 30–89)

## 2013-04-12 ENCOUNTER — Ambulatory Visit (INDEPENDENT_AMBULATORY_CARE_PROVIDER_SITE_OTHER): Payer: Self-pay | Admitting: Family Medicine

## 2013-04-12 DIAGNOSIS — E119 Type 2 diabetes mellitus without complications: Secondary | ICD-10-CM

## 2013-04-12 NOTE — Progress Notes (Signed)
Subjective/Objective:  Patient presents today at the [facility] for her 3 month DM follow-up through the employer-sponsored Link to Wellness program. Patient is in a pleasant mood today and reports a decrease in her A1c to 6.0% from labs she had drawn in August. Patient is continuing to work on diet and exercise to decrease her A1c and keep better control of her blood glucose.   A1c 6.0% (03/19/13); BP 149/85; P 81; Ht 5'2"; Wt 150 lbs. Did not bring glucometer to appointment today for 7, 14, and 30 day recall.    Assessment:  Patient is currently taking metformin and Januvia for diabetes medication management. Patient's most recent A1c on 03/19/13 had decreased to 6.0% from 6.6% in February. She states that she is doing better with diet but admits that she still struggles with limiting the amount of carbs at times. She is able to resist sweets around the office. Patient reports having no hypoglycemic episodes since she now knows how to prevent them by snacking in between meals. Patient is checking blood glucose approximately every other day. Reports fasting BG 70-90. She checks post-prandial about 2-3 times/week and reports readings less than 120. Has had yearly eye exam, sees dentist every 6 months, and checks feet daily. Takes ACE-inhibitor. Patient had an appointment scheduled with Dr. Lenord Fellers in August but had to reschedule due to a conflict with the appointment time. She will see Dr. Lenord Fellers in November for her follow-up.   Plan:  Personal goals include keeping control of carbohydrate intake, especially around the holidays. Patient would also like to keep up exercise during the winter months in order to have better control over her diabetes. She will follow up with me at the Outpatient Pharmacy in 3 months on July 12, 2013 at 1:30pm.

## 2013-04-30 ENCOUNTER — Encounter: Payer: Self-pay | Admitting: Internal Medicine

## 2013-04-30 ENCOUNTER — Ambulatory Visit (INDEPENDENT_AMBULATORY_CARE_PROVIDER_SITE_OTHER): Payer: 59 | Admitting: Internal Medicine

## 2013-04-30 VITALS — BP 132/82 | HR 84 | Temp 97.1°F | Ht 62.0 in | Wt 151.0 lb

## 2013-04-30 DIAGNOSIS — M545 Low back pain, unspecified: Secondary | ICD-10-CM

## 2013-04-30 DIAGNOSIS — E785 Hyperlipidemia, unspecified: Secondary | ICD-10-CM

## 2013-04-30 DIAGNOSIS — IMO0001 Reserved for inherently not codable concepts without codable children: Secondary | ICD-10-CM

## 2013-04-30 DIAGNOSIS — E119 Type 2 diabetes mellitus without complications: Secondary | ICD-10-CM

## 2013-04-30 DIAGNOSIS — Z853 Personal history of malignant neoplasm of breast: Secondary | ICD-10-CM

## 2013-04-30 DIAGNOSIS — Z0000 Encounter for general adult medical examination without abnormal findings: Secondary | ICD-10-CM

## 2013-04-30 DIAGNOSIS — M797 Fibromyalgia: Secondary | ICD-10-CM

## 2013-04-30 DIAGNOSIS — K219 Gastro-esophageal reflux disease without esophagitis: Secondary | ICD-10-CM

## 2013-04-30 MED ORDER — METHYLPREDNISOLONE 4 MG PO TABS: ORAL_TABLET | ORAL | Status: DC

## 2013-04-30 MED ORDER — HYDROCODONE-ACETAMINOPHEN 10-325 MG PO TABS: 1.0000 | ORAL_TABLET | Freq: Three times a day (TID) | ORAL | Status: DC | PRN

## 2013-04-30 NOTE — Patient Instructions (Addendum)
Take Medrol dose pack as directed. Call if not better in 3 weeks. FMLA form completed.

## 2013-05-01 LAB — MICROALBUMIN / CREATININE URINE RATIO: Creatinine, Urine: 244.9 mg/dL

## 2013-05-06 ENCOUNTER — Encounter: Payer: Self-pay | Admitting: Internal Medicine

## 2013-05-06 NOTE — Progress Notes (Signed)
Subjective:    Patient ID: Jill Richardson, female    DOB: [birth year], 57 y.o.   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 Today he is having acute low back pain. Pain is radiating into right buttock and leg. She has had lumbar fusion L5-S1 in 2005 using a Gill procedure with cages and pedicle screws by Dr. Trey Sailors. In 2010 he saw her and she exhibited some degenerative change at L4-L5 with recurrent back pain. She continues to suffer from intermittent back pain. She has a history of breast cancer, control type 2 diabetes, hyperlipidemia, GE reflux, fibromyalgia and remote history of supraventricular tachycardia.  She is allergic to Lodine but able to tolerate Daypro. 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  She had left oophorectomy in March 1997 and  an exploratory laparotomy for small bowel obstruction one month later in 1997.  Had colonoscopy in 2008. History of dysplastic nevus of shoulder 2008   Long-standing history of fibromyalgia syndrome dating back to the late 1990s.   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 She has had 2 pregnancies, no miscarriages, menarche at age 57, parity age 57, menopausal around age 57. She took hormone replacement therapy until her cancer diagnosis. Tumor was T1 C. invasive ductal carcinoma which was ER/PR positive with evidence of lymphovascular invasion. Nodes were negative. She had radiation and lumpectomy. Radiation was completed for April 2008.  She was treated with tamoxifen and is now on Femara. She also takes Effexor for hot flashes.  Takes anti-inflammatory medication for fibromyalgia,  Crestor for hyperlipidemia, Flexeril sometimes for back pain,  Ativan for insomnia, occasional tramadol for back pain, occasional Norco  for back pain, Protonix for GE reflux, metformin and Januvia for diabetes  Social  history: She is married: Husband is disabled due to chronic brain injury after an accident involving a tree falling on him in the mid 1990s. 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 Does not smoke or consume alcohol. 2 adult sons. One granddaughter.  Family history: Father with history of diabetes and hypertension died from complications of heart failure. Brother with history of hypertension. One sister in good health. Mother in good health.   Review of Systems  Constitutional: Positive for fatigue.  Cardiovascular:       Remote history of SVT treated with Lanoxin  Gastrointestinal:       History of GE reflux  Endocrine:       History of control type 2 diabetes  Musculoskeletal:       History of fibromyalgia. History of L5-S1 fusion involving Gill procedure with cages and pedicle screws. Has recurrent low back pain from time to time  Allergic/Immunologic: Negative.   Neurological: Negative.   Hematological: Negative.   Psychiatric/Behavioral: Negative.        Objective:   Physical Exam  Vitals reviewed. Constitutional: She is oriented to person, place, and time. She appears well-developed and well-nourished. No distress.  HENT:  Head: Normocephalic and atraumatic.  Right Ear: External ear normal.  Left Ear: External ear normal.  Mouth/Throat: Oropharynx is clear and moist. No oropharyngeal exudate.  Eyes: Conjunctivae and EOM are normal. Pupils are equal, round, and reactive to light. Right eye exhibits no discharge. Left eye exhibits no discharge. No scleral icterus.  Neck: Neck supple. No JVD present. No thyromegaly present.  Cardiovascular: Normal  rate, regular rhythm, normal heart sounds and intact distal pulses.   No murmur heard. Pulmonary/Chest: Effort normal and breath sounds normal. No respiratory distress. She has no wheezes. She has no rales. She exhibits no tenderness.  Breasts normal female status post history of breast cancer 2008   Abdominal: Soft. Bowel sounds are normal. She exhibits no distension and no mass. There is no tenderness. There is no rebound and no guarding.  Genitourinary:  Deferred to GYN Dr. Tenny Craw. Pap done 05/31/2012 by Dr. Tenny Craw  Musculoskeletal: She exhibits tenderness. She exhibits no edema.  Tenderness in lower back with decreased range of motion. Straight leg raising positive on the right and negative on the left at 90  Lymphadenopathy:    She has no cervical adenopathy.  Neurological: She is alert and oriented to person, place, and time. She has normal reflexes. She displays normal reflexes. No cranial nerve deficit. Coordination normal.  Skin: Skin is warm and dry. She is not diaphoretic.  Psychiatric: She has a normal mood and affect. Her behavior is normal. Judgment and thought content normal.          Assessment & Plan:  Back pain with right radiculopathy status post L5-S1 fusion in 2005 with recurrent back pain -treat with Medrol dosepak, Flexeril and pain medication  Controlled type 2 diabetes-stable with metformin and Januvia with hemoglobin A1c is 6%  History of breast cancer-treated with Femara  Fibromyalgia-treated with generic Daypro  GE reflux-treated with generic Protonix  Hyperlipidemia-treated with Crestor and TriCor. Intolerant of Lipitor.  Remote history of SVT-stable with Lanoxin  History of depression-patient is on Effexor for depression and hot flashes  History of insomnia-treated with Ativan  History of dysplastic nevus 2008  Plan: Patient was treated with 6 day Medrol dosepak. FMLA form will be completed for back pain. Return in 6 months for office visit lipid panel liver functions and hemoglobin A1c

## 2013-05-23 ENCOUNTER — Ambulatory Visit
Admission: RE | Admit: 2013-05-23 | Discharge: 2013-05-23 | Disposition: A | Discharge status: Home / Self Care | Payer: 59 | Source: Ambulatory Visit | Attending: Oncology | Admitting: Oncology

## 2013-05-23 DIAGNOSIS — Z853 Personal history of malignant neoplasm of breast: Secondary | ICD-10-CM

## 2013-05-28 ENCOUNTER — Other Ambulatory Visit: Payer: Self-pay | Admitting: Internal Medicine

## 2013-05-29 ENCOUNTER — Telehealth: Payer: Self-pay | Admitting: Oncology

## 2013-06-05 ENCOUNTER — Encounter: Payer: Self-pay | Admitting: Internal Medicine

## 2013-06-05 MED ORDER — SITAGLIPTIN PHOSPHATE 100 MG PO TABS: 100.0000 mg | ORAL_TABLET | Freq: Every day | ORAL | Status: DC

## 2013-06-05 NOTE — Telephone Encounter (Signed)
Patient requesting refill of Januvia 100 mg. Prescribed Januvia 100 mg #90 with one refill to [facility]

## 2013-06-14 ENCOUNTER — Other Ambulatory Visit: Payer: Self-pay | Admitting: Obstetrics and Gynecology

## 2013-07-02 ENCOUNTER — Other Ambulatory Visit: Payer: Self-pay

## 2013-07-02 ENCOUNTER — Other Ambulatory Visit: Payer: Self-pay | Admitting: Internal Medicine

## 2013-07-02 MED ORDER — LORAZEPAM 2 MG PO TABS: 2.0000 mg | ORAL_TABLET | Freq: Every evening | ORAL | Status: DC | PRN

## 2013-07-02 NOTE — Telephone Encounter (Signed)
Refill x 6 months 

## 2013-07-13 ENCOUNTER — Other Ambulatory Visit: Payer: Self-pay

## 2013-07-13 ENCOUNTER — Encounter: Payer: Self-pay | Admitting: Internal Medicine

## 2013-07-13 MED ORDER — ROSUVASTATIN CALCIUM 10 MG PO TABS: 10.0000 mg | ORAL_TABLET | Freq: Every day | ORAL | Status: DC

## 2013-07-19 ENCOUNTER — Other Ambulatory Visit: Payer: 59 | Admitting: Lab

## 2013-07-19 ENCOUNTER — Ambulatory Visit (INDEPENDENT_AMBULATORY_CARE_PROVIDER_SITE_OTHER): Payer: Self-pay | Admitting: Family Medicine

## 2013-07-19 ENCOUNTER — Ambulatory Visit: Payer: 59 | Admitting: Oncology

## 2013-07-19 DIAGNOSIS — E119 Type 2 diabetes mellitus without complications: Secondary | ICD-10-CM

## 2013-07-19 NOTE — Progress Notes (Signed)
Subjective/Objective:  Patient presents today at the [facility] for her 3 month DM follow-up through the employer-sponsored Link to Wellness program. Patient appears to be in good spirits but worries what her A1c is since she "splurged" some during the holidays. Patient saw Dr. Kerby Less in November, and no changes were made at that time. Patient is continuing to work on diet and has increased the amount she is exercising to work on decreasing her A1c and keeping better control of her blood glucose. She is currently taking metformin and Januvia for diabetes. Patient is also very excited about the arrival of her second grandchild in April.  POC A1c (07/12/13): 5.9% (down from 6.0% in September); Blood Pressure 154/108 mm/Hg (Patient states this blood pressure is unusually high); Pulse 77 bpm; Height 5 ft 2 in; Weight 151 lbs; BMI 27.6; Pain Level: 0/10  Patient is checking blood glucose approximately every other day. Reports fasting blood glucose readings of 75-97. She checks post-prandial blood glucose about 2-3 times/week and reports readings of 120-140.  Blood pressure is abnormally high at today's visit. Patient checks blood pressure at home 1-2 times/week and reports readings of 130-140/80-85. Patient states that she had been very active before coming to the appointment today. She will continue to monitor blood pressure at home and report any unusually high readings.    Assessment:  Patient has been doing well with diabetes management. A1c has decreased from 6.0% in September to 5.9% according to POC reading at today's visit. She is watching her diet and admits that it is still hard controlling carbohydrates. She is exercising about 20 minutes daily approximately three times per week by walking. Patient has had no hypoglycemic episodes. Patient is currently checking her fasting blood sugar daily and recalls fasting readings between 75-97 and post-prandial readings between 120-140. She did  not bring her glucometer to appointment today. Reports no episodes of hypoglycemia since last visit, but patient has hypoglycemia awareness. Patient is currently taking metformin and Januvia for diabetes management. Patient is taking ACE-Inhibitor as well as statin. She has had her yearly eye exam with normal results and no change in vision. Patient sees the dentist every 6 months and checks her feet daily.    Plan:  Personal goals for the patient include the following: 1. Lose 3 pounds before next visit.  2. Continue walking 3 days/week.  3. Continue counting carbohydrates/portion control.  4. More sleep!

## 2013-07-25 ENCOUNTER — Ambulatory Visit (HOSPITAL_BASED_OUTPATIENT_CLINIC_OR_DEPARTMENT_OTHER): Payer: 59 | Admitting: Oncology

## 2013-07-25 ENCOUNTER — Other Ambulatory Visit (HOSPITAL_BASED_OUTPATIENT_CLINIC_OR_DEPARTMENT_OTHER): Payer: 59

## 2013-07-25 ENCOUNTER — Other Ambulatory Visit: Payer: Self-pay | Admitting: Emergency Medicine

## 2013-07-25 ENCOUNTER — Telehealth: Payer: Self-pay | Admitting: Oncology

## 2013-07-25 VITALS — BP 145/97 | HR 72 | Temp 98.2°F | Resp 18 | Ht 62.0 in | Wt 157.2 lb

## 2013-07-25 DIAGNOSIS — Z853 Personal history of malignant neoplasm of breast: Secondary | ICD-10-CM

## 2013-07-25 DIAGNOSIS — C50519 Malignant neoplasm of lower-outer quadrant of unspecified female breast: Secondary | ICD-10-CM

## 2013-07-25 DIAGNOSIS — C50919 Malignant neoplasm of unspecified site of unspecified female breast: Secondary | ICD-10-CM

## 2013-07-25 DIAGNOSIS — R232 Flushing: Secondary | ICD-10-CM

## 2013-07-25 LAB — COMPREHENSIVE METABOLIC PANEL (CC13)
ALBUMIN: 3.6 g/dL (ref 3.5–5.0)
ALK PHOS: 30 U/L — AB (ref 40–150)
ALT: 36 U/L (ref 0–55)
AST: 30 U/L (ref 5–34)
Anion Gap: 11 mEq/L (ref 3–11)
BILIRUBIN TOTAL: 0.3 mg/dL (ref 0.20–1.20)
BUN: 18.2 mg/dL (ref 7.0–26.0)
CO2: 24 mEq/L (ref 22–29)
CREATININE: 0.8 mg/dL (ref 0.6–1.1)
Calcium: 9.6 mg/dL (ref 8.4–10.4)
Chloride: 107 mEq/L (ref 98–109)
Glucose: 101 mg/dl (ref 70–140)
Potassium: 4.2 mEq/L (ref 3.5–5.1)
Sodium: 143 mEq/L (ref 136–145)
Total Protein: 6.8 g/dL (ref 6.4–8.3)

## 2013-07-25 LAB — CBC WITH DIFFERENTIAL/PLATELET
BASO%: 1.1 % (ref 0.0–2.0)
Basophils Absolute: 0.1 10*3/uL (ref 0.0–0.1)
EOS ABS: 0.2 10*3/uL (ref 0.0–0.5)
EOS%: 3.7 % (ref 0.0–7.0)
HCT: 36.7 % (ref 34.8–46.6)
HGB: 11.9 g/dL (ref 11.6–15.9)
LYMPH%: 26.6 % (ref 14.0–49.7)
MCH: 28.7 pg (ref 25.1–34.0)
MCHC: 32.5 g/dL (ref 31.5–36.0)
MCV: 88.3 fL (ref 79.5–101.0)
MONO#: 0.5 10*3/uL (ref 0.1–0.9)
MONO%: 7.1 % (ref 0.0–14.0)
NEUT#: 4 10*3/uL (ref 1.5–6.5)
NEUT%: 61.5 % (ref 38.4–76.8)
Platelets: 338 10*3/uL (ref 145–400)
RBC: 4.16 10*6/uL (ref 3.70–5.45)
RDW: 14.7 % — ABNORMAL HIGH (ref 11.2–14.5)
WBC: 6.4 10*3/uL (ref 3.9–10.3)
lymph#: 1.7 10*3/uL (ref 0.9–3.3)

## 2013-07-25 MED ORDER — VENLAFAXINE HCL ER 37.5 MG PO CP24: 37.5000 mg | ORAL_CAPSULE | Freq: Every day | ORAL | Status: DC

## 2013-07-25 NOTE — Progress Notes (Signed)
OFFICE PROGRESS NOTE  CC**  Elby Showers, MD [address] [place] [address]  DIAGNOSIS: 58 year old with h/o left breast ER positive, PR positive, HER-2/neu negative invasive ductal carcinoma of the left breast diagnosed in 2007.    PRIOR THERAPY:  1. Patient underwent screening mammogram on 12/17/2--7 that detected a mass in the left breast.  A diagnostic mammogram on 06/24/2006 showed a 1.2 cm mass in the left breast at 5 o'clock and a biopsy showed invasive mammary carcinoma.  MRI on 07/03/2006 a 1.6 cm lesion in the left breast.    2.  Patient underwent lumpectomy on 07/15/2006 by Dr. Margot Chimes.  He removed a 1.4cm grade 2 lesion of invasive ductal carcinoma.  ER 100%, PR 100%, HER-2/neu negative with a Ki-67 of 25%.  2 sentinel nodes were biopsied and negative.  A bone scan was done in February, 2008 and negative.    3. Radiation therapy from August 30, 2006 through Oct 27, 2006.    4. Patient was placed on Tamoxifen x 2 years in May 2008.  She then was changed to Femara. She has now completed 6 years of Femara. She certainly can come off the therapy.( 07/25/2013)  CURRENT THERAPY: observation  INTERVAL HISTORY: Jill Richardson 58 y.o. female returns for follow up and evaluation of her h/o invasive ductal carcinoma of the left breast. She is doing well.  She does endorse hot flashes thaqt she takes Effexor for.  Otherwise, she denies fevers, chills, night sweats, weight loss, pain, or any further concerns.  Otherwise, a 10 point ROS is negative.    MEDICAL HISTORY: Past Medical History  Diagnosis Date  . Hyperlipidemia   . SVT (supraventricular tachycardia)   . Fibromyalgia   . Breast cancer 2008  . S/P lumbar fusion 2005    L5-S1  . GERD (gastroesophageal reflux disease)     ALLERGIES:  is allergic to lipitor and lodine.  MEDICATIONS:  Current Outpatient Prescriptions  Medication Sig Dispense Refill  . calcium-vitamin D (OSCAL WITH D) 500-200 MG-UNIT per tablet  Take 1 tablet by mouth 2 (two) times daily.      . cholecalciferol (VITAMIN D) 1000 UNITS tablet Take 5,000 Units by mouth daily.      Marland Kitchen DIGOX 0.25 MG tablet TAKE 1 TABLET BY MOUTH DAILY  90 tablet  PRN  . fenofibrate 160 MG tablet Take 160 mg by mouth daily.      Marland Kitchen glucosamine-chondroitin 500-400 MG tablet Take 1 tablet by mouth daily.      Marland Kitchen glucose blood (TRUETEST TEST) test strip 100 each by Other route 4 (four) times daily. Use as instructed  100 each  6  . JANUVIA 100 MG tablet TAKE 1 TABLET BY MOUTH DAILY  90 tablet  3  . letrozole (FEMARA) 2.5 MG tablet Take 1 tablet (2.5 mg total) by mouth daily.  90 tablet  12  . LORazepam (ATIVAN) 1 MG tablet Take 1 mg by mouth at bedtime.      . metFORMIN (GLUCOPHAGE) 500 MG tablet TAKE 1 TABLET BY MOUTH TWICE DAILY  180 tablet  3  . Multiple Vitamin (MULTIVITAMIN) capsule Take 1 capsule by mouth daily.        Marland Kitchen oxaprozin (DAYPRO) 600 MG tablet Take 600 mg by mouth at bedtime.      . pantoprazole (PROTONIX) 40 MG tablet Take 1 tablet (40 mg total) by mouth daily.  30 tablet  3  . ramipril (ALTACE) 2.5 MG capsule TAKE 1  CAPSULE BY MOUTH DAILY  90 capsule  3  . rosuvastatin (CRESTOR) 10 MG tablet Take 1 tablet (10 mg total) by mouth daily.  90 tablet  1  . traMADol (ULTRAM) 50 MG tablet Take 1 tablet (50 mg total) by mouth every 8 (eight) hours as needed for pain.  270 tablet  1  . TRUEPLUS LANCETS 30G MISC 1 each by Does not apply route 4 (four) times daily.  100 each  6  . venlafaxine XR (EFFEXOR-XR) 37.5 MG 24 hr capsule Take 1 capsule (37.5 mg total) by mouth daily.  90 capsule  PRN  . HYDROcodone-acetaminophen (NORCO) 10-325 MG per tablet Take 1 tablet by mouth every 8 (eight) hours as needed for pain.  90 tablet  0   No current facility-administered medications for this visit.    SURGICAL HISTORY:  Past Surgical History  Procedure Laterality Date  . Lumbar fusion  2005  . Ovary removed  1997  . Breast lumpectomy  2008    LEFT    REVIEW  OF SYSTEMS:  General: fatigue (-), night sweats (-), fever (-), pain (-) Lymph: palpable nodes (-) HEENT: vision changes (-), mucositis (-), gum bleeding (-), epistaxis (-) Cardiovascular: chest pain (-), palpitations (-) Pulmonary: shortness of breath (-), dyspnea on exertion (-), cough (-), hemoptysis (-) GI:  Early satiety (-), melena (-), dysphagia (-), nausea/vomiting (-), diarrhea (-) GU: dysuria (-), hematuria (-), incontinence (-) Musculoskeletal: joint swelling (-), joint pain (-), back pain (-) Neuro: weakness (-), numbness (-), headache (-), confusion (-) Skin: Rash (-), lesions (-), dryness (-) Psych: depression (-), suicidal/homicidal ideation (-), feeling of hopelessness (-)   HEALTH MAINTENANCE:  Mammogram 05/22/2012 Colonoscopy 9 years ago Bone Density 05/22/2012 Pap Smear 05/31/2012 Eye Exam 12/2012 Vitamin D 01/04/13 82 Lipid Panel 12/24/2011  PHYSICAL EXAMINATION: Blood pressure 145/97, pulse 72, temperature 98.2 F (36.8 C), temperature source Oral, resp. rate 18, height '5\' 2"'  (1.575 m), weight 157 lb 3.2 oz (71.305 kg). Body mass index is 28.74 kg/(m^2). General: Patient is a well appearing female in no acute distress HEENT: PERRLA, sclerae anicteric no conjunctival pallor, MMM Neck: supple, no palpable adenopathy Lungs: clear to auscultation bilaterally, no wheezes, rhonchi, or rales Cardiovascular: regular rate rhythm, S1, S2, no murmurs, rubs or gallops Abdomen: Soft, non-tender, non-distended, normoactive bowel sounds, no HSM Extremities: warm and well perfused, no clubbing, cyanosis, or edema Skin: No rashes or lesions Neuro: Non-focal Breasts: left lumpectomy no nodularity, left breast no masses, or skin change, right breast no masses nodules or skin changes.   ECOG PERFORMANCE STATUS: 1 - Symptomatic but completely ambulatory      LABORATORY DATA: Lab Results  Component Value Date   WBC 6.4 07/25/2013   HGB 11.9 07/25/2013   HCT 36.7 07/25/2013    MCV 88.3 07/25/2013   PLT 338 07/25/2013      Chemistry      Component Value Date/Time   NA 140 03/19/2013 0921   NA 144 01/04/2013 1606   K 4.4 03/19/2013 0921   K 4.0 01/04/2013 1606   CL 104 03/19/2013 0921   CO2 28 03/19/2013 0921   CO2 27 01/04/2013 1606   BUN 25* 03/19/2013 0921   BUN 22.9 01/04/2013 1606   CREATININE 0.95 03/19/2013 0921   CREATININE 1.1 01/04/2013 1606   CREATININE 0.90 01/07/2012 1200      Component Value Date/Time   CALCIUM 9.5 03/19/2013 0921   CALCIUM 10.3 01/04/2013 1606   ALKPHOS 24* 03/19/2013 3557  ALKPHOS 27* 01/04/2013 1606   AST 26 03/19/2013 0921   AST 24 01/04/2013 1606   ALT 28 03/19/2013 0921   ALT 25 01/04/2013 1606   BILITOT 0.4 03/19/2013 0921   BILITOT 0.26 01/04/2013 1606       RADIOGRAPHIC STUDIES:  No results found.  ASSESSMENT/PLAN:  Patient is a 58 year old female with h/o ER positive, PR positive, HER-2/neu negative invasive ductal carcinoma of the left breast.  She has underwent lumpectomy, radiation followed by Femara.  She has now completed about 7 years of Femara with additional 2 years of tamoxifen. Giving her a total of 9 years of therapy. She certainly can come off of it and she is agreeable. We discussed breast cancer survivorship. She is encouraged to continue exercising taking vitamin D calcium and continue weight-bearing exercises. She will be seen back in one years time     All questions were answered. The patient knows to call the clinic with any problems, questions or concerns. We can certainly see the patient much sooner if necessary.  I spent 20 minutes counseling the patient face to face. The total time spent in the appointment was 30 minutes.  Marcy Panning, MD Medical/Oncology [facility] [phone] (beeper) [phone] (Office)

## 2013-07-30 ENCOUNTER — Other Ambulatory Visit: Payer: Self-pay

## 2013-07-30 MED ORDER — ROSUVASTATIN CALCIUM 10 MG PO TABS: 10.0000 mg | ORAL_TABLET | Freq: Every day | ORAL | Status: DC

## 2013-08-27 ENCOUNTER — Other Ambulatory Visit: Payer: Self-pay | Admitting: Internal Medicine

## 2013-10-09 ENCOUNTER — Other Ambulatory Visit: Payer: Self-pay | Admitting: Internal Medicine

## 2013-10-15 ENCOUNTER — Ambulatory Visit (INDEPENDENT_AMBULATORY_CARE_PROVIDER_SITE_OTHER): Payer: Self-pay | Admitting: Family Medicine

## 2013-10-15 DIAGNOSIS — E119 Type 2 diabetes mellitus without complications: Secondary | ICD-10-CM

## 2013-10-15 NOTE — Progress Notes (Signed)
Subjective/Objective:  Patient presents today at the [facility] for her 3 month DM follow-up through the employer-sponsored [employer] [facility] [employer] program. Patient reports that she is doing fairly well. She has been very busy with the arrival of her grandson in late March as well as taking care of her husband who broke his ankle in early March. Patient saw Dr. Renold Genta in November, and no changes were made at that time. She is scheduled to see Dr. Renold Genta again on May 7th for a follow up visit. Patient is continuing to work on diet and admits that she needs to increase the amount she is exercising to decreasing her A1c and keep better control of her blood glucose. Patient takes metformin for her diabetes medication management.   A1c: 5.9% (07/12/13); BP 157/90 mmHg; P 75 bpm; Ht 5'2"; Wt 154 lbs; BMI 28.2; Pain 0/10  Patient self-reports all fasting readings less than 100. Highest post-prandial reading was 130 per patient recall. Patient reports all home BP readings less than 120/80.   Assessment:  Patient has been doing well with diabetes management. A1c decreased from 6.0% in September to 5.9% in January. Patient has a scheduled appointment with Dr. Renold Genta on 11/01/13 and will have all lab work (including A1c) drawn on 10/30/13. She is watching her diet and admits that it is still hard controlling carbohydrates. She has not been exercising as much lately since her husband broke his ankle in March as she has been required to help him a lot during his recovery. Previously she was walking about 20 minutes daily approximately three times per week. Now that her husband is more independent, patient is planning on resuming exercise approximately 3 days per week. Patient has had no hypoglycemic episodes. Patient is currently checking her fasting blood sugar daily and recalls fasting readings less than 100 and post-prandial readings between 120-130. She has had her yearly eye exam with normal results and no  change in vision. Patient sees the dentist every 6 months and checks her feet daily. Patient takes ACE-inhibitor.   Plan:  Patient's personal goals before next visit are as follows:  1. Exercise 3 days/week.  2. Continue to watch carb intake.  3. Follow up with Caryl Pina at the [facility] on Thursday, July 23rd at 3:45pm.

## 2013-10-30 ENCOUNTER — Other Ambulatory Visit: Payer: 59 | Admitting: Internal Medicine

## 2013-10-30 DIAGNOSIS — E785 Hyperlipidemia, unspecified: Secondary | ICD-10-CM

## 2013-10-30 DIAGNOSIS — E119 Type 2 diabetes mellitus without complications: Secondary | ICD-10-CM

## 2013-10-30 DIAGNOSIS — Z79899 Other long term (current) drug therapy: Secondary | ICD-10-CM

## 2013-10-30 LAB — HEPATIC FUNCTION PANEL
ALT: 25 U/L (ref 0–35)
AST: 22 U/L (ref 0–37)
Albumin: 3.9 g/dL (ref 3.5–5.2)
Alkaline Phosphatase: 24 U/L — ABNORMAL LOW (ref 39–117)
BILIRUBIN TOTAL: 0.4 mg/dL (ref 0.2–1.2)
Bilirubin, Direct: 0.1 mg/dL (ref 0.0–0.3)
Indirect Bilirubin: 0.3 mg/dL (ref 0.2–1.2)
Total Protein: 6.4 g/dL (ref 6.0–8.3)

## 2013-10-30 LAB — LIPID PANEL
CHOL/HDL RATIO: 3.1 ratio
Cholesterol: 154 mg/dL (ref 0–200)
HDL: 49 mg/dL (ref >39)
LDL Cholesterol: 83 mg/dL (ref 0–99)
Triglycerides: 112 mg/dL (ref <150)
VLDL: 22 mg/dL (ref 0–40)

## 2013-10-30 LAB — HEMOGLOBIN A1C
HEMOGLOBIN A1C: 6.5 % — AB (ref <5.7)
Mean Plasma Glucose: 140 mg/dL — ABNORMAL HIGH (ref <117)

## 2013-11-01 ENCOUNTER — Encounter: Payer: Self-pay | Admitting: Internal Medicine

## 2013-11-01 ENCOUNTER — Ambulatory Visit (INDEPENDENT_AMBULATORY_CARE_PROVIDER_SITE_OTHER): Payer: 59 | Admitting: Internal Medicine

## 2013-11-01 VITALS — BP 122/86 | HR 80 | Temp 97.9°F | Wt 153.0 lb

## 2013-11-01 DIAGNOSIS — E119 Type 2 diabetes mellitus without complications: Secondary | ICD-10-CM

## 2013-11-01 DIAGNOSIS — IMO0001 Reserved for inherently not codable concepts without codable children: Secondary | ICD-10-CM

## 2013-11-01 DIAGNOSIS — M545 Low back pain, unspecified: Secondary | ICD-10-CM

## 2013-11-01 DIAGNOSIS — E785 Hyperlipidemia, unspecified: Secondary | ICD-10-CM

## 2013-11-01 DIAGNOSIS — Z853 Personal history of malignant neoplasm of breast: Secondary | ICD-10-CM

## 2013-11-01 DIAGNOSIS — M7918 Myalgia, other site: Secondary | ICD-10-CM

## 2013-11-01 DIAGNOSIS — K219 Gastro-esophageal reflux disease without esophagitis: Secondary | ICD-10-CM

## 2013-11-01 DIAGNOSIS — M797 Fibromyalgia: Secondary | ICD-10-CM

## 2013-11-01 MED ORDER — RAMIPRIL 2.5 MG PO CAPS: 2.5000 mg | ORAL_CAPSULE | Freq: Every day | ORAL | Status: DC

## 2013-11-03 ENCOUNTER — Encounter: Payer: Self-pay | Admitting: Internal Medicine

## 2013-11-03 NOTE — Patient Instructions (Signed)
Reminded about annual diabetic eye exam. She is followed by a pharmacist at [facility] with regard to diabetes. Not able to exercise recently due to taking care of her husband with ankle fracture. Recommend diet exercise and return for physical exam in 6 months.

## 2013-11-03 NOTE — Progress Notes (Signed)
   Subjective:    Patient ID: Jill Richardson, female    DOB: 07-May-1956, 58 y.o.   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 which she enjoys. 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 She has a history of type 2 diabetes mellitus, hyperlipidemia, GE reflux, insomnia, fibromyalgia syndrome, history of breast cancer. She is on Januvia 100 mg daily, metformin, Altase 2.5 mg daily, fenofibrate for hypertriglyceridemia, Crestor for hyperlipidemia, Effexor, digoxin for history of SVT, vitamin D supplement, Daypro for musculoskeletal pain which she has taken for many years, Femara for history of breast cancer, lorazepam, generic Protonix, tramadol. Blood pressure is excellent. She has never been diagnosed with hypertension. Only on Altace for renal protection with history of diabetes. History of lumbar fusion L5-S1 in 2005. Recurrent back pain in 2010 and suffers intermittent back pain.  Follow the cancer Center for invasive ductal carcinoma left breast diagnosed January 2008. Tumor was invasive ductal carcinoma ER/PR positive with evidence of lymphovascular invasion. Nodes were negative. She had radiation and lumpectomy. Was treated initially with tamoxifen is now on Femara. Effexor treat hot flashes.  She now has a new grandchild-a total of 2 now  and is quite excited about that. Unfortunately, her husband who is disabled, recently had a fall and broke his ankle and she was taking care of him for a number of weeks.    Review of Systems     Objective:   Physical Exam Neck is supple. Chest clear to auscultation. Cardiac exam regular rate and rhythm normal S1 and S2. Extremities without edema.       Assessment & Plan:  Controlled type 2 diabetes. A1c has increased from 6.0-6.5%. I think this is due to not being able to be as physically active taking care of husband who had  ankle fracture  Hyperlipidemia-stable on Crestor and fenofibrate  Fibromyalgia  History of breast cancer  GE reflux-stable  History of back pain  Plan: Encouraged diet and exercise. Return in 6 months for physical examination. Continue same medications.

## 2013-12-12 ENCOUNTER — Emergency Department (HOSPITAL_COMMUNITY): Payer: 59

## 2013-12-12 ENCOUNTER — Encounter (HOSPITAL_COMMUNITY): Payer: Self-pay | Admitting: Emergency Medicine

## 2013-12-12 ENCOUNTER — Emergency Department (HOSPITAL_COMMUNITY)
Admission: EM | Admit: 2013-12-12 | Discharge: 2013-12-13 | Disposition: A | Discharge status: Home / Self Care | Payer: 59 | Attending: Emergency Medicine | Admitting: Emergency Medicine

## 2013-12-12 DIAGNOSIS — K219 Gastro-esophageal reflux disease without esophagitis: Secondary | ICD-10-CM | POA: Insufficient documentation

## 2013-12-12 DIAGNOSIS — R51 Headache: Secondary | ICD-10-CM | POA: Insufficient documentation

## 2013-12-12 DIAGNOSIS — Z853 Personal history of malignant neoplasm of breast: Secondary | ICD-10-CM | POA: Insufficient documentation

## 2013-12-12 DIAGNOSIS — Z981 Arthrodesis status: Secondary | ICD-10-CM | POA: Insufficient documentation

## 2013-12-12 DIAGNOSIS — Z79899 Other long term (current) drug therapy: Secondary | ICD-10-CM | POA: Insufficient documentation

## 2013-12-12 DIAGNOSIS — I498 Other specified cardiac arrhythmias: Secondary | ICD-10-CM | POA: Insufficient documentation

## 2013-12-12 DIAGNOSIS — E785 Hyperlipidemia, unspecified: Secondary | ICD-10-CM | POA: Insufficient documentation

## 2013-12-12 DIAGNOSIS — R519 Headache, unspecified: Secondary | ICD-10-CM

## 2013-12-12 LAB — I-STAT CHEM 8, ED
BUN: 21 mg/dL (ref 6–23)
Calcium, Ion: 1.25 mmol/L (ref 1.13–1.30)
Chloride: 109 mEq/L (ref 96–112)
Creatinine, Ser: 1 mg/dL (ref 0.50–1.10)
Glucose, Bld: 94 mg/dL (ref 70–99)
HCT: 44 % (ref 36.0–46.0)
HEMOGLOBIN: 15 g/dL (ref 12.0–15.0)
POTASSIUM: 3.4 meq/L — AB (ref 3.7–5.3)
SODIUM: 144 meq/L (ref 137–147)
TCO2: 21 mmol/L (ref 0–100)

## 2013-12-12 LAB — BASIC METABOLIC PANEL
BUN: 21 mg/dL (ref 6–23)
CO2: 21 mEq/L (ref 19–32)
Calcium: 10.3 mg/dL (ref 8.4–10.5)
Chloride: 103 mEq/L (ref 96–112)
Creatinine, Ser: 0.89 mg/dL (ref 0.50–1.10)
GFR, EST AFRICAN AMERICAN: 80 mL/min — AB (ref >90)
GFR, EST NON AFRICAN AMERICAN: 69 mL/min — AB (ref >90)
Glucose, Bld: 90 mg/dL (ref 70–99)
POTASSIUM: 3.6 meq/L — AB (ref 3.7–5.3)
SODIUM: 143 meq/L (ref 137–147)

## 2013-12-12 LAB — CBC
HCT: 39.7 % (ref 36.0–46.0)
HEMOGLOBIN: 13 g/dL (ref 12.0–15.0)
MCH: 28.4 pg (ref 26.0–34.0)
MCHC: 32.7 g/dL (ref 30.0–36.0)
MCV: 86.9 fL (ref 78.0–100.0)
PLATELETS: 376 10*3/uL (ref 150–400)
RBC: 4.57 MIL/uL (ref 3.87–5.11)
RDW: 15.2 % (ref 11.5–15.5)
WBC: 8 10*3/uL (ref 4.0–10.5)

## 2013-12-12 LAB — CBG MONITORING, ED: Glucose-Capillary: 96 mg/dL (ref 70–99)

## 2013-12-12 MED ORDER — SODIUM CHLORIDE 0.9 % IV BOLUS (SEPSIS)
1000.0000 mL | Freq: Once | INTRAVENOUS | Status: AC
  Administered 2013-12-12: 1000 mL via INTRAVENOUS

## 2013-12-12 MED ORDER — DIVALPROEX SODIUM 250 MG PO DR TAB
500.0000 mg | DELAYED_RELEASE_TABLET | Freq: Two times a day (BID) | ORAL | Status: DC
  Administered 2013-12-12: 500 mg via ORAL
  Filled 2013-12-12: qty 2

## 2013-12-12 MED ORDER — MAGNESIUM SULFATE 40 MG/ML IJ SOLN
2.0000 g | Freq: Once | INTRAMUSCULAR | Status: AC
  Administered 2013-12-12: 2 g via INTRAVENOUS
  Filled 2013-12-12: qty 50

## 2013-12-12 MED ORDER — IOHEXOL 350 MG/ML SOLN
50.0000 mL | Freq: Once | INTRAVENOUS | Status: AC | PRN
  Administered 2013-12-12: 50 mL via INTRAVENOUS

## 2013-12-12 MED ORDER — METOCLOPRAMIDE HCL 5 MG/ML IJ SOLN
10.0000 mg | Freq: Once | INTRAMUSCULAR | Status: AC
  Administered 2013-12-12: 10 mg via INTRAVENOUS
  Filled 2013-12-12: qty 2

## 2013-12-12 MED ORDER — SODIUM CHLORIDE 0.9 % IV SOLN
1000.0000 mL | Freq: Once | INTRAVENOUS | Status: AC
  Administered 2013-12-12: 1000 mL via INTRAVENOUS

## 2013-12-12 MED ORDER — MORPHINE SULFATE 4 MG/ML IJ SOLN
4.0000 mg | INTRAMUSCULAR | Status: DC | PRN
  Administered 2013-12-12: 4 mg via INTRAVENOUS
  Filled 2013-12-12: qty 1

## 2013-12-12 NOTE — ED Notes (Signed)
The pt  Returned from c-t med given

## 2013-12-12 NOTE — ED Notes (Signed)
Pt placed into gown and on monitor upon arrival to room. Pt monitored by 12 lead, blood pressure, and pulse ox.  

## 2013-12-12 NOTE — ED Provider Notes (Signed)
CSN: [id number]     Arrival date & time 12/12/13  1916 History   First MD Initiated Contact with Patient 12/12/13 1916     Chief Complaint  Patient presents with  . Headache     (Consider location/radiation/quality/duration/timing/severity/associated sxs/prior Treatment) Patient is a 58 y.o. female presenting with headaches.  Headache Pain location:  Generalized Quality:  Sharp Severity currently:  10/10 Severity at highest:  10/10 Onset quality:  Sudden Duration: 15 minutes. Timing:  Constant Progression:  Unchanged Chronicity:  New Similar to prior headaches: no   Context comment:  While sitting and working Relieved by:  None tried Worsened by:  Nothing tried Ineffective treatments:  None tried Associated symptoms: no abdominal pain, no back pain, no congestion, no cough, no dizziness, no fever, no focal weakness, no myalgias, no syncope and no tingling   Risk factors: no anger     Past Medical History  Diagnosis Date  . Hyperlipidemia   . SVT (supraventricular tachycardia)   . Fibromyalgia   . Breast cancer 2008  . S/P lumbar fusion 2005    L5-S1  . GERD (gastroesophageal reflux disease)    Past Surgical History  Procedure Laterality Date  . Lumbar fusion  2005  . Ovary removed  1997  . Breast lumpectomy  2008    LEFT   Family History  Problem Relation Age of Onset  . Diabetes Sister   . Hypertension Brother   . Hypertension Mother   . Diabetes Father   . Hypertension Father     pulmonary HTN  . Cancer Father   . Kidney disease Father    History  Substance Use Topics  . Smoking status: Never Smoker   . Smokeless tobacco: Not on file  . Alcohol Use: No   OB History   Grav Para Term Preterm Abortions TAB SAB Ect Mult Living                 Review of Systems  Constitutional: Negative for fever and activity change.  HENT: Negative for congestion and facial swelling.   Eyes: Negative for discharge and redness.  Respiratory: Negative for cough and  shortness of breath.   Cardiovascular: Negative for chest pain, palpitations and syncope.  Gastrointestinal: Negative for abdominal pain and abdominal distention.  Endocrine: Negative for polydipsia and polyuria.  Genitourinary: Negative for dysuria and menstrual problem.  Musculoskeletal: Negative for back pain, joint swelling and myalgias.  Skin: Negative for color change and wound.  Neurological: Positive for headaches. Negative for dizziness, focal weakness and light-headedness.      Allergies  Lipitor and Lodine  Home Medications   Prior to Admission medications   Medication Sig Start Date End Date Taking? Authorizing Provider  calcium-vitamin D (OSCAL WITH D) 500-200 MG-UNIT per tablet Take 1 tablet by mouth 2 (two) times daily.   Yes Historical Provider, MD  digoxin (LANOXIN) 0.25 MG tablet Take 0.25 mg by mouth daily.   Yes Historical Provider, MD  fenofibrate 160 MG tablet Take 160 mg by mouth daily.   Yes Historical Provider, MD  glucosamine-chondroitin 500-400 MG tablet Take 1 tablet by mouth daily.   Yes Historical Provider, MD  HYDROcodone-acetaminophen (NORCO) 10-325 MG per tablet Take 1 tablet by mouth every 8 (eight) hours as needed for pain. 04/30/13  Yes Elby Showers, MD  letrozole [facility]) 2.5 MG tablet Take 1 tablet (2.5 mg total) by mouth daily. 01/11/13  Yes Minette Headland, NP  LORazepam (ATIVAN) 1 MG tablet Take 1  mg by mouth at bedtime as needed (hot flashes).    Yes Historical Provider, MD  metFORMIN (GLUCOPHAGE) 500 MG tablet Take 500 mg by mouth 2 (two) times daily with a meal.   Yes Historical Provider, MD  Multiple Vitamin (MULTIVITAMIN) capsule Take 1 capsule by mouth daily.     Yes Historical Provider, MD  oxaprozin (DAYPRO) 600 MG tablet Take 1,200 mg by mouth at bedtime.   Yes Historical Provider, MD  pantoprazole (PROTONIX) 40 MG tablet Take 40 mg by mouth daily.   Yes Historical Provider, MD  ramipril (ALTACE) 2.5 MG capsule Take 1 capsule (2.5 mg  total) by mouth daily. 11/01/13  Yes Elby Showers, MD  rosuvastatin (CRESTOR) 10 MG tablet Take 1 tablet (10 mg total) by mouth daily. 07/30/13  Yes Elby Showers, MD  sitaGLIPtin (JANUVIA) 100 MG tablet Take 100 mg by mouth daily.   Yes Historical Provider, MD  traMADol (ULTRAM) 50 MG tablet Take 1 tablet (50 mg total) by mouth every 8 (eight) hours as needed for pain. 08/22/12  Yes Elby Showers, MD  venlafaxine XR (EFFEXOR-XR) 37.5 MG 24 hr capsule Take 1 capsule (37.5 mg total) by mouth daily. 07/25/13  Yes Deatra Robinson, MD   BP 159/78  Pulse 68  Temp(Src) 98.1 F (36.7 C) (Oral)  Resp 16  Ht 5\' 2"  (1.575 m)  Wt 150 lb (68.04 kg)  BMI 27.43 kg/m2  SpO2 100% Physical Exam  Nursing note and vitals reviewed. Constitutional: She is oriented to person, place, and time. She appears well-developed and well-nourished.  HENT:  Head: Normocephalic and atraumatic.  Eyes: Conjunctivae and EOM are normal. Right eye exhibits no discharge. Left eye exhibits no discharge.  Cardiovascular: Normal rate and regular rhythm.   Pulmonary/Chest: Effort normal and breath sounds normal. No respiratory distress.  Abdominal: Soft. She exhibits no distension. There is no tenderness. There is no rebound.  Musculoskeletal: Normal range of motion. She exhibits no edema and no tenderness.  Neurological: She is alert and oriented to person, place, and time.  No altered mental status, able to give full seemingly accurate history.  Face is symmetric, EOM's intact, pupils equal and reactive, vision intact, tongue and uvula midline without deviation Upper and Lower extremity motor 5/5, intact pain perception in distal extremities, 2+ reflexes in biceps, patella and achilles tendons.   Skin: Skin is warm and dry.    ED Course  Procedures (including critical care time) Labs Review Labs Reviewed  BASIC METABOLIC PANEL - Abnormal; Notable for the following:    Potassium 3.6 (*)    GFR calc non Af Amer 69 (*)    GFR  calc Af Amer 80 (*)    All other components within normal limits  I-STAT CHEM 8, ED - Abnormal; Notable for the following:    Potassium 3.4 (*)    All other components within normal limits  CBC  CBG MONITORING, ED    Imaging Review Ct Angio Head W/cm &/or Wo Cm  12/12/2013   CLINICAL DATA:  Headache  EXAM: CT ANGIOGRAPHY HEAD  TECHNIQUE: Multidetector CT imaging of the head was performed using the standard protocol during bolus administration of intravenous contrast. Multiplanar CT image reconstructions and MIPs were obtained to evaluate the vascular anatomy.  CONTRAST:  69mL OMNIPAQUE IOHEXOL 350 MG/ML SOLN  COMPARISON:  Prior CT performed earlier on the same day.  FINDINGS: The distal cervical segments of the internal carotid arteries are widely patent bilaterally. The petrous, cavernous,  and supra clinoid segments are well opacified. Minimal calcified atherosclerotic plaque present within the cavernous segment of the right internal carotid artery. No high-grade flow-limiting stenosis within the internal carotid arteries.  The A1 segments are within normal limits. Anterior communicating artery is normal. Anterior cerebral arteries are well opacified.  The middle cerebral arteries and their distal branches are well opacified without proximal branch occlusion, high-grade flow-limiting stenosis, or other acute abnormality.  No intracranial aneurysm within the anterior circulation.  The vertebral arteries are codominant. Posterior inferior cerebral arteries are within normal limits. Vertebrobasilar junction and basilar artery are unremarkable. Posterior cerebral arteries and superior cerebellar arteries are well opacified bilaterally.  No intracranial aneurysm within the posterior circulation.  Review of the MIP images confirms the above findings.  IMPRESSION: Normal CTA of the brain with no intracranial aneurysm or other acute abnormality identified.   Electronically Signed   By: Jeannine Boga M.D.    On: 12/12/2013 23:33   Ct Head Wo Contrast  12/12/2013   CLINICAL DATA:  Headache  EXAM: CT HEAD WITHOUT CONTRAST  TECHNIQUE: Contiguous axial images were obtained from the base of the skull through the vertex without intravenous contrast.  COMPARISON:  None.  FINDINGS: No acute intracranial abnormality. Specifically, no hemorrhage, hydrocephalus, mass lesion, acute infarction, or significant intracranial injury. No acute calvarial abnormality. Mild areas of low attenuation within the subcortical, deep, and periventricular white matter regions consistent with small vessel white matter ischaemia. Visualized paranasal sinuses and mastoid air cells are patent.  IMPRESSION: Chronic changes otherwise no acute intracranial abnormality.   Electronically Signed   By: [name] M.D.   On: 12/12/2013 20:13     EKG Interpretation None      MDM   Final diagnoses:  Headache    Pertinent positives and negatives from above HPI, ROS and PE include: 58 yo F w/ acute onset of worst headache of her life just PTA. No recent illnesses or h/o same. Initially hypertensive, improved with pain meds. Neuro exam non focal. Concern for ICH however ct head wo negative. Headache improved substantially with headache cocktail. 2/2 age also got CTA to eval for aneurysms or other concerning findings and it was negative  On re-evaluation patients VS were stable/still neuro intact, headache improved, family at bedside. Stable for d/c with neurology follow up.   Discharge instructions, including strict return precautions for new or worsening symptoms, given. Patient and/or family verbalized understanding and agreement with the plan as described.   Labs, studies and imaging reviewed by myself and considered in medical decision making if ordered. Imaging interpreted by radiology. Pt was discussed with my attending, Dr. Jeneen Rinks.  Discharge Medication List as of 12/12/2013 11:39 PM      Follow-up Information   Follow up  with Elby Showers, MD. (As needed)    Specialty:  Internal Medicine   Contact information:   [address] [address] [phone]       Follow up with [facility]. Schedule an appointment as soon as possible for a visit in 1 week. (ED follow up for headache)    Contact information:   [address] [place] [address] [phone]         Merrily Pew, MD 12/13/13 [phone]

## 2013-12-12 NOTE — ED Notes (Signed)
The pt reports that she feels better

## 2013-12-12 NOTE — ED Notes (Signed)
Still has pain

## 2013-12-12 NOTE — ED Notes (Signed)
Pt up to the br.  c-t angio will be done soon.

## 2013-12-12 NOTE — ED Notes (Signed)
Getting more med.  bp decreased

## 2013-12-12 NOTE — ED Provider Notes (Signed)
Pt seen with Dr. Dayna Barker on arrival.  Sudden onset of headache. She works as a Loss adjuster, [employer]. She was simply talking with the family of the patient when she developed sudden posterior headache. No nausea, no vision changes. No neurological symptoms. CT is normal. CT angiogram is normal. She has no neck symptoms. She is not febrile. She has a normal neurological exam. She is improving and nearly symptom-free. She is appropriate for discharge without further studies. Asked to return if any recurrence of her symptoms.  Tanna Furry, MD 12/12/13 2350

## 2013-12-12 NOTE — ED Notes (Signed)
The pt was brought down from 2900.  She was getting off work and had a sudden headache with n no vomiting.  No history of headaches

## 2013-12-12 NOTE — ED Notes (Signed)
reglan given

## 2013-12-12 NOTE — ED Notes (Signed)
To ct

## 2013-12-16 NOTE — ED Provider Notes (Signed)
I saw and evaluated the patient, reviewed the resident's note and I agree with the findings and plan.   EKG Interpretation   Date/Time:  Wednesday December 12 2013 19:22:42 EDT Ventricular Rate:  92 PR Interval:  173 QRS Duration: 87 QT Interval:  406 QTC Calculation: 502 R Axis:   83 Text Interpretation:  Sinus rhythm Borderline right axis deviation  Borderline repolarization abnormality Borderline prolonged QT interval  Baseline wander in lead(s) V6 ED PHYSICIAN INTERPRETATION AVAILABLE IN  [facility] Confirmed by TEST, Record ([id number]) on 12/14/2013 8:34:05 AM      Pt seen and evaluated face to face.  Please see my additional note above.  Tanna Furry, MD 12/16/13 [id number]

## 2013-12-20 ENCOUNTER — Ambulatory Visit (INDEPENDENT_AMBULATORY_CARE_PROVIDER_SITE_OTHER): Payer: 59 | Admitting: Internal Medicine

## 2013-12-20 ENCOUNTER — Encounter: Payer: Self-pay | Admitting: Internal Medicine

## 2013-12-20 VITALS — BP 128/78 | HR 80 | Wt 152.0 lb

## 2013-12-20 DIAGNOSIS — R519 Headache, unspecified: Secondary | ICD-10-CM

## 2013-12-20 DIAGNOSIS — R51 Headache: Secondary | ICD-10-CM

## 2013-12-20 NOTE — Patient Instructions (Signed)
Plan MRI of the brain with contrast.

## 2013-12-20 NOTE — Progress Notes (Signed)
   Subjective:    Patient ID: Jill Richardson, female    DOB: 18-Dec-1955, 58 y.o.   MRN: [id number]  HPI  Patient was seen in the emergency department on an acute basis June 17 with a severe headache that started at the end of her shift around 7 PM. The patient had felt fatigued and that day. She has a history of diabetes and thought perhaps her Accu-Chek was low. She did not have an Accu-Chek machine with her at work. She took 2 glucose tablets around 4 PM and felt a bit better. However around 7 PM she had a severe pain with sudden onset in the upper occipital part of her head. She felt as if she would pass out. She had to call for some assistance. She was taken to the emergency department by coworkers where she received Reglan, morphine, magnesium sulfate, Depakote. It took about 3 hours to even obtain some relief. She did not vomit. CT scan with contrast showed no evidence of subarachnoid hemorrhage. The patient had a migraine headache 3 or 4 years ago but none since. That particular headache was associated with photophobia. She says this headache was much different and she truly thought she was having a ruptured aneurysm.  The next day she felt weak and washed out and did not go to work. Felt her thinking was not completely clear. Since then has returned to work without any further episodes of headache.  She has a history of breast cancer, history of type 2 diabetes, metabolic syndrome, hyperlipidemia, GE reflux, fibromyalgia.   See emergency department records  Review of Systems     Objective:   Physical Exam PERRLA. Extraocular movements are full. Funduscopic exam shows the disc to be sharp and flat bilaterally. Vasculature is normal. Cranial nerves II through XII are grossly intact. Neck is supple without thyromegaly or carotid bruits. Chest clear. Cardiac exam regular rate and rhythm normal S1 and S2. Muscle strength is 5 over 5 in all groups tested. Cerebellar finger to nose testing is within  normal limits. Sensation is within normal limits. Gait is normal.        Assessment & Plan:   Unexplained severe headache onset at work June 17. Patient has history of breast cancer. She has no history of hypertension. Blood pressure is within normal limits. She has a remote history of migraine headache but has not had one in several years and this headache was much different than the typical migraine. I think it would be best to patient had MRI of the brain with contrast. She agrees with this.

## 2013-12-21 NOTE — Addendum Note (Signed)
Addended by: Brett Canales on: 12/21/2013 04:01 PM   Modules accepted: Orders

## 2013-12-30 ENCOUNTER — Ambulatory Visit
Admission: RE | Admit: 2013-12-30 | Discharge: 2013-12-30 | Disposition: A | Discharge status: Home / Self Care | Payer: 59 | Source: Ambulatory Visit | Attending: Internal Medicine | Admitting: Internal Medicine

## 2013-12-30 DIAGNOSIS — R519 Headache, unspecified: Secondary | ICD-10-CM

## 2013-12-30 DIAGNOSIS — R51 Headache: Principal | ICD-10-CM

## 2013-12-30 MED ORDER — GADOBENATE DIMEGLUMINE 529 MG/ML IV SOLN
14.0000 mL | Freq: Once | INTRAVENOUS | Status: AC | PRN
  Administered 2013-12-30: 14 mL via INTRAVENOUS

## 2014-01-03 ENCOUNTER — Telehealth: Payer: Self-pay | Admitting: Hematology and Oncology

## 2014-01-09 LAB — HM DIABETES EYE EXAM

## 2014-01-17 ENCOUNTER — Ambulatory Visit (INDEPENDENT_AMBULATORY_CARE_PROVIDER_SITE_OTHER): Payer: Self-pay | Admitting: Family Medicine

## 2014-01-17 ENCOUNTER — Other Ambulatory Visit: Payer: Self-pay | Admitting: Internal Medicine

## 2014-01-17 VITALS — BP 132/78 | Wt 151.0 lb

## 2014-01-17 DIAGNOSIS — E119 Type 2 diabetes mellitus without complications: Secondary | ICD-10-CM

## 2014-01-17 NOTE — Progress Notes (Signed)
Patient presents today for 3 month diabetes follow-up as part of the employer-sponsored Link to [employer]. She is currently seen by Jill Richardson, PharmD, but will be seen by me today due to Jill Richardson's absence today. Current diabetes regimen includes Metformin and Januvia. Patient also continues on daily ACEi and statin. Most recent MD follow-up was March 2015. Patient has a pending appt for Sept 2015, every 6 months. No med changes or major health changes at this time.  Diabetes Assessment: Type of Diabetes: Type 2; Year of diagnosis 2013; MD managing Diabetes Dr. Renold Genta; Current Diabetes related medical conditions are High cholesterol; checks feet daily; uses glucometer; takes medications as prescribed; does not take an aspirin a day; checks blood glucose 2-6 times a week; What is target A1c? %; hypoglycemia frequency 0; 7 day CBG average Did not bring meter to appointment; Sees Diabetes provider 2 times per year; Lowest CBG 75; Highest CBG 140  Other Diabetes History: Current med regimen includes Metformin 500 mg twice daily, and Januvia 100 mg daily. Patient does maintain good medication compliance. Patient did not bring meter today but is currently testing 2-3 times per week. Glucose monitoring occurs fasting, occassionally after meals and if symptomatic. Per patient report, fasting glucose is generally 75-100, and post-prandial readings are generally <140. Hypoglycemia frequency is rare. Patient does demonstrate appropriate correction of hypoglycemia, and carries glucose tabs at all times. Patient denies signs and symptoms of neuropathy including numbness/tingling/burning and symptoms of foot infection. Patient is not due for yearly eye exam, and had annual exam last week, July 2015. Patient's A1c had increased from 5.9 to 6.5 at most recent MD appt. Pt was disappointed with this, but hopes to keep A1c at 6.5 or under. If A1c continues to increase, could consider increasing Metformin as next step, given she  is below max dose.  Lifestyle Factors: Diet - Patient reports a fairlyl healthy diet, but continues to work toward limiting carbs and portion sizes. Patient admits she loves carbs, especially pasta,but has limited pasta to once per week. Chooses Dream[employer] brand for lower glycemic index, as recommended by dietician. Also tries to limit potatoes and breads.  Food recall for last 24 hours Breakfast: belvita breakfast cookies and water AM snacks: light and fit greek yogurt Lunch: packs lunch some and sometimes eats cafeteria. Chili with beans, cornbread, and watermelon. PM snack: fruit Supper: cooks at [employer] to bake and grill more vs fry Bedtime snacks: none 4 servings of vegetables daily on average Beverages: water, diet soda (1/2 can), unsweet tea with dinner, flavored water Exercise: gardening daily, walking around property on occassion. 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 In the past, she has worn a pedometer and has logged up to 10,000 steps per day at work. I have encouraged her to add a short walk either before or after her daily gardening.   Assessment: Patient presents today with elevated A1c of 6.5 (prev 5.9), but remains at goal. She is working to limit starchy carbs, but continues to need improvement in this area as well as overall portion control. 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 Patient is overall under good control and will continue to follow-up quarterly.  Plan: 1) Continue making healthy dietary choices, and limit portion sizes and starchy carbohydrates 2) Continue to stay as active as possible, consider adding a short walk before or after your gardening 3) Conintue to stay  compliant with medications 4) Continue testing at least three times per week 5) Follow-up in 3 months on Thursday Oct 29th @ 4:00 pm

## 2014-01-24 ENCOUNTER — Other Ambulatory Visit: Payer: 59

## 2014-01-24 ENCOUNTER — Ambulatory Visit: Payer: 59 | Admitting: Oncology

## 2014-01-24 ENCOUNTER — Telehealth: Payer: Self-pay | Admitting: Hematology and Oncology

## 2014-01-30 ENCOUNTER — Encounter: Payer: Self-pay | Admitting: Family Medicine

## 2014-01-30 NOTE — Progress Notes (Signed)
Patient ID: Jill Richardson, female   DOB: 1955-12-15, 58 y.o.   MRN: [id number] Reviewed: Agree with our pharmacologist's documentation and management.

## 2014-01-30 NOTE — Progress Notes (Signed)
Patient ID: Jill Richardson, female   DOB: [birth year], 58 y.o.   MRN: [id number] Reviewed: Agree with our pharmacologist's documentation and management.

## 2014-02-11 ENCOUNTER — Encounter: Payer: Self-pay | Admitting: Family Medicine

## 2014-02-11 NOTE — Progress Notes (Signed)
Patient ID: Jill Richardson, female   DOB: September 23, 1955, 58 y.o.   MRN: [id number] Reviewed:  Agree with the documentation and management of our pharmacologist.

## 2014-02-18 ENCOUNTER — Other Ambulatory Visit: Payer: Self-pay | Admitting: Adult Health

## 2014-02-21 ENCOUNTER — Encounter: Payer: Self-pay | Admitting: Family Medicine

## 2014-02-21 NOTE — Progress Notes (Signed)
Patient ID: Jill Richardson, female   DOB: Jan 28, 1956, 58 y.o.   MRN: [id number] Reviewed: Agree with the documentation and management by my [place].

## 2014-03-18 ENCOUNTER — Other Ambulatory Visit: Payer: Self-pay

## 2014-03-18 DIAGNOSIS — Z1231 Encounter for screening mammogram for malignant neoplasm of breast: Secondary | ICD-10-CM

## 2014-04-10 ENCOUNTER — Telehealth: Payer: Self-pay | Admitting: Hematology and Oncology

## 2014-04-10 NOTE — Telephone Encounter (Signed)
r/s 10/15 appt from 10/15 to 11/16 per pt - pt has new d/t

## 2014-04-11 ENCOUNTER — Ambulatory Visit: Payer: 59 | Admitting: Hematology and Oncology

## 2014-04-11 ENCOUNTER — Other Ambulatory Visit: Payer: 59

## 2014-04-15 ENCOUNTER — Telehealth: Payer: Self-pay | Admitting: Hematology and Oncology

## 2014-04-15 NOTE — Telephone Encounter (Signed)
, °

## 2014-04-25 ENCOUNTER — Ambulatory Visit (INDEPENDENT_AMBULATORY_CARE_PROVIDER_SITE_OTHER): Payer: Self-pay | Admitting: Family Medicine

## 2014-04-25 VITALS — BP 128/74 | Wt 150.0 lb

## 2014-04-25 DIAGNOSIS — E118 Type 2 diabetes mellitus with unspecified complications: Secondary | ICD-10-CM

## 2014-04-30 NOTE — Progress Notes (Signed)
Patient presents today for 3 month diabetes follow-up as part of the employer-sponsored Link to [facility] program. Current diabetes regimen includes Metformin and Januvia. Patient also continues on daily ACEi and statin. Most recent MD follow-up was May 2015. Patient has a pending appt for Nov 2015, every 6 months. No med changes or major health changes at this time.  Diabetes Assessment: Type of Diabetes: Type 2; Year of diagnosis 2013; MD managing Diabetes Dr. Renold Genta; Current Diabetes related medical conditions are High cholesterol; checks feet daily; uses glucometer; takes medications as prescribed; does not take an aspirin a day; checks blood glucose 2-6 times a week; hypoglycemia frequency 0; Sees Diabetes provider 2 times per year; Lowest CBG 75; Highest CBG 140; Other Diabetes History: Current med regimen includes Metformin 500 mg twice daily, and Januvia 100 mg daily. Patient does maintain good medication compliance. Patient did not bring meter today but is currently testing 2-3 times per week. Glucose monitoring occurs fasting, occassionally after meals, before bed, and if symptomatic. Per patient report, fasting glucose is generally 80-100, and before bed readings are generally 110-130. Hypoglycemia frequency is rare. Patient does demonstrate appropriate correction of hypoglycemia, and carries glucose tabs at all times. Patient denies signs and symptoms of neuropathy including numbness/tingling/burning and symptoms of foot infection. Patient is not due for yearly eye exam, and had annual exam last July 2015. Patient's A1c had increased from 5.9 to 6.5 at most recent MD appt. Pt was disappointed with this, but hopes to keep A1c at 6.5 or under. If A1c continues to increase, could consider increasing Metformin as next step, given she is below max dose.  Lifestyle Factors: Diet - Patient aware of need to make healthier choices. Patient eats out couple of times per week at restaurants, but attempts to  make healthy choices. Patient reports a fairly healthy diet, but continues to work toward limiting carbs and portion sizes. Patient admits she loves carbs, especially pasta,but has limited pasta to once per week. Chooses Dream[employer] brand for lower glycemic index, as recommended by dietician. Also tries to limit potatoes and breads.  Food recall for last 24 hours Breakfast: belvita breakfast cookies and water AM snacks: light and fit greek yogurt Lunch: packs lunch some and sometimes eats cafeteria. Chili with beans, cornbread, and watermelon. PM snack: fruit Supper: cooks at [employer] to bake and grill more vs fry Bedtime snacks: none 4 servings of vegetables daily on average Beverages: water, diet soda (1/2 can), unsweet tea with dinner, flavored water Exercise: Very little routine exercise at this time. Patient does enjoy gardening but is doing that less now after season change. She continues walking around property on occasion and keeps her two grandchildren twice per week. 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 I have encouraged her to add a short walk during the day.   Assessment: Patient presents today for DM follow-up. She is working to limit starchy carbs, but continues to need improvement in this area as well as overall portion control. 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 Patient is overall under good control and will continue to follow-up quarterly.  Plan: 1) Continue making healthy dietary choices, and limit portion sizes and starchy carbohydrates 2) Continue to stay as active as possible, consider adding an additional walk per week 3) Continue to stay compliant with medications 4) Continue testing at least three times per week 5) Follow-up  in 3 months on Thursday Jan 28th @ 4:00 pm

## 2014-05-06 ENCOUNTER — Ambulatory Visit: Payer: 59 | Admitting: Hematology and Oncology

## 2014-05-06 ENCOUNTER — Other Ambulatory Visit: Payer: 59

## 2014-05-06 ENCOUNTER — Other Ambulatory Visit: Payer: Self-pay | Admitting: Internal Medicine

## 2014-05-06 NOTE — Telephone Encounter (Signed)
Ativan called into [facility] pharmacy.

## 2014-05-06 NOTE — Telephone Encounter (Signed)
Refill x 6 months 

## 2014-05-07 ENCOUNTER — Other Ambulatory Visit: Payer: 59 | Admitting: Internal Medicine

## 2014-05-07 DIAGNOSIS — E785 Hyperlipidemia, unspecified: Secondary | ICD-10-CM

## 2014-05-07 DIAGNOSIS — I10 Essential (primary) hypertension: Secondary | ICD-10-CM

## 2014-05-07 DIAGNOSIS — Z1329 Encounter for screening for other suspected endocrine disorder: Secondary | ICD-10-CM

## 2014-05-07 DIAGNOSIS — E119 Type 2 diabetes mellitus without complications: Secondary | ICD-10-CM

## 2014-05-07 DIAGNOSIS — Z0000 Encounter for general adult medical examination without abnormal findings: Secondary | ICD-10-CM

## 2014-05-07 DIAGNOSIS — Z1321 Encounter for screening for nutritional disorder: Secondary | ICD-10-CM

## 2014-05-07 LAB — LIPID PANEL
Cholesterol: 139 mg/dL (ref 0–200)
HDL: 46 mg/dL (ref >39)
LDL CALC: 65 mg/dL (ref 0–99)
Total CHOL/HDL Ratio: 3 Ratio
Triglycerides: 138 mg/dL (ref <150)
VLDL: 28 mg/dL (ref 0–40)

## 2014-05-07 LAB — CBC WITH DIFFERENTIAL/PLATELET
BASOS ABS: 0.1 10*3/uL (ref 0.0–0.1)
BASOS PCT: 1 % (ref 0–1)
Eosinophils Absolute: 0.3 10*3/uL (ref 0.0–0.7)
Eosinophils Relative: 5 % (ref 0–5)
HCT: 38.7 % (ref 36.0–46.0)
HEMOGLOBIN: 12.3 g/dL (ref 12.0–15.0)
Lymphocytes Relative: 19 % (ref 12–46)
Lymphs Abs: 1.3 10*3/uL (ref 0.7–4.0)
MCH: 27.7 pg (ref 26.0–34.0)
MCHC: 31.8 g/dL (ref 30.0–36.0)
MCV: 87.2 fL (ref 78.0–100.0)
Monocytes Absolute: 0.5 10*3/uL (ref 0.1–1.0)
Monocytes Relative: 8 % (ref 3–12)
NEUTROS ABS: 4.4 10*3/uL (ref 1.7–7.7)
Neutrophils Relative %: 67 % (ref 43–77)
Platelets: 355 10*3/uL (ref 150–400)
RBC: 4.44 MIL/uL (ref 3.87–5.11)
RDW: 15.1 % (ref 11.5–15.5)
WBC: 6.6 10*3/uL (ref 4.0–10.5)

## 2014-05-07 LAB — COMPREHENSIVE METABOLIC PANEL
ALBUMIN: 3.8 g/dL (ref 3.5–5.2)
ALK PHOS: 26 U/L — AB (ref 39–117)
ALT: 23 U/L (ref 0–35)
AST: 25 U/L (ref 0–37)
BUN: 17 mg/dL (ref 6–23)
CO2: 24 mEq/L (ref 19–32)
Calcium: 9.7 mg/dL (ref 8.4–10.5)
Chloride: 106 mEq/L (ref 96–112)
Creat: 0.94 mg/dL (ref 0.50–1.10)
Glucose, Bld: 91 mg/dL (ref 70–99)
POTASSIUM: 4.3 meq/L (ref 3.5–5.3)
Sodium: 142 mEq/L (ref 135–145)
TOTAL PROTEIN: 6.5 g/dL (ref 6.0–8.3)
Total Bilirubin: 0.3 mg/dL (ref 0.2–1.2)

## 2014-05-08 LAB — HEMOGLOBIN A1C
HEMOGLOBIN A1C: 6.1 % — AB (ref <5.7)
MEAN PLASMA GLUCOSE: 128 mg/dL — AB (ref <117)

## 2014-05-08 LAB — TSH: TSH: 1.465 u[IU]/mL (ref 0.350–4.500)

## 2014-05-08 LAB — VITAMIN D 25 HYDROXY (VIT D DEFICIENCY, FRACTURES): VIT D 25 HYDROXY: 41 ng/mL (ref 30–89)

## 2014-05-09 ENCOUNTER — Ambulatory Visit (INDEPENDENT_AMBULATORY_CARE_PROVIDER_SITE_OTHER): Payer: 59 | Admitting: Internal Medicine

## 2014-05-09 ENCOUNTER — Encounter: Payer: Self-pay | Admitting: Internal Medicine

## 2014-05-09 VITALS — BP 130/76 | HR 80 | Temp 98.1°F | Ht 62.0 in | Wt 154.0 lb

## 2014-05-09 DIAGNOSIS — Z853 Personal history of malignant neoplasm of breast: Secondary | ICD-10-CM

## 2014-05-09 DIAGNOSIS — Z8659 Personal history of other mental and behavioral disorders: Secondary | ICD-10-CM

## 2014-05-09 DIAGNOSIS — E785 Hyperlipidemia, unspecified: Secondary | ICD-10-CM

## 2014-05-09 DIAGNOSIS — I471 Supraventricular tachycardia: Secondary | ICD-10-CM

## 2014-05-09 DIAGNOSIS — Z9889 Other specified postprocedural states: Secondary | ICD-10-CM

## 2014-05-09 DIAGNOSIS — J069 Acute upper respiratory infection, unspecified: Secondary | ICD-10-CM

## 2014-05-09 DIAGNOSIS — G4700 Insomnia, unspecified: Secondary | ICD-10-CM

## 2014-05-09 DIAGNOSIS — D239 Other benign neoplasm of skin, unspecified: Secondary | ICD-10-CM

## 2014-05-09 DIAGNOSIS — Z0000 Encounter for general adult medical examination without abnormal findings: Secondary | ICD-10-CM

## 2014-05-09 DIAGNOSIS — M797 Fibromyalgia: Secondary | ICD-10-CM

## 2014-05-09 DIAGNOSIS — K219 Gastro-esophageal reflux disease without esophagitis: Secondary | ICD-10-CM

## 2014-05-09 DIAGNOSIS — E119 Type 2 diabetes mellitus without complications: Secondary | ICD-10-CM

## 2014-05-09 DIAGNOSIS — Z8739 Personal history of other diseases of the musculoskeletal system and connective tissue: Secondary | ICD-10-CM

## 2014-05-09 LAB — POCT URINALYSIS DIPSTICK
Bilirubin, UA: NEGATIVE
Glucose, UA: NEGATIVE
Ketones, UA: NEGATIVE
Leukocytes, UA: NEGATIVE
NITRITE UA: NEGATIVE
PH UA: 6.5
Protein, UA: NEGATIVE
RBC UA: NEGATIVE
Spec Grav, UA: 1.01
Urobilinogen, UA: NEGATIVE

## 2014-05-09 MED ORDER — ALBUTEROL SULFATE HFA 108 (90 BASE) MCG/ACT IN AERS: 2.0000 | INHALATION_SPRAY | Freq: Four times a day (QID) | RESPIRATORY_TRACT | Status: DC | PRN

## 2014-05-09 MED ORDER — HYDROCOD POLST-CHLORPHEN POLST 10-8 MG/5ML PO LQCR: 5.0000 mL | Freq: Two times a day (BID) | ORAL | Status: DC | PRN

## 2014-05-09 MED ORDER — LEVOFLOXACIN 500 MG PO TABS: 500.0000 mg | ORAL_TABLET | Freq: Every day | ORAL | Status: DC

## 2014-05-13 ENCOUNTER — Ambulatory Visit: Payer: 59 | Admitting: Hematology and Oncology

## 2014-05-13 ENCOUNTER — Other Ambulatory Visit: Payer: Self-pay

## 2014-05-13 ENCOUNTER — Other Ambulatory Visit: Payer: 59

## 2014-05-13 DIAGNOSIS — Z853 Personal history of malignant neoplasm of breast: Secondary | ICD-10-CM

## 2014-05-14 ENCOUNTER — Telehealth: Payer: Self-pay | Admitting: Hematology and Oncology

## 2014-05-14 ENCOUNTER — Other Ambulatory Visit (HOSPITAL_BASED_OUTPATIENT_CLINIC_OR_DEPARTMENT_OTHER): Payer: 59

## 2014-05-14 ENCOUNTER — Ambulatory Visit (HOSPITAL_BASED_OUTPATIENT_CLINIC_OR_DEPARTMENT_OTHER): Payer: 59 | Admitting: Hematology and Oncology

## 2014-05-14 VITALS — BP 130/81 | HR 90 | Temp 98.5°F | Resp 18 | Ht 62.0 in | Wt 151.7 lb

## 2014-05-14 DIAGNOSIS — Z853 Personal history of malignant neoplasm of breast: Secondary | ICD-10-CM

## 2014-05-14 DIAGNOSIS — Z79811 Long term (current) use of aromatase inhibitors: Secondary | ICD-10-CM

## 2014-05-14 LAB — CBC WITH DIFFERENTIAL/PLATELET
BASO%: 1.1 % (ref 0.0–2.0)
BASOS ABS: 0.1 10*3/uL (ref 0.0–0.1)
EOS ABS: 0.3 10*3/uL (ref 0.0–0.5)
EOS%: 4.5 % (ref 0.0–7.0)
HCT: 39.8 % (ref 34.8–46.6)
HEMOGLOBIN: 12.5 g/dL (ref 11.6–15.9)
LYMPH%: 23.1 % (ref 14.0–49.7)
MCH: 27.6 pg (ref 25.1–34.0)
MCHC: 31.4 g/dL — ABNORMAL LOW (ref 31.5–36.0)
MCV: 88 fL (ref 79.5–101.0)
MONO#: 0.4 10*3/uL (ref 0.1–0.9)
MONO%: 5.4 % (ref 0.0–14.0)
NEUT%: 65.9 % (ref 38.4–76.8)
NEUTROS ABS: 4.7 10*3/uL (ref 1.5–6.5)
Platelets: 359 10*3/uL (ref 145–400)
RBC: 4.52 10*6/uL (ref 3.70–5.45)
RDW: 14.8 % — ABNORMAL HIGH (ref 11.2–14.5)
WBC: 7.1 10*3/uL (ref 3.9–10.3)
lymph#: 1.6 10*3/uL (ref 0.9–3.3)

## 2014-05-14 LAB — COMPREHENSIVE METABOLIC PANEL (CC13)
ALBUMIN: 3.4 g/dL — AB (ref 3.5–5.0)
ALT: 28 U/L (ref 0–55)
ANION GAP: 9 meq/L (ref 3–11)
AST: 23 U/L (ref 5–34)
Alkaline Phosphatase: 28 U/L — ABNORMAL LOW (ref 40–150)
BUN: 23.3 mg/dL (ref 7.0–26.0)
CO2: 26 meq/L (ref 22–29)
Calcium: 9.9 mg/dL (ref 8.4–10.4)
Chloride: 109 mEq/L (ref 98–109)
Creatinine: 1 mg/dL (ref 0.6–1.1)
GLUCOSE: 147 mg/dL — AB (ref 70–140)
Potassium: 4.3 mEq/L (ref 3.5–5.1)
SODIUM: 144 meq/L (ref 136–145)
TOTAL PROTEIN: 6.8 g/dL (ref 6.4–8.3)
Total Bilirubin: 0.34 mg/dL (ref 0.20–1.20)

## 2014-05-14 NOTE — Progress Notes (Signed)
Patient ID: Jill Richardson, female   DOB: 10-08-1955, 58 y.o.   MRN: [id number] Reviewed: Agree with the documentation and management of our [place].

## 2014-05-14 NOTE — Assessment & Plan Note (Signed)
Left breast invasive mammary cancer diagnosed 06/16/2006 status post lumpectomy 07/15/2006 IDC, 1.4 cm, grade 2, 2 SLN negative, yet 100%, PR percent, Ki-67 25%, HER-2 negative status post radiation therapy and 2 years of tamoxifen followed by Femara  Aromatase inhibitor counseling: patient has remained on Femara and is interested to know how long she needs to continue with this. She currently has hot flashes. apart from this she is not having too much trouble.I would like to obtain a bone density test for further evaluation. Her last bone density was in 2013 December. Currently takes Effexor for hot flashes. I would like to send the cancer cells for breast cancer index to determine if she needs any longer term aromatase inhibitor treatment.  Return to clinic in 6 months. I will call her and discuss the results of breast cancer index disease she needs to continue with Femara.

## 2014-05-14 NOTE — Progress Notes (Signed)
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   PRIOR THERAPY:  1. Patient underwent screening mammogram on 06/13/2006 that detected a mass in the left breast. A diagnostic mammogram on 06/24/2006 showed a 1.2 cm mass in the left breast at 5 o'clock and a biopsy showed invasive mammary carcinoma. MRI on 07/03/2006 a 1.6 cm lesion in the left breast.   2. Patient underwent lumpectomy on 07/15/2006 by Dr. Margot Chimes. He removed a 1.4cm grade 2 lesion of invasive ductal carcinoma. ER 100%, PR 100%, HER-2/neu negative with a Ki-67 of 25%. 2 sentinel nodes were biopsied and negative. A bone scan was done in February, 2008 and negative.   3. Radiation therapy from August 30, 2006 through Oct 27, 2006.   4. Patient was placed on Tamoxifen x 2 years in May 2008. She then was changed to Femara. She has now completed 6 years of Femara. She certainly can come off the therapy.( 07/25/2013)  CURRENT THERAPY: Femara 2.5 mg daily CHIEF COMPLIANT: followup on Femara for breast cancer  INTERVAL HISTORY: AMEA MCPHAIL is a 58 year old Caucasian with above-mentioned history of left breast cancer treated with lumpectomy and radiation and is currently on antiestrogen therapy with Femara. She is tolerating it fairly well except for hot flashes. She denies any major problems with it other than the hot flashes that she takes Effexor for it and that relieves symptoms. She is currently working as a Building services engineer. She is also working at [employer]. As a Development worker, international aid. Denies any lumps or nodules in the breast.  REVIEW OF SYSTEMS:   Constitutional: Denies fevers, chills  or abnormal weight loss Eyes: Denies blurriness of vision Ears, nose, mouth, throat, and face: Denies mucositis or sore throat Respiratory: Denies cough, dyspnea or wheezes Cardiovascular: Denies palpitation, chest discomfort or lower extremity swelling Gastrointestinal:  Denies nausea, heartburn or change in bowel habits Skin: Denies abnormal skin rashes Lymphatics: Denies new lymphadenopathy or easy bruising Neurological:Denies numbness, tingling or new weaknesses Behavioral/Psych: Mood is stable, no new changes  Breast:  denies any pain or lumps or nodules in either breasts All other systems were reviewed with the patient and are negative.  I have reviewed the past medical history, past surgical history, social history and family history with the patient and they are unchanged from previous note.  ALLERGIES:  is allergic to lipitor and lodine.  MEDICATIONS:  Current Outpatient Prescriptions  Medication Sig Dispense Refill  . albuterol (PROVENTIL HFA;VENTOLIN HFA) 108 (90 BASE) MCG/ACT inhaler Inhale 2 puffs into the lungs every 6 (six) hours as needed for wheezing or shortness of breath. 1 Inhaler 1  . calcium-vitamin D (OSCAL WITH D) 500-200 MG-UNIT per tablet Take 1 tablet by mouth 2 (two) times daily.    . chlorpheniramine-HYDROcodone (TUSSIONEX PENNKINETIC ER) 10-8 MG/5ML LQCR Take 5 mLs by mouth every 12 (twelve) hours as needed for cough. 4 mL 0  . digoxin (LANOXIN) 0.25 MG tablet Take 0.25 mg by mouth daily.    . fenofibrate 160 MG tablet Take 160 mg by mouth daily.    Marland Kitchen glucosamine-chondroitin 500-400 MG tablet Take 1 tablet by mouth daily.    Marland Kitchen HYDROcodone-acetaminophen (NORCO) 10-325 MG  per tablet Take 1 tablet by mouth every 8 (eight) hours as needed for pain. 90 tablet 0  . JANUVIA 100 MG tablet TAKE 1 TABLET (100 MG TOTAL) BY MOUTH DAILY. 90 tablet 3  . letrozole (FEMARA) 2.5 MG tablet TAKE 1 TABLET (2.5 MG TOTAL) BY MOUTH DAILY. 90 tablet PRN  . levofloxacin (LEVAQUIN)  500 MG tablet Take 1 tablet (500 mg total) by mouth daily. 7 tablet 0  . LORazepam (ATIVAN) 2 MG tablet TAKE 1 TABLET BY MOUTH AT BEDTIME AS NEEDED 90 tablet 1  . metFORMIN (GLUCOPHAGE) 500 MG tablet Take 500 mg by mouth 2 (two) times daily with a meal.    . Multiple Vitamin (MULTIVITAMIN) capsule Take 1 capsule by mouth daily.      Marland Kitchen oxaprozin (DAYPRO) 600 MG tablet Take 1,200 mg by mouth at bedtime.    . pantoprazole (PROTONIX) 40 MG tablet Take 40 mg by mouth daily.    . ramipril (ALTACE) 2.5 MG capsule Take 1 capsule (2.5 mg total) by mouth daily. 90 capsule 3  . rosuvastatin (CRESTOR) 10 MG tablet Take 1 tablet (10 mg total) by mouth daily. 90 tablet 1  . traMADol (ULTRAM) 50 MG tablet Take 1 tablet (50 mg total) by mouth every 8 (eight) hours as needed for pain. 270 tablet 1  . venlafaxine XR (EFFEXOR-XR) 37.5 MG 24 hr capsule Take 1 capsule (37.5 mg total) by mouth daily. 90 capsule 6   No current facility-administered medications for this visit.    PHYSICAL EXAMINATION: ECOG PERFORMANCE STATUS: 0 - Asymptomatic  Filed Vitals:   05/14/14 0854  BP: 130/81  Pulse: 90  Temp: 98.5 F (36.9 C)  Resp: 18   Filed Weights   05/14/14 0854  Weight: 151 lb 11.2 oz (68.811 kg)    GENERAL:alert, no distress and comfortable SKIN: skin color, texture, turgor are normal, no rashes or significant lesions EYES: normal, Conjunctiva are pink and non-injected, sclera clear OROPHARYNX:no exudate, no erythema and lips, buccal mucosa, and tongue normal  NECK: supple, thyroid normal size, non-tender, without nodularity LYMPH:  no palpable lymphadenopathy in the cervical, axillary or inguinal LUNGS: clear to auscultation and percussion with normal breathing effort HEART: regular rate & rhythm and no murmurs and no lower extremity edema ABDOMEN:abdomen soft, non-tender and normal bowel sounds Musculoskeletal:no cyanosis of digits and no clubbing  NEURO: alert & oriented x 3 with fluent speech, no  focal motor/sensory deficits BREAST: No palpable masses or nodules in either right or left breasts. No palpable axillary supraclavicular or infraclavicular adenopathy no breast tenderness or nipple discharge.   LABORATORY DATA:  I have reviewed the data as listed   Chemistry      Component Value Date/Time   NA 144 05/14/2014 0822   NA 142 05/07/2014 0927   K 4.3 05/14/2014 0822   K 4.3 05/07/2014 0927   CL 106 05/07/2014 0927   CO2 26 05/14/2014 0822   CO2 24 05/07/2014 0927   BUN 23.3 05/14/2014 0822   BUN 17 05/07/2014 0927   CREATININE 1.0 05/14/2014 0822   CREATININE 0.94 05/07/2014 0927   CREATININE 1.00 12/12/2013 1930      Component Value Date/Time   CALCIUM 9.9 05/14/2014 0822   CALCIUM 9.7 05/07/2014 0927   ALKPHOS 28* 05/14/2014 0822   ALKPHOS 26* 05/07/2014 0927   AST 23 05/14/2014 0822   AST 25 05/07/2014 0927   ALT 28 05/14/2014 0822   ALT 23 05/07/2014 0927   BILITOT 0.34 05/14/2014 6546  BILITOT 0.3 05/07/2014 4098       Lab Results  Component Value Date   WBC 7.1 05/14/2014   HGB 12.5 05/14/2014   HCT 39.8 05/14/2014   MCV 88.0 05/14/2014   PLT 359 05/14/2014   NEUTROABS 4.7 05/14/2014     RADIOGRAPHIC STUDIES: Mammograms to be scheduled for next week   ASSESSMENT & PLAN:  History of breast cancer, (IDC) left LIQ, receptor +, her 2 - Left breast invasive mammary cancer diagnosed 06/16/2006 status post lumpectomy 07/15/2006 IDC, 1.4 cm, grade 2, 2 SLN negative, yet 100%, PR percent, Ki-67 25%, HER-2 negative status post radiation therapy and 2 years of tamoxifen followed by Femara  Aromatase inhibitor counseling: patient has remained on Femara and is interested to know how long she needs to continue with this. She currently has hot flashes. apart from this she is not having too much trouble.I would like to obtain a bone density test for further evaluation. Her last bone density was in 2013 December. Currently takes Effexor for hot flashes. I  would like to send the cancer cells for breast cancer index to determine if she needs any longer term aromatase inhibitor treatment.  Return to clinic in 6 months. I will call her and discuss the results of breast cancer index disease she needs to continue with Femara.   No orders of the defined types were placed in this encounter.   The patient has a good understanding of the overall plan. she agrees with it. She will call with any problems that may develop before her next visit here.  I spent 20 minutes counseling the patient face to face. The total time spent in the appointment was 25 minutes and more than 50% was on counseling and review of test results    Rulon Eisenmenger, MD 05/14/2014 9:38 AM

## 2014-05-14 NOTE — Telephone Encounter (Signed)
, °

## 2014-05-16 ENCOUNTER — Encounter: Payer: Self-pay | Admitting: *Deleted

## 2014-05-16 NOTE — Progress Notes (Signed)
Ordered BCI per Dr. Lindi Adie ordered.  Faxed requisition to [facility].

## 2014-05-27 ENCOUNTER — Ambulatory Visit
Admission: RE | Admit: 2014-05-27 | Discharge: 2014-05-27 | Disposition: A | Discharge status: Home / Self Care | Payer: 59 | Source: Ambulatory Visit

## 2014-05-27 DIAGNOSIS — Z1231 Encounter for screening mammogram for malignant neoplasm of breast: Secondary | ICD-10-CM

## 2014-05-30 ENCOUNTER — Encounter (HOSPITAL_COMMUNITY): Payer: Self-pay

## 2014-06-10 ENCOUNTER — Other Ambulatory Visit: Payer: Self-pay | Admitting: Internal Medicine

## 2014-07-08 ENCOUNTER — Encounter: Payer: Self-pay | Admitting: Internal Medicine

## 2014-07-08 ENCOUNTER — Ambulatory Visit (INDEPENDENT_AMBULATORY_CARE_PROVIDER_SITE_OTHER): Payer: 59 | Admitting: Internal Medicine

## 2014-07-08 VITALS — BP 136/84 | HR 74 | Temp 97.6°F | Wt 150.0 lb

## 2014-07-08 DIAGNOSIS — J029 Acute pharyngitis, unspecified: Secondary | ICD-10-CM

## 2014-07-08 DIAGNOSIS — J040 Acute laryngitis: Secondary | ICD-10-CM

## 2014-07-08 DIAGNOSIS — J069 Acute upper respiratory infection, unspecified: Secondary | ICD-10-CM

## 2014-07-08 MED ORDER — HYDROCOD POLST-CHLORPHEN POLST 10-8 MG/5ML PO LQCR: 5.0000 mL | Freq: Two times a day (BID) | ORAL | Status: DC | PRN

## 2014-07-08 MED ORDER — FLURAZEPAM HCL 30 MG PO CAPS: 30.0000 mg | ORAL_CAPSULE | Freq: Every evening | ORAL | Status: DC | PRN

## 2014-07-08 MED ORDER — LEVOFLOXACIN 500 MG PO TABS: 500.0000 mg | ORAL_TABLET | Freq: Every day | ORAL | Status: DC

## 2014-07-08 NOTE — Patient Instructions (Signed)
Take Levaquin 500 mg daily x 10 days. Take Tussionex as directed.

## 2014-07-25 ENCOUNTER — Other Ambulatory Visit: Payer: Self-pay | Admitting: Internal Medicine

## 2014-07-25 ENCOUNTER — Ambulatory Visit (INDEPENDENT_AMBULATORY_CARE_PROVIDER_SITE_OTHER): Payer: Self-pay | Admitting: Family Medicine

## 2014-07-25 VITALS — BP 122/78 | Wt 147.0 lb

## 2014-07-25 DIAGNOSIS — E119 Type 2 diabetes mellitus without complications: Secondary | ICD-10-CM

## 2014-07-27 NOTE — Progress Notes (Signed)
   Subjective:    Patient ID: Jill Richardson, female    DOB: 03/27/1956, 59 y.o.   MRN: [id number]  HPI Patient is been around grandchildren who are sick and has been working at the hospital as well. His come down with sore throat and laryngitis and an upper respiratory infection. Has been coughing a great deal congested. She had flu vaccine at work. No shaking chills. Has malaise and fatigue. 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    Review of Systems     Objective:   Physical Exam Skin warm and dry. Nodes none. Patient is hoarse. TMs are full. Pharynx slightly injected. Neck is supple. Chest clear to auscultation. Rapid strep screen is negative.       Assessment & Plan:  Acute URI  Laryngitis  Pharyngitis  Plan: Levaquin 500 milligrams daily for 10 days. Tussionex 1 teaspoon by mouth every 12 hours when necessary cough.

## 2014-07-31 ENCOUNTER — Other Ambulatory Visit: Payer: Self-pay | Admitting: Obstetrics and Gynecology

## 2014-08-01 LAB — CYTOLOGY - PAP

## 2014-08-01 NOTE — Progress Notes (Signed)
Patient presents today for 3 month diabetes follow-up as part of the employer-sponsored Link to Wellness program. Current diabetes regimen includes Metformin and Januvia. Patient also continues on daily ACEi and statin. Most recent MD follow-up was Nov 2015. Patient has a pending appt for 6 month follow-up. A1c at most recent apt was 6.1. No major health changes. Of note, pt has graduated from letrozole therapy and was able to discontinue about 2 months ago.   Diabetes Assessment: Type of Diabetes: Type 2; Year of diagnosis 2013; MD managing Diabetes Dr. Renold Genta; Current Diabetes related medical conditions are High cholesterol; checks feet daily; uses glucometer; takes medications as prescribed; does not take an aspirin a day; checks blood glucose 2-6 times a week; What is target A1c? %; hypoglycemia frequency 0; 7 day CBG average Did not bring meter to appointment; Highest CBG 140; Lowest CBG 75; Sees Diabetes provider 2 times per year Other Diabetes History: Current med regimen includes Metformin 500 mg twice daily, and Januvia 100 mg daily. Patient does maintain good medication compliance. Patient did not bring meter today but is currently testing 2-3 times per week. Glucose monitoring occurs fasting, occassionally after meals, before bed, and if symptomatic. Per patient report, fasting glucose is generally 76-102, and before bed readings are generally 120 or less. Hypoglycemia frequency is rare. Patient does demonstrate appropriate correction of hypoglycemia, and carries glucose tabs at all times. Patient denies signs and symptoms of neuropathy including numbness/tingling/burning and symptoms of foot infection. Patient is not due for yearly eye exam, and had annual exam last July 2015. Patient's A1c had decreased from 6.5 to 6.1 at most recent MD appt.   Lifestyle Factors: Diet - No major changes at this time. Patient aware of need to make healthier choices. Patient eats out couple of times per week at  restaurants, but attempts to make healthy choices. Patient reports a fairly healthy diet, but continues to work toward limiting carbs and portion sizes.  Exercise: Very little routine exercise at this time. Patient does enjoy gardening but is doing that less now after season change. She continues walking around property on occasion and keeps her two grandchildren twice per week. 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 I have encouraged her to add a short walk during the day. Stays active with grandchildren.   Plan: 1) Continue making healthy dietary choices, and limit portion sizes and starchy carbohydrates 2) Continue to stay as active as possible, consider adding an additional walk per week 3) Continue to stay compliant with medications 4) Continue testing at least three times per week 5) Follow-up in 3 months on Thursday May 5th @ 4:00 pm

## 2014-08-06 NOTE — Progress Notes (Signed)
Patient ID: Jill Richardson, female   DOB: 1955/10/18, 58 y.o.   MRN: [id number] Reviewed: Agree with documentation and management of our [facility] pharmacologist.

## 2014-08-24 NOTE — Patient Instructions (Addendum)
Continue same medications and return in 6 months. Take Levaquin 500 mg daily for 7 days. Use albuterol inhaler 4 times daily if needed

## 2014-08-24 NOTE — Progress Notes (Signed)
Subjective:    Patient ID: Jill Richardson, female    DOB: [birth year], 59 y.o.   MRN: [id number]  HPI 59 year old White Female in today for health maintenance exam and evaluation of medical issues. She has a history of low back pain, had lumbar fusion L5-S1 in 2005 using a Gill procedure with cages and pedicle screws by Dr. Carloyn Manner. She has a history of breast cancer, controlled type 2 diabetes, hyperlipidemia, GE reflux, fibromyalgia and remote history of supraventricular tachycardia.  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 Able to tolerate Daypro for musculoskeletal pain.  Had left oophorectomy March 1997. Had an exploratory laparotomy for small bowel obstruction one month later.  Colonoscopy done in 2008.  History of dysplastic nevus of the shoulder 2008.  Long-standing history of fibromyalgia syndrome dating back to the 1990s.  Had invasive ductal carcinoma left breast diagnosed in January 2008. She's had 2 pregnancies, no miscarriages. Menarche at age 49. Menopausal around age 34. She took hormone replacement therapy until her cancer diagnosis. Tumor was ER/PR positive with evidence of lymphovascular invasion. Nodes were negative. She had radiation and lumpectomy. Radiation was completed April 2008. She was treated with tamoxifen and Femara.  She takes anti-inflammatory medication for fibromyalgia. Takes Crestor for hyperlipidemia. Has Flexeril for occasional back pain. Takes Ativan for insomnia and occasional tramadol for back pain. Occasional Norco for back pain. History of GE reflux treated with PPI. Is on metformin and Januvia for diabetes mellitus.  Social history: She is married. Husband is disabled due to chronic brain injury after an accident involving a tree falling on him in the mid 1990s. 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  2 grandchildren. 2 adult sons.  Family history: Father with history of diabetes and hypertension died from complications of heart failure. Brother with history of hypertension. Mother in good health. Sister in good health.  She has an acute upper rest were infection today    Review of Systems     Objective:   Physical Exam  Constitutional: She is oriented to person, place, and time. She appears well-developed and well-nourished. No distress.  HENT:  Head: Normocephalic and atraumatic.  Right Ear: External ear normal.  Left Ear: External ear normal.  Mouth/Throat: Oropharynx is clear and moist. No oropharyngeal exudate.  Eyes: Conjunctivae and EOM are normal. Pupils are equal, round, and reactive to light. Right eye exhibits no discharge. Left eye exhibits no discharge. No scleral icterus.  Neck: Neck supple. No JVD present.  Cardiovascular: Normal rate, regular rhythm, normal heart sounds and intact distal pulses.   No murmur heard. Pulmonary/Chest: Effort normal and breath sounds normal. No respiratory distress. She has no wheezes. She has no rales. She exhibits no tenderness.  Breasts: No masses  Abdominal: Soft. Bowel sounds are normal. She exhibits no distension and no mass. There is no tenderness. There is no rebound and no guarding.  Genitourinary:  Deferred to GYN Dr. Vanessa Kick. Had Pap smear 2013  Musculoskeletal: Normal range of motion. She exhibits no edema.  Lymphadenopathy:    She has no cervical adenopathy.  Neurological: She is alert and oriented to person, place, and time. She has normal reflexes. No cranial nerve deficit. Coordination normal.  Skin: Skin is warm and dry. No rash noted.  Psychiatric: She has a normal mood and affect. Her behavior  is normal. Judgment and thought content normal.  Vitals reviewed.         Assessment & Plan:  Fibromyalgia syndrome  History of back pain with history of L5-S1 fusion 2005  Controlled type 2 diabetes  mellitus-treated with metformin and Januvia  History of breast cancer-invasive ductal carcinoma  Hyperlipidemia treated with statin and TriCor  History of supraventricular tachycardia stable with Lanoxin  History of depression  History of insomnia  History of dysplastic nevus 2008  Acute URI-new problem today  Plan: Continue current medications and return in 6 months. Levaquin 500 milligrams daily for 7 days. Albuterol inhaler 2 sprays 4 times daily as needed.

## 2014-10-31 ENCOUNTER — Ambulatory Visit: Payer: Self-pay | Admitting: Pharmacist

## 2014-11-01 ENCOUNTER — Telehealth: Payer: Self-pay | Admitting: Hematology and Oncology

## 2014-11-01 NOTE — Telephone Encounter (Signed)
Patient called in to reschedule her appiontment

## 2014-11-04 ENCOUNTER — Other Ambulatory Visit: Payer: 59 | Admitting: Internal Medicine

## 2014-11-04 ENCOUNTER — Ambulatory Visit: Payer: 59 | Admitting: Hematology and Oncology

## 2014-11-05 ENCOUNTER — Ambulatory Visit: Payer: 59 | Admitting: Internal Medicine

## 2014-11-15 ENCOUNTER — Other Ambulatory Visit: Payer: Self-pay | Admitting: Oncology

## 2014-11-15 NOTE — Telephone Encounter (Signed)
Last OV 05/14/14.  Next OV 11/21/14.  Chart reviewed

## 2014-11-19 ENCOUNTER — Other Ambulatory Visit: Payer: 59 | Admitting: Internal Medicine

## 2014-11-19 DIAGNOSIS — E119 Type 2 diabetes mellitus without complications: Secondary | ICD-10-CM

## 2014-11-19 DIAGNOSIS — E785 Hyperlipidemia, unspecified: Secondary | ICD-10-CM

## 2014-11-19 DIAGNOSIS — Z79899 Other long term (current) drug therapy: Secondary | ICD-10-CM

## 2014-11-19 LAB — HEPATIC FUNCTION PANEL
ALK PHOS: 24 U/L — AB (ref 39–117)
ALT: 22 U/L (ref 0–35)
AST: 23 U/L (ref 0–37)
Albumin: 3.6 g/dL (ref 3.5–5.2)
BILIRUBIN TOTAL: 0.4 mg/dL (ref 0.2–1.2)
Bilirubin, Direct: 0.1 mg/dL (ref 0.0–0.3)
Indirect Bilirubin: 0.3 mg/dL (ref 0.2–1.2)
TOTAL PROTEIN: 6.3 g/dL (ref 6.0–8.3)

## 2014-11-19 LAB — LIPID PANEL
CHOLESTEROL: 135 mg/dL (ref 0–200)
HDL: 50 mg/dL (ref >=46)
LDL CALC: 61 mg/dL (ref 0–99)
Total CHOL/HDL Ratio: 2.7 Ratio
Triglycerides: 120 mg/dL (ref <150)
VLDL: 24 mg/dL (ref 0–40)

## 2014-11-19 LAB — HEMOGLOBIN A1C
Hgb A1c MFr Bld: 5.9 % — ABNORMAL HIGH (ref <5.7)
Mean Plasma Glucose: 123 mg/dL — ABNORMAL HIGH (ref <117)

## 2014-11-21 ENCOUNTER — Encounter: Payer: Self-pay | Admitting: Internal Medicine

## 2014-11-21 ENCOUNTER — Telehealth: Payer: Self-pay | Admitting: Hematology and Oncology

## 2014-11-21 ENCOUNTER — Ambulatory Visit (HOSPITAL_BASED_OUTPATIENT_CLINIC_OR_DEPARTMENT_OTHER): Payer: 59 | Admitting: Hematology and Oncology

## 2014-11-21 ENCOUNTER — Ambulatory Visit (INDEPENDENT_AMBULATORY_CARE_PROVIDER_SITE_OTHER): Payer: 59 | Admitting: Internal Medicine

## 2014-11-21 VITALS — BP 128/74 | HR 75 | Temp 98.4°F | Resp 18 | Ht 62.0 in | Wt 149.7 lb

## 2014-11-21 VITALS — BP 126/70 | HR 78 | Temp 97.9°F | Ht 62.0 in | Wt 146.5 lb

## 2014-11-21 DIAGNOSIS — Z853 Personal history of malignant neoplasm of breast: Secondary | ICD-10-CM | POA: Diagnosis not present

## 2014-11-21 DIAGNOSIS — E119 Type 2 diabetes mellitus without complications: Secondary | ICD-10-CM

## 2014-11-21 DIAGNOSIS — C50912 Malignant neoplasm of unspecified site of left female breast: Secondary | ICD-10-CM

## 2014-11-21 MED ORDER — TRAMADOL HCL 50 MG PO TABS: 50.0000 mg | ORAL_TABLET | Freq: Three times a day (TID) | ORAL | Status: DC | PRN

## 2014-11-21 MED ORDER — CYCLOBENZAPRINE HCL 10 MG PO TABS: ORAL_TABLET | ORAL | Status: DC

## 2014-11-21 NOTE — Telephone Encounter (Signed)
Gave avs & calendar for May 2017 °

## 2014-11-21 NOTE — Progress Notes (Signed)
Patient Care Team: Elby Showers, MD as PCP - General (Internal Medicine) Harle Battiest, MD (Obstetrics and Gynecology) Eston Esters, MD (Hematology and Oncology) Tyler Pita, MD (Radiation Oncology) Neldon Mc, MD (General Surgery)  DIAGNOSIS: No matching staging information was found for the patient.  SUMMARY OF ONCOLOGIC HISTORY:   Breast cancer, left breast   07/05/2006 Surgery Left breast lumpectomy: 1.4cm grade 2 lesion of invasive ductal carcinoma. ER 100%, PR 100%, HER-2/neu negative Ki-67 of 25%. 0/2 SLN   08/30/2006 - 10/27/2006 Radiation Therapy Adjuvant radiation therapy   11/21/2006 - 07/25/2013 Anti-estrogen oral therapy Tamoxifen 2 years then changed to Femara 6 years    CHIEF COMPLIANT: Follow-up of breast cancer  INTERVAL HISTORY: Jill Richardson is a 59 year old with above-mentioned history of left breast cancer who was taking antiestrogen therapy. Breast cancer index was done and she was found to have low likelihood of benefit from antiestrogen therapy. So we discontinued her Femara. Unfortunately BCI was rejected by her insurance and she is very concerned about it. She had tried talking to her insurance but they have refused it.  REVIEW OF SYSTEMS:   Constitutional: Denies fevers, chills or abnormal weight loss Eyes: Denies blurriness of vision Ears, nose, mouth, throat, and face: Denies mucositis or sore throat Respiratory: Denies cough, dyspnea or wheezes Cardiovascular: Denies palpitation, chest discomfort or lower extremity swelling Gastrointestinal:  Denies nausea, heartburn or change in bowel habits Skin: Denies abnormal skin rashes Lymphatics: Denies new lymphadenopathy or easy bruising Neurological:Denies numbness, tingling or new weaknesses Behavioral/Psych: Mood is stable, no new changes  Breast:  denies any pain or lumps or nodules in either breasts All other systems were reviewed with the patient and are negative.  I have reviewed the past medical  history, past surgical history, social history and family history with the patient and they are unchanged from previous note.  ALLERGIES:  is allergic to lipitor and lodine.  MEDICATIONS:  Current Outpatient Prescriptions  Medication Sig Dispense Refill  . calcium-vitamin D (OSCAL WITH D) 500-200 MG-UNIT per tablet Take 1 tablet by mouth 2 (two) times daily.    . cyclobenzaprine (FLEXERIL) 10 MG tablet One po qhs 90 tablet 1  . digoxin (LANOXIN) 0.25 MG tablet Take 0.25 mg by mouth daily.    . fenofibrate 160 MG tablet Take 160 mg by mouth daily.    Marland Kitchen glucosamine-chondroitin 500-400 MG tablet Take 1 tablet by mouth daily.    Marland Kitchen JANUVIA 100 MG tablet TAKE 1 TABLET (100 MG TOTAL) BY MOUTH DAILY. 90 tablet 3  . LORazepam (ATIVAN) 2 MG tablet TAKE 1 TABLET BY MOUTH AT BEDTIME AS NEEDED 90 tablet 1  . metFORMIN (GLUCOPHAGE) 500 MG tablet TAKE 1 TABLET BY MOUTH TWICE DAILY 180 tablet 3  . Multiple Vitamin (MULTIVITAMIN) capsule Take 1 capsule by mouth daily.      Marland Kitchen oxaprozin (DAYPRO) 600 MG tablet Take 1,200 mg by mouth at bedtime.    . pantoprazole (PROTONIX) 40 MG tablet Take 40 mg by mouth daily.    . ramipril (ALTACE) 2.5 MG capsule Take 1 capsule (2.5 mg total) by mouth daily. 90 capsule 3  . traMADol (ULTRAM) 50 MG tablet Take 1 tablet (50 mg total) by mouth every 8 (eight) hours as needed. 270 tablet 0  . venlafaxine XR (EFFEXOR-XR) 37.5 MG 24 hr capsule TAKE 1 CAPSULE BY MOUTH DAILY. 90 capsule 3   No current facility-administered medications for this visit.    PHYSICAL EXAMINATION: ECOG PERFORMANCE STATUS: 0 -  Asymptomatic  Filed Vitals:   11/21/14 1422  BP: 128/74  Pulse: 75  Temp: 98.4 F (36.9 C)  Resp: 18   Filed Weights   11/21/14 1422  Weight: 149 lb 11.2 oz (67.903 kg)    GENERAL:alert, no distress and comfortable SKIN: skin color, texture, turgor are normal, no rashes or significant lesions EYES: normal, Conjunctiva are pink and non-injected, sclera  clear OROPHARYNX:no exudate, no erythema and lips, buccal mucosa, and tongue normal  NECK: supple, thyroid normal size, non-tender, without nodularity LYMPH:  no palpable lymphadenopathy in the cervical, axillary or inguinal LUNGS: clear to auscultation and percussion with normal breathing effort HEART: regular rate & rhythm and no murmurs and no lower extremity edema ABDOMEN:abdomen soft, non-tender and normal bowel sounds Musculoskeletal:no cyanosis of digits and no clubbing  NEURO: alert & oriented x 3 with fluent speech, no focal motor/sensory deficits BREAST: No palpable masses or nodules in either right or left breasts. No palpable axillary supraclavicular or infraclavicular adenopathy no breast tenderness or nipple discharge. (exam performed in the presence of a chaperone)  LABORATORY DATA:  I have reviewed the data as listed   Chemistry      Component Value Date/Time   NA 144 05/14/2014 0822   NA 142 05/07/2014 0927   K 4.3 05/14/2014 0822   K 4.3 05/07/2014 0927   CL 106 05/07/2014 0927   CO2 26 05/14/2014 0822   CO2 24 05/07/2014 0927   BUN 23.3 05/14/2014 0822   BUN 17 05/07/2014 0927   CREATININE 1.0 05/14/2014 0822   CREATININE 0.94 05/07/2014 0927   CREATININE 1.00 12/12/2013 1930      Component Value Date/Time   CALCIUM 9.9 05/14/2014 0822   CALCIUM 9.7 05/07/2014 0927   ALKPHOS 24* 11/19/2014 0926   ALKPHOS 28* 05/14/2014 0822   AST 23 11/19/2014 0926   AST 23 05/14/2014 0822   ALT 22 11/19/2014 0926   ALT 28 05/14/2014 0822   BILITOT 0.4 11/19/2014 0926   BILITOT 0.34 05/14/2014 0822       Lab Results  Component Value Date   WBC 7.1 05/14/2014   HGB 12.5 05/14/2014   HCT 39.8 05/14/2014   MCV 88.0 05/14/2014   PLT 359 05/14/2014   NEUTROABS 4.7 05/14/2014   ASSESSMENT & PLAN:  Breast cancer, left breast Left breast invasive mammary cancer diagnosed 06/16/2006 status post lumpectomy 07/15/2006 IDC, 1.4 cm, grade 2, 2 SLN negative, yet 100%, PR  percent, Ki-67 25%, HER-2 negative status post radiation therapy and 2 years of tamoxifen followed by Femara discontinued April 2016.  Breast cancer index result: Low risk and low benefit to extended adjuvant therapy Hot flashes: Patient tried to discontinue Effexor but her father flashes returned and she is continuing on Effexor for now. I agreed with her primary care physician Dr. Renold Genta that she should continue to get bone density test every other year because she is postmenopausal. Her last bone density done in 2013 showed a T score of -1.2.  Breast Cancer Surveillance: 1. Breast exam 11/21/2014: Normal 2. Mammogram 05/27/2014 No abnormalities. Postsurgical changes. Breast Density Category B. I recommended that she get 3-D mammograms for surveillance. Discussed the differences between different breast density categories.   Return to clinic in 1 year for follow-up  No orders of the defined types were placed in this encounter.   The patient has a good understanding of the overall plan. she agrees with it. she will call with any problems that may develop before the next  visit here.   Rulon Eisenmenger, MD

## 2014-11-21 NOTE — Patient Instructions (Addendum)
To have bone density study. Take tramadol and Flexeril for back pain. Will need physical therapy before can order MRI. Let us know if back pain does not improve. Return in 6 months. Have diabetic eye exam.

## 2014-11-21 NOTE — Assessment & Plan Note (Signed)
Left breast invasive mammary cancer diagnosed 06/16/2006 status post lumpectomy 07/15/2006 IDC, 1.4 cm, grade 2, 2 SLN negative, yet 100%, PR percent, Ki-67 25%, HER-2 negative status post radiation therapy and 2 years of tamoxifen followed by Femara.  Breast cancer index result:  Breast Cancer Surveillance: 1. Breast exam 11/21/2014: Normal 2. Mammogram 05/27/2014 No abnormalities. Postsurgical changes. Breast Density Category B. I recommended that she get 3-D mammograms for surveillance. Discussed the differences between different breast density categories.    Return to clinic in 1 year for follow-up

## 2014-11-21 NOTE — Progress Notes (Signed)
   Subjective:    Patient ID: Jill Richardson, female    DOB: 1956-01-15, 59 y.o.   MRN: [id number]  HPI Six-month recheck on diabetes, hyperlipidemia, anxiety depression, insomnia, history of breast cancer. Lipid panel and liver functions are normal. Hemoglobin A1c is excellent.  Has appointment next month with optometrist at [facility] for diabetic eye exam.  Having issues with recurrent low back pain. Wants Flexeril to take at bedtime. Needs refill on tramadol. Suggested physical therapy but she wants to hold off for nail. Explained to her we likely cannot get MRI without 3 weeks of physical therapy. She is seen Dr. Verita Schneiders in the past 2 told her that L4-L5 were deteriorating. 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 Has applied for another job but hasn't heard back from Ocean[employer]. Has new oncologist that she'll be seeing soon.  Has not had recent bone density study. Order placed today.    Review of Systems low back pain. History of mild osteopenia on last bone density study     Objective:   Physical Exam Straight leg raising is negative at 90. Muscle strength is normal in the lower extremities. Chest clear. Cardiac exam regular rate and rhythm normal S1 and S2. Diabetic foot exam is negative. No thyromegaly.       Assessment & Plan:  Back pain-see above treat with Flexeril and tramadol  Type 2 diabetes mellitus-controlled with medication  Hyperlipidemia-stable on statin  Anxiety depression  Insomnia  Mild osteopenia  Plan: Return in 6 months for physical examination. Order Prevnar.

## 2014-11-22 ENCOUNTER — Telehealth: Payer: Self-pay | Admitting: *Deleted

## 2014-11-22 LAB — MICROALBUMIN / CREATININE URINE RATIO
CREATININE, URINE: 163.5 mg/dL
MICROALB/CREAT RATIO: 6.7 mg/g (ref 0.0–30.0)
Microalb, Ur: 1.1 mg/dL (ref <2.0)

## 2014-11-22 NOTE — Telephone Encounter (Signed)
Left message for a return phone call to give her information about her BCI test financial issues.

## 2014-11-28 ENCOUNTER — Other Ambulatory Visit: Payer: Self-pay | Admitting: *Deleted

## 2014-11-28 ENCOUNTER — Ambulatory Visit (INDEPENDENT_AMBULATORY_CARE_PROVIDER_SITE_OTHER): Payer: Self-pay | Admitting: Family Medicine

## 2014-11-28 ENCOUNTER — Ambulatory Visit: Payer: 59 | Admitting: Pharmacist

## 2014-11-28 ENCOUNTER — Other Ambulatory Visit: Payer: Self-pay | Admitting: Internal Medicine

## 2014-11-28 VITALS — BP 122/76 | Ht 62.0 in | Wt 147.0 lb

## 2014-11-28 DIAGNOSIS — E119 Type 2 diabetes mellitus without complications: Secondary | ICD-10-CM

## 2014-11-28 NOTE — Telephone Encounter (Signed)
Refill x 6 months 

## 2014-11-28 NOTE — Patient Instructions (Addendum)
1)  Continue to limit carbs and starches, especially simple carbs and sweets 2)  Continue to stay active during 3)  Continue testing regularly 4)  Great job with reduced A1c! 5)  Follow-up in 3 months on Wednesday 03/05/15 @ 10:00 am  Great to see you today!

## 2014-11-28 NOTE — Progress Notes (Signed)
Subjective:  Patient is a 59 yo female with type 2 diabetes who presents today for 3 month follow-up as part of the employer-sponsored Link to Wellness program. Current diabetes regimen includes Januvia and Metformin. Patient also continues on daily ACEi and statin. Most recent MD follow-up was May 2016. Patient has a pending appt for 6 mo follow-up. No med changes or major health changes at this time.  Diabetes Assessment:  No changes to diabetes regimen. Patient does maintain good medication compliance. Most recent A1c was 5.9% which is at goal of less than 7%, and improved over the past 9 months.  Weight remains unchanged since last visit.  Patient did not bring meter today but is currently testing 2-3 times per week. Glucose monitoring occurs fasting and occasionally before bed. Glucose was 81 at last reading with max recently of 141 due to birthday cake. Hypoglycemia is rare. Patient does demonstrate appropriate correction of hypoglycemia.  Patient denies signs and symptoms of neuropathy including numbness/tingling/burning and symptoms of foot infection.  Patient is up to date on eye and dental exam.  Will have repeat eye exam next month.  Lifestyle Assessment:  Diet - Pt has maintained a low carb diet, specifically limiting simple carbs such as sweets, but also limiting starches.    Exercise - no routine exercise, but stays active at home, working in garden and keeping grandchildren   Plan and Goals: 1)  Continue to limit carbs and starches, especially simple carbs and sweets 2)  Continue to stay active during 3)  Continue testing regularly 4)  Great job with reduced A1c! 5)  Follow-up in 3 months on Wednesday 03/05/15 @ 10:00 am   Tilman Neat, PharmD Link to [place]  [phone]

## 2014-12-03 NOTE — Progress Notes (Signed)
Patient ID: Jill Richardson, female   DOB: 08/03/1955, 59 y.o.   MRN: [id number] ATTENDING PHYSICIAN NOTE: I have reviewed the chart and agree with the plan as detailed above. Dorcas Mcmurray MD Pager [phone]

## 2014-12-04 ENCOUNTER — Other Ambulatory Visit: Payer: Self-pay | Admitting: *Deleted

## 2014-12-04 NOTE — Telephone Encounter (Signed)
Patients digoxin refilled

## 2014-12-23 ENCOUNTER — Other Ambulatory Visit: Payer: Self-pay

## 2015-01-01 ENCOUNTER — Other Ambulatory Visit: Payer: Self-pay | Admitting: Internal Medicine

## 2015-01-20 ENCOUNTER — Other Ambulatory Visit: Payer: Self-pay | Admitting: Internal Medicine

## 2015-01-20 NOTE — Telephone Encounter (Signed)
Refill x 6 months 

## 2015-03-05 ENCOUNTER — Ambulatory Visit (INDEPENDENT_AMBULATORY_CARE_PROVIDER_SITE_OTHER): Payer: Self-pay | Admitting: Family Medicine

## 2015-03-05 ENCOUNTER — Encounter: Payer: Self-pay | Admitting: Pharmacist

## 2015-03-05 ENCOUNTER — Ambulatory Visit: Payer: 59 | Admitting: Pharmacist

## 2015-03-05 VITALS — BP 132/78 | Wt 146.0 lb

## 2015-03-05 DIAGNOSIS — E119 Type 2 diabetes mellitus without complications: Secondary | ICD-10-CM

## 2015-03-05 NOTE — Progress Notes (Signed)
Subjective:  Patient is a 59 yo female with type 2 diabetes who presents today for 3 month follow-up as part of the employer-sponsored Link to Wellness program. Current diabetes regimen includes Januvia and Metformin. Patient also continues on daily ACEi and statin. Most recent MD follow-up was May 2016. Patient has a pending appt for 6 mo follow-up in November. No med changes or major health changes at this time.  Diabetes Assessment:  No changes to diabetes regimen. Patient does maintain good medication compliance. Most recent A1c was 5.9% (May 2016) which is at goal of less than 7%, and improved over the past 9 months.  Weight remains unchanged since last visit.  Patient did not bring meter today but is currently testing 2-3 times per week. Glucose monitoring occurs fasting and occasionally before bed. Patient was using TrueResult, but is now being switched to TrueMetrix.  Fasting glucose ranges 72-98 and 2 hr post-prandial ranges 120-130s. Hypoglycemia is rare. Patient does demonstrate appropriate correction of hypoglycemia.  Patient denies signs and symptoms of neuropathy including numbness/tingling/burning and symptoms of foot infection.  Patient is up to date on eye and dental exam.   Lifestyle Assessment:  Diet - Pt has maintained a low carb diet, specifically limiting simple carbs such as sweets, but also limiting starches.   She would like to set a goal to reduce portions at restaurants.  We have discussed ways to do this by choosing better items on the menu or by halving the plate initially and saving half for leftovers.   Exercise - no routine exercise, but stays active at home, working in garden and keeping grandchildren 2-3 days per week.  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  I have encouraged her to walk around campus when weather permints. She also makes an effort to take stairs vs elevator while at [facility].   Plan and Goals: 1)   Continue to limit carbs and starches, especially simple carbs and sweets 2)  Focus on portion control when eating out 3)  Continue to stay active, attempt to add one day of walking at [facility] 4)  Continue testing regularly 5)  Follow-up in 3 months on Thursday Dec 15th @ 10:00 am  Great to see you today!   Tilman Neat, PharmD [facility]  [phone]

## 2015-03-05 NOTE — Patient Instructions (Signed)
1)  Continue to limit carbs and starches, especially simple carbs and sweets 2)  Focus on portion control when eating out 3)  Continue to stay active, attempt to add one day of walking at [facility] 4)  Continue testing regularly 5)  Follow-up in 3 months on Thursday Dec 15th @ 10:00 am  Great to see you today!

## 2015-03-11 NOTE — Progress Notes (Signed)
Patient ID: Bunny R Luckett, female   DOB: 11/19/1955, 59 y.o.   MRN: [id number] ATTENDING PHYSICIAN NOTE: I have reviewed the chart and agree with the plan as detailed above. Josemaria Brining MD Pager [phone]  

## 2015-03-19 ENCOUNTER — Encounter: Payer: Self-pay | Admitting: Family Medicine

## 2015-03-21 ENCOUNTER — Telehealth: Payer: Self-pay | Admitting: Internal Medicine

## 2015-03-21 ENCOUNTER — Ambulatory Visit: Payer: 59 | Admitting: Internal Medicine

## 2015-03-21 NOTE — Telephone Encounter (Signed)
Spoke with patient; she advised that she was able to obtain antibiotics and she does not need to be seen today after all.  We will cancel her appointment for this evening at 4:45.  Patient wasn't aware that she even fielded a note to our office.  Apparently she has been seeing another PCP??  Anyway, it seems that there was some uncertainty as I asked more questions and she seemed confused that she was coming here at 4:45 today at all.    She has antibiotics and will not be coming today at 4:45 p.m.

## 2015-03-24 NOTE — Telephone Encounter (Signed)
Dear Dema Severin Team She is not my continuity patient---it looks like she sees Dr. Renold Genta.  I signed a "Link to Wellness" visit done by the pharmacy regarding her diabetes so that may be how my name became attached to her chart.  Can you please call her and let her know this [facility] message went to  The wrong doctor--I am not sure how to forward it to Dr. Renold Genta or I would [facility]! Dorcas Mcmurray

## 2015-05-02 ENCOUNTER — Other Ambulatory Visit: Payer: Self-pay

## 2015-05-02 DIAGNOSIS — Z1231 Encounter for screening mammogram for malignant neoplasm of breast: Secondary | ICD-10-CM

## 2015-05-13 ENCOUNTER — Other Ambulatory Visit: Payer: 59 | Admitting: Internal Medicine

## 2015-05-16 ENCOUNTER — Other Ambulatory Visit: Payer: 59 | Admitting: Internal Medicine

## 2015-05-16 DIAGNOSIS — Z0000 Encounter for general adult medical examination without abnormal findings: Secondary | ICD-10-CM

## 2015-05-16 DIAGNOSIS — E8881 Metabolic syndrome: Secondary | ICD-10-CM

## 2015-05-16 DIAGNOSIS — I10 Essential (primary) hypertension: Secondary | ICD-10-CM

## 2015-05-16 DIAGNOSIS — Z1321 Encounter for screening for nutritional disorder: Secondary | ICD-10-CM

## 2015-05-16 DIAGNOSIS — E785 Hyperlipidemia, unspecified: Secondary | ICD-10-CM

## 2015-05-16 DIAGNOSIS — Z1329 Encounter for screening for other suspected endocrine disorder: Secondary | ICD-10-CM

## 2015-05-16 DIAGNOSIS — E118 Type 2 diabetes mellitus with unspecified complications: Secondary | ICD-10-CM

## 2015-05-16 LAB — LIPID PANEL
CHOL/HDL RATIO: 2.7 ratio (ref <=5.0)
CHOLESTEROL: 141 mg/dL (ref 125–200)
HDL: 52 mg/dL (ref >=46)
LDL Cholesterol: 66 mg/dL (ref <130)
Triglycerides: 115 mg/dL (ref <150)
VLDL: 23 mg/dL (ref <30)

## 2015-05-16 LAB — COMPLETE METABOLIC PANEL WITH GFR
ALT: 22 U/L (ref 6–29)
AST: 24 U/L (ref 10–35)
Albumin: 3.9 g/dL (ref 3.6–5.1)
Alkaline Phosphatase: 25 U/L — ABNORMAL LOW (ref 33–130)
BILIRUBIN TOTAL: 0.4 mg/dL (ref 0.2–1.2)
BUN: 23 mg/dL (ref 7–25)
CALCIUM: 8.9 mg/dL (ref 8.6–10.4)
CO2: 27 mmol/L (ref 20–31)
CREATININE: 0.95 mg/dL (ref 0.50–0.99)
Chloride: 106 mmol/L (ref 98–110)
GFR, EST AFRICAN AMERICAN: 75 mL/min (ref >=60)
GFR, Est Non African American: 65 mL/min (ref >=60)
Glucose, Bld: 86 mg/dL (ref 65–99)
Potassium: 4.5 mmol/L (ref 3.5–5.3)
Sodium: 140 mmol/L (ref 135–146)
TOTAL PROTEIN: 6.6 g/dL (ref 6.1–8.1)

## 2015-05-16 LAB — HEMOGLOBIN A1C
Hgb A1c MFr Bld: 6.2 % — ABNORMAL HIGH (ref <5.7)
MEAN PLASMA GLUCOSE: 131 mg/dL — AB (ref <117)

## 2015-05-16 LAB — TSH: TSH: 1.496 u[IU]/mL (ref 0.350–4.500)

## 2015-05-17 LAB — CBC WITH DIFFERENTIAL/PLATELET
BASOS ABS: 0.1 10*3/uL (ref 0.0–0.1)
Basophils Relative: 1 % (ref 0–1)
EOS ABS: 0.3 10*3/uL (ref 0.0–0.7)
Eosinophils Relative: 5 % (ref 0–5)
HCT: 39.6 % (ref 36.0–46.0)
Hemoglobin: 12.4 g/dL (ref 12.0–15.0)
Lymphocytes Relative: 27 % (ref 12–46)
Lymphs Abs: 1.5 10*3/uL (ref 0.7–4.0)
MCH: 27.9 pg (ref 26.0–34.0)
MCHC: 31.3 g/dL (ref 30.0–36.0)
MCV: 89 fL (ref 78.0–100.0)
MPV: 8.9 fL (ref 8.6–12.4)
Monocytes Absolute: 0.4 10*3/uL (ref 0.1–1.0)
Monocytes Relative: 7 % (ref 3–12)
NEUTROS PCT: 60 % (ref 43–77)
Neutro Abs: 3.4 10*3/uL (ref 1.7–7.7)
Platelets: 360 10*3/uL (ref 150–400)
RBC: 4.45 MIL/uL (ref 3.87–5.11)
RDW: 15.4 % (ref 11.5–15.5)
WBC: 5.7 10*3/uL (ref 4.0–10.5)

## 2015-05-17 LAB — VITAMIN D 25 HYDROXY (VIT D DEFICIENCY, FRACTURES): Vit D, 25-Hydroxy: 32 ng/mL (ref 30–100)

## 2015-05-20 ENCOUNTER — Ambulatory Visit (INDEPENDENT_AMBULATORY_CARE_PROVIDER_SITE_OTHER): Payer: 59 | Admitting: Internal Medicine

## 2015-05-20 ENCOUNTER — Encounter: Payer: Self-pay | Admitting: Internal Medicine

## 2015-05-20 VITALS — BP 138/76 | HR 76 | Temp 98.0°F | Resp 20 | Ht 62.0 in | Wt 145.0 lb

## 2015-05-20 DIAGNOSIS — Z23 Encounter for immunization: Secondary | ICD-10-CM | POA: Diagnosis not present

## 2015-05-20 DIAGNOSIS — Z853 Personal history of malignant neoplasm of breast: Secondary | ICD-10-CM | POA: Diagnosis not present

## 2015-05-20 DIAGNOSIS — Z8739 Personal history of other diseases of the musculoskeletal system and connective tissue: Secondary | ICD-10-CM | POA: Diagnosis not present

## 2015-05-20 DIAGNOSIS — I471 Supraventricular tachycardia: Secondary | ICD-10-CM | POA: Diagnosis not present

## 2015-05-20 DIAGNOSIS — E785 Hyperlipidemia, unspecified: Secondary | ICD-10-CM | POA: Diagnosis not present

## 2015-05-20 DIAGNOSIS — E119 Type 2 diabetes mellitus without complications: Secondary | ICD-10-CM | POA: Diagnosis not present

## 2015-05-20 DIAGNOSIS — D239 Other benign neoplasm of skin, unspecified: Secondary | ICD-10-CM

## 2015-05-20 DIAGNOSIS — Z87898 Personal history of other specified conditions: Secondary | ICD-10-CM | POA: Diagnosis not present

## 2015-05-20 DIAGNOSIS — M797 Fibromyalgia: Secondary | ICD-10-CM | POA: Diagnosis not present

## 2015-05-20 DIAGNOSIS — Z0000 Encounter for general adult medical examination without abnormal findings: Secondary | ICD-10-CM

## 2015-05-20 DIAGNOSIS — Z8659 Personal history of other mental and behavioral disorders: Secondary | ICD-10-CM

## 2015-05-20 DIAGNOSIS — E669 Obesity, unspecified: Secondary | ICD-10-CM

## 2015-06-11 ENCOUNTER — Ambulatory Visit
Admission: RE | Admit: 2015-06-11 | Discharge: 2015-06-11 | Disposition: A | Discharge status: Home / Self Care | Payer: 59 | Source: Ambulatory Visit

## 2015-06-11 DIAGNOSIS — Z1231 Encounter for screening mammogram for malignant neoplasm of breast: Secondary | ICD-10-CM

## 2015-06-12 ENCOUNTER — Ambulatory Visit: Payer: 59 | Admitting: Pharmacist

## 2015-06-17 ENCOUNTER — Ambulatory Visit (INDEPENDENT_AMBULATORY_CARE_PROVIDER_SITE_OTHER): Payer: Self-pay | Admitting: Family Medicine

## 2015-06-17 ENCOUNTER — Other Ambulatory Visit: Payer: Self-pay | Admitting: Internal Medicine

## 2015-06-17 ENCOUNTER — Encounter: Payer: Self-pay | Admitting: Pharmacist

## 2015-06-17 VITALS — BP 132/82 | Wt 146.0 lb

## 2015-06-17 DIAGNOSIS — E119 Type 2 diabetes mellitus without complications: Secondary | ICD-10-CM

## 2015-06-17 NOTE — Progress Notes (Signed)
Subjective:  Patient is a 59 yo female with type 2 diabetes who presents today for 3 month follow-up as part of the employer-sponsored Link to Wellness program. Current diabetes regimen includes Januvia and Metformin. Patient also continues on daily ACEi and statin. Most recent MD follow-up was Nov 2016. Patient has a pending appt for 6 mo follow-up in May.  No med changes or major health changes at this time.  Labs normal at recent follow-up appt.  Of note, Vit D low-normal, pt will start 5000 IU Vit D daily to help with this.     Diabetes Assessment:  No changes to diabetes regimen. Patient does maintain good medication compliance. Most recent A1c was 6.1% (prev 5.9%) which is at goal of less than 7%.  Weight remains unchanged since last visit.  Patient did not bring meter today but is currently testing 2-3 times per week. Glucose monitoring occurs fasting and occasionally before bed. Patient continues using TrueMetrix.  Fasting glucose ranges 70-100 and 2-3 hr post-prandial is consistently less than 140. Hypoglycemia is rare.  Patient does demonstrate appropriate correction of hypoglycemia.  Patient denies signs and symptoms of neuropathy including numbness/tingling/burning and symptoms of foot infection.  Patient is up to date on eye and dental exam.    Lifestyle Assessment:  Diet - No major changes.  Portion sizes are under good control.  Patient has made an effort to practice good self control and avoiding second helpings.  She has also started dividing her plate at restaurants as to eat only half and taking the remainder home. She is attempting to limiting sweets this holiday season and is trying to avoid being around all the sweets at all.   Exercise - no routine exercise, but stays active at home keeping grandchildren 2-3 days per week.  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  She takes stairs instead of elevator when possible and is now  able to climb 4 flights of stairs without being winded.       Plan and Goals: 1)  Continue to limit carbs and starches, especially simple carbs and sweets during Christmas 2) Continue to stay active, attempt to continue walking even during colder weather 3)  Continue testing regularly 4)  Follow-up in 3 months on Tuesday June 20th @ 10:00 am  Great to see you today!   Tilman Neat, PharmD [facility]  [phone]

## 2015-06-17 NOTE — Patient Instructions (Signed)
1)  Continue to limit carbs and starches, especially simple carbs and sweets during Christmas 2) Continue to stay active, attempt to continue walking even during colder weather 3)  Continue testing regularly 4)  Follow-up in 3 months on Tuesday June 20th @ 10:00 am  Great to see you today!

## 2015-06-27 NOTE — Progress Notes (Signed)
I have reviewed this pharmacist's note and agree  

## 2015-07-08 MED FILL — RAMIPRIL 2.5 MG CAPSULE: 2.5 | 90 days supply | Qty: 90 | Fill #2

## 2015-07-08 MED FILL — OXAPROZIN 600 MG TABLET: 600 | 90 days supply | Qty: 180 | Fill #1

## 2015-07-08 MED FILL — PANTOPRAZOLE SOD DR 40 MG T: 40 | 90 days supply | Qty: 90 | Fill #2

## 2015-07-08 MED FILL — DIGOXIN 250 MCG TABLET: 250 | 90 days supply | Qty: 90 | Fill #2

## 2015-07-29 ENCOUNTER — Encounter: Payer: Self-pay | Admitting: Internal Medicine

## 2015-07-29 NOTE — Progress Notes (Signed)
Subjective:    Patient ID: Jill Richardson, female    DOB: 02-13-1956, 60 y.o.   MRN: [id number]  HPI 60 year old White Female in today for health maintenance exam and evaluation of multiple medical issues including low back pain with lumbar fusion L5-S1 in 2005 using the Gill procedure with cages and pedicle screws by Dr. Carloyn Manner. She has a history of breast cancer, controlled type 2 diabetes, hyperlipidemia, GE reflux, fibromyalgia, obesity, remote history of supraventricular tachycardia.  She is allergic to Lodine. 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 Able to tolerate Daypro for musculoskeletal pain.  Left oophorectomy March 1997. Had exploratory laparotomy for small bowel obstruction one month later.  Colonoscopy done 2008  History of dysplastic nevus of the shoulder 2008  Long-standing history of fibromyalgia syndrome dating back to the 1990s  Patient had invasive ductal carcinoma left breast diagnosed January 2008. She's had 2 pregnancies. No miscarriages. Menarche at age 19. Menopausal around age 60. She took hormone replacement therapy until her cancer diagnosis. Tumor was ER/PR positive with evidence of lymphovascular invasion. Nodes were negative. She had radiation and lumpectomy. Radiation was completed April 2008. She was treated with tamoxifen and Femara.  She takes Crestor for hyperlipidemia and Daypro for fibromyalgia. Has Flexeril for occasional back pain. Takes Ativan for insomnia and occasional tramadol for back pain. Occasionally takes Norco for back pain. History of GE reflux treated with PPI. Is on metformin and Januvia for diabetes mellitus with good control.  Social history: She is married. Husband is disabled due to chronic brain injury after an accident involving a tree falling on him in the mid 1990s. 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 2 grandchildren. 2 adult  sons.  Family history: Father with history of diabetes and hypertension died from complications of heart failure. Brother with history of hypertension. Mother in good health. Sister in good health.       Review of Systems        Physical Exam  Constitutional: She is oriented to person, place, and time. She appears well-developed and well-nourished. No distress.  HENT:  Head: Normocephalic and atraumatic.  Right Ear: External ear normal.  Left Ear: External ear normal.  Eyes: Conjunctivae and EOM are normal. Pupils are equal, round, and reactive to light. Right eye exhibits no discharge. Left eye exhibits no discharge. No scleral icterus.  Neck: Neck supple. No JVD present. No thyromegaly present.  Cardiovascular: Normal rate, regular rhythm, normal heart sounds and intact distal pulses.   No murmur heard. Pulmonary/Chest: Effort normal and breath sounds normal. No respiratory distress. She has no wheezes. She has no rales. She exhibits no tenderness.  Breasts without masses  Abdominal: Soft. Bowel sounds are normal. She exhibits no distension and no mass. There is no tenderness. There is no rebound and no guarding.  Genitourinary:  Deferred to Dr. Vanessa Kick. Had Pap smear 2013  Musculoskeletal: She exhibits no edema.  Neurological: She is alert and oriented to person, place, and time. She has normal reflexes. No cranial nerve deficit.  Skin: Skin is warm and dry. No rash noted. She is not diaphoretic.  Psychiatric: She has a normal mood and affect. Her behavior is normal. Judgment and thought content normal.  Vitals reviewed.         Assessment & Plan:   history of fibromyalgia syndrome treated with Flexeril, Daypro  History of  back pain with history of L5-S1 fusion 2005  Controlled type 2 diabetes mellitus  History of breast cancer  History of supraventricular tachycardia stable with Lovenox and  Hyperlipidemia treated with statin medication and TriCor  History  of depression  Obesity-continue diet and exercise  History of insomnia. History of dysplastic nevus 2008  Plan: Continue same medications and return in 6 months. Prevnar given today. Labs reviewed. Hemoglobin A1c 6.2%. Lipid panel normal.  Had flu vaccine through employment.

## 2015-07-29 NOTE — Patient Instructions (Signed)
It was pleasure to see you today. Prevnar 13 given. Continue same medications and return in 6 months. Flu vaccine given through employment.

## 2015-08-20 MED FILL — VENLAFAXINE HCL ER 37.5 MG: 37.5 | 90 days supply | Qty: 90 | Fill #3

## 2015-08-20 MED FILL — JANUVIA 100 MG TABLET: 100 | 90 days supply | Qty: 90 | Fill #2

## 2015-09-17 MED FILL — FENOFIBRATE 160 MG TABLET: 160 | 90 days supply | Qty: 90 | Fill #3

## 2015-09-19 MED FILL — metFORMIN HCL 500 MG TABS: 500 | 90 days supply | Qty: 180 | Fill #1

## 2015-10-01 DIAGNOSIS — Z01419 Encounter for gynecological examination (general) (routine) without abnormal findings: Secondary | ICD-10-CM | POA: Diagnosis not present

## 2015-10-01 DIAGNOSIS — N951 Menopausal and female climacteric states: Secondary | ICD-10-CM | POA: Insufficient documentation

## 2015-10-01 DIAGNOSIS — Z6826 Body mass index (BMI) 26.0-26.9, adult: Secondary | ICD-10-CM | POA: Diagnosis not present

## 2015-10-06 MED FILL — ROSUVASTATIN CALCIUM 10 MG: 10 | 90 days supply | Qty: 90 | Fill #0

## 2015-10-06 MED FILL — RAMIPRIL 2.5 MG CAPSULE: 2.5 | 90 days supply | Qty: 90 | Fill #3

## 2015-10-06 MED FILL — FAMCICLOVIR 500 MG TABLET: 500 | 1 days supply | Qty: 4 | Fill #0

## 2015-10-06 MED FILL — DIGOXIN 250 MCG TABLET: 250 | 90 days supply | Qty: 90 | Fill #3

## 2015-10-20 MED FILL — PANTOPRAZOLE SOD DR 40 MG T: 40 | 90 days supply | Qty: 90 | Fill #3

## 2015-11-03 ENCOUNTER — Other Ambulatory Visit: Payer: 59 | Admitting: Internal Medicine

## 2015-11-03 DIAGNOSIS — E785 Hyperlipidemia, unspecified: Secondary | ICD-10-CM

## 2015-11-03 DIAGNOSIS — E119 Type 2 diabetes mellitus without complications: Secondary | ICD-10-CM | POA: Diagnosis not present

## 2015-11-03 DIAGNOSIS — Z79899 Other long term (current) drug therapy: Secondary | ICD-10-CM

## 2015-11-03 LAB — HEMOGLOBIN A1C
HEMOGLOBIN A1C: 6.2 % — AB (ref <5.7)
Mean Plasma Glucose: 131 mg/dL

## 2015-11-04 LAB — HEPATIC FUNCTION PANEL
ALBUMIN: 3.8 g/dL (ref 3.6–5.1)
ALK PHOS: 26 U/L — AB (ref 33–130)
ALT: 20 U/L (ref 6–29)
AST: 20 U/L (ref 10–35)
BILIRUBIN TOTAL: 0.4 mg/dL (ref 0.2–1.2)
Bilirubin, Direct: 0.1 mg/dL (ref <=0.2)
Indirect Bilirubin: 0.3 mg/dL (ref 0.2–1.2)
TOTAL PROTEIN: 6.3 g/dL (ref 6.1–8.1)

## 2015-11-04 LAB — LIPID PANEL
Cholesterol: 151 mg/dL (ref 125–200)
HDL: 54 mg/dL (ref >=46)
LDL CALC: 76 mg/dL (ref <130)
TRIGLYCERIDES: 103 mg/dL (ref <150)
Total CHOL/HDL Ratio: 2.8 Ratio (ref <=5.0)
VLDL: 21 mg/dL (ref <30)

## 2015-11-04 LAB — MICROALBUMIN, URINE: MICROALB UR: 0.9 mg/dL

## 2015-11-10 ENCOUNTER — Encounter: Payer: Self-pay | Admitting: Internal Medicine

## 2015-11-10 ENCOUNTER — Ambulatory Visit (INDEPENDENT_AMBULATORY_CARE_PROVIDER_SITE_OTHER): Payer: 59 | Admitting: Internal Medicine

## 2015-11-10 VITALS — BP 116/70 | HR 69 | Temp 97.3°F | Wt 144.0 lb

## 2015-11-10 DIAGNOSIS — F329 Major depressive disorder, single episode, unspecified: Secondary | ICD-10-CM | POA: Diagnosis not present

## 2015-11-10 DIAGNOSIS — M797 Fibromyalgia: Secondary | ICD-10-CM | POA: Diagnosis not present

## 2015-11-10 DIAGNOSIS — Z658 Other specified problems related to psychosocial circumstances: Secondary | ICD-10-CM

## 2015-11-10 DIAGNOSIS — Z853 Personal history of malignant neoplasm of breast: Secondary | ICD-10-CM

## 2015-11-10 DIAGNOSIS — E785 Hyperlipidemia, unspecified: Secondary | ICD-10-CM | POA: Diagnosis not present

## 2015-11-10 DIAGNOSIS — F439 Reaction to severe stress, unspecified: Secondary | ICD-10-CM

## 2015-11-10 DIAGNOSIS — G4700 Insomnia, unspecified: Secondary | ICD-10-CM

## 2015-11-10 DIAGNOSIS — E119 Type 2 diabetes mellitus without complications: Secondary | ICD-10-CM | POA: Diagnosis not present

## 2015-11-10 DIAGNOSIS — E669 Obesity, unspecified: Secondary | ICD-10-CM | POA: Diagnosis not present

## 2015-11-10 DIAGNOSIS — K219 Gastro-esophageal reflux disease without esophagitis: Secondary | ICD-10-CM

## 2015-11-10 DIAGNOSIS — E8881 Metabolic syndrome: Secondary | ICD-10-CM

## 2015-11-10 DIAGNOSIS — F32A Depression, unspecified: Secondary | ICD-10-CM

## 2015-11-10 MED ORDER — LORAZEPAM 2 MG PO TABS: ORAL_TABLET | ORAL | Status: DC

## 2015-11-10 MED ORDER — VENLAFAXINE HCL ER 37.5 MG PO CP24: 37.5000 mg | ORAL_CAPSULE | Freq: Every day | ORAL | Status: DC

## 2015-11-10 MED FILL — VENLAFAXINE HCL ER 37.5 MG: 37.5 | 90 days supply | Qty: 90 | Fill #0

## 2015-11-10 MED FILL — LORazepam 2 MG TABS: 2 | 90 days supply | Qty: 90 | Fill #0

## 2015-11-10 NOTE — Patient Instructions (Signed)
It was a pleasure to see you today. Please continue diet and exercise regimen. Return in 6 months for physical exam. Continue same medications.

## 2015-11-10 NOTE — Progress Notes (Signed)
   Subjective:    Patient ID: Jill Richardson, female    DOB: [birth year], 60 y.o.   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 History of fibromyalgia, insomnia, breast cancer, controlled type 2 diabetes mellitus, obesity, metabolic syndrome, hyperlipidemia, GE reflux, depression. Immunizations are up-to-date. Has lost 1 pound since November 2016. Remote history of paroxysmal supraventricular tachycardia which is controlled with medication. Long-standing history of fibromyalgia syndrome dating back to the 1990s. 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 Tumor was ER/PR positive with evidence of lymphovascular invasion. Nodes were negative. She had radiation and lumpectomy. She was treated with tamoxifen and Femara.  She takes Crestor for hyperlipidemia and Daypro for fibromyalgia. Occasionally takes Flexeril for back pain. Takes Ativan for insomnia. Takes PPI for GE reflux. For diabetes mellitus is on Januvia and metformin. Hemoglobin A1c is stable at 6.2% which is the same as it was at time of physical exam. Lipid panel liver functions are normal.  Considerable situational stress. One of her son still lives at home and cannot find a job. He has a degree in Careers information officer. Husband is disabled. 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 Nodes none. Neck supple without JVD thyromegaly or carotid bruits. Chest clear to auscultation. Cardiac exam regular rate and rhythm normal S1 and S2. Extremities without edema.       Assessment & Plan:  Controlled type 2 diabetes mellitus-stable hemoglobin A1c on medication  Hyperlipidemia-lipid panel liver functions within normal limits on statin medication  History of supraventricular tachycardia with no recurrence recently controlled with medication  History of left breast  cancer  Obesity-encouraged diet exercise and weight loss  Metabolic syndrome  GE reflux-continue PPI  Depression  Insomnia-Ativan refilled  Fibromyalgia-treated with NSAID  Situational stress-asked patient if she wanted to consider counseling but at this time she does not  Plan: Ativan refilled. Return in 6 months for physical examination or when necessary.

## 2015-11-20 ENCOUNTER — Telehealth: Payer: Self-pay | Admitting: Hematology and Oncology

## 2015-11-20 ENCOUNTER — Ambulatory Visit (HOSPITAL_BASED_OUTPATIENT_CLINIC_OR_DEPARTMENT_OTHER): Payer: 59 | Admitting: Hematology and Oncology

## 2015-11-20 ENCOUNTER — Encounter: Payer: Self-pay | Admitting: Hematology and Oncology

## 2015-11-20 VITALS — BP 130/72 | HR 80 | Temp 98.5°F | Resp 18 | Wt 145.3 lb

## 2015-11-20 DIAGNOSIS — C50412 Malignant neoplasm of upper-outer quadrant of left female breast: Secondary | ICD-10-CM | POA: Diagnosis not present

## 2015-11-20 NOTE — Telephone Encounter (Signed)
appt made and avs printed °

## 2015-11-20 NOTE — Assessment & Plan Note (Signed)
Left breast invasive mammary cancer diagnosed 06/16/2006 status post lumpectomy 07/15/2006 IDC, 1.4 cm, grade 2, 2 SLN negative, yet 100%, PR percent, Ki-67 25%, HER-2 negative status post radiation therapy and 2 years of tamoxifen followed by Femara discontinued April 2016.  Breast cancer index result: Low risk and low benefit to extended adjuvant therapy Hot flashes: Patient tried to discontinue Effexor but her hot flashes returned and she is continuing on Effexor for now. I agreed with her primary care physician Dr. Renold Genta that she should continue to get bone density test every other year because she is postmenopausal. Her last bone density done in 2013 showed a T score of -1.2.  Breast Cancer Surveillance: 1. Breast exam 11/20/2015: Normal 2. Mammogram 06/12/2015 No abnormalities. Postsurgical changes. Breast Density Category B. I recommended that she get 3-D mammograms for surveillance. Discussed the differences between different breast density categories.  Return to clinic in 1 year for follow-up with survivorship clinic

## 2015-11-20 NOTE — Progress Notes (Signed)
Patient Care Team: Elby Showers, MD as PCP - General (Internal Medicine) Harle Battiest, MD (Obstetrics and Gynecology) Eston Esters, MD (Hematology and Oncology) Tyler Pita, MD (Radiation Oncology) Neldon Mc, MD (General Surgery) Jacob Moores, [facility] as [facility] (Pharmacist)  DIAGNOSIS: No matching staging information was found for the patient.  SUMMARY OF ONCOLOGIC HISTORY:   Breast cancer of upper-outer quadrant of left female breast ([place])   07/05/2006 Surgery Left breast lumpectomy: 1.4cm grade 2 lesion of invasive ductal carcinoma. ER 100%, PR 100%, HER-2/neu negative Ki-67 of 25%. 0/2 SLN   08/30/2006 - 10/27/2006 Radiation Therapy Adjuvant radiation therapy   11/21/2006 - 07/25/2013 Anti-estrogen oral therapy Tamoxifen 2 years then changed to Femara 6 years    CHIEF COMPLIANT: Breast cancer surveillance  INTERVAL HISTORY: Jill Richardson is a 60 year old with above-mentioned history left breast cancer treated with lumpectomy radiation and 7 years of antiestrogen therapy finished in generally 2015. She is here for annual follow-up. She reports no new problems or concerns. She continues to have hot flashes for which she uses Effexor. Decreased degree of hot flashes. She denies any lumps or nodules in the breasts.  REVIEW OF SYSTEMS:   Constitutional: Denies fevers, chills or abnormal weight loss Eyes: Denies blurriness of vision Ears, nose, mouth, throat, and face: Denies mucositis or sore throat Respiratory: Denies cough, dyspnea or wheezes Cardiovascular: Denies palpitation, chest discomfort Gastrointestinal:  Denies nausea, heartburn or change in bowel habits Skin: Denies abnormal skin rashes Lymphatics: Denies new lymphadenopathy or easy bruising Neurological:Denies numbness, tingling or new weaknesses Behavioral/Psych: Mood is stable, no new changes  Extremities: No lower extremity edema Breast:  denies any pain or lumps or  nodules in either breasts All other systems were reviewed with the patient and are negative.  I have reviewed the past medical history, past surgical history, social history and family history with the patient and they are unchanged from previous note.  ALLERGIES:  is allergic to lipitor and lodine.  MEDICATIONS:  Current Outpatient Prescriptions  Medication Sig Dispense Refill  . calcium-vitamin D (OSCAL WITH D) 500-200 MG-UNIT per tablet Take 1 tablet by mouth 2 (two) times daily.    . CRESTOR 10 MG tablet TAKE 1 TABLET BY MOUTH DAILY. 90 tablet 1  . cyclobenzaprine (FLEXERIL) 10 MG tablet One po qhs 90 tablet 1  . digoxin (LANOXIN) 0.25 MG tablet TAKE 1 TABLET BY MOUTH DAILY 90 tablet PRN  . famciclovir (FAMVIR) 500 MG tablet   1  . fenofibrate 160 MG tablet TAKE 1 TABLET BY MOUTH DAILY 90 tablet PRN  . glucosamine-chondroitin 500-400 MG tablet Take 1 tablet by mouth daily.    Marland Kitchen JANUVIA 100 MG tablet TAKE 1 TABLET (100 MG TOTAL) BY MOUTH DAILY. 90 tablet 3  . LORazepam (ATIVAN) 2 MG tablet TAKE 1 TABLET BY MOUTH ONCE DAILY AT BEDTIME AS NEEDED 90 tablet 1  . metFORMIN (GLUCOPHAGE) 500 MG tablet TAKE 1 TABLET BY MOUTH TWICE DAILY 180 tablet 3  . Multiple Vitamin (MULTIVITAMIN) capsule Take 1 capsule by mouth daily.      Marland Kitchen oxaprozin (DAYPRO) 600 MG tablet TAKE 2 TABLETS BY MOUTH DAILY 180 tablet 1  . pantoprazole (PROTONIX) 40 MG tablet TAKE 1 TABLET BY MOUTH DAILY 90 tablet PRN  . ramipril (ALTACE) 2.5 MG capsule TAKE 1 CAPSULE BY MOUTH DAILY. 90 capsule 3  . traMADol (ULTRAM) 50 MG tablet Take 1 tablet (50 mg total) by mouth every 8 (eight) hours as  needed. 270 tablet 0  . venlafaxine XR (EFFEXOR-XR) 37.5 MG 24 hr capsule Take 1 capsule (37.5 mg total) by mouth daily. 90 capsule 3   No current facility-administered medications for this visit.    PHYSICAL EXAMINATION: ECOG PERFORMANCE STATUS: 1 - Symptomatic but completely ambulatory  Filed Vitals:   11/20/15 1406  BP: 130/72    Pulse: 80  Temp: 98.5 F (36.9 C)  Resp: 18   Filed Weights   11/20/15 1406  Weight: 145 lb 4.8 oz (65.908 kg)    GENERAL:alert, no distress and comfortable SKIN: skin color, texture, turgor are normal, no rashes or significant lesions EYES: normal, Conjunctiva are pink and non-injected, sclera clear OROPHARYNX:no exudate, no erythema and lips, buccal mucosa, and tongue normal  NECK: supple, thyroid normal size, non-tender, without nodularity LYMPH:  no palpable lymphadenopathy in the cervical, axillary or inguinal LUNGS: clear to auscultation and percussion with normal breathing effort HEART: regular rate & rhythm and no murmurs and no lower extremity edema ABDOMEN:abdomen soft, non-tender and normal bowel sounds MUSCULOSKELETAL:no cyanosis of digits and no clubbing  NEURO: alert & oriented x 3 with fluent speech, no focal motor/sensory deficits EXTREMITIES: No lower extremity edema BREAST: No palpable masses or nodules in either right or left breasts. No palpable axillary supraclavicular or infraclavicular adenopathy no breast tenderness or nipple discharge. (exam performed in the presence of a chaperone)  LABORATORY DATA:  I have reviewed the data as listed   Chemistry      Component Value Date/Time   NA 140 05/16/2015 0931   NA 144 05/14/2014 0822   K 4.5 05/16/2015 0931   K 4.3 05/14/2014 0822   CL 106 05/16/2015 0931   CO2 27 05/16/2015 0931   CO2 26 05/14/2014 0822   BUN 23 05/16/2015 0931   BUN 23.3 05/14/2014 0822   CREATININE 0.95 05/16/2015 0931   CREATININE 1.0 05/14/2014 0822   CREATININE 1.00 12/12/2013 1930      Component Value Date/Time   CALCIUM 8.9 05/16/2015 0931   CALCIUM 9.9 05/14/2014 0822   ALKPHOS 26* 11/03/2015 1047   ALKPHOS 28* 05/14/2014 0822   AST 20 11/03/2015 1047   AST 23 05/14/2014 0822   ALT 20 11/03/2015 1047   ALT 28 05/14/2014 0822   BILITOT 0.4 11/03/2015 1047   BILITOT 0.34 05/14/2014 0822       Lab Results  Component  Value Date   WBC 5.7 05/16/2015   HGB 12.4 05/16/2015   HCT 39.6 05/16/2015   MCV 89.0 05/16/2015   PLT 360 05/16/2015   NEUTROABS 3.4 05/16/2015     ASSESSMENT & PLAN:  Breast cancer of upper-outer quadrant of left female breast ([place]) Left breast invasive mammary cancer diagnosed 06/16/2006 status post lumpectomy 07/15/2006 IDC, 1.4 cm, grade 2, 2 SLN negative, yet 100%, PR percent, Ki-67 25%, HER-2 negative status post radiation therapy and 2 years of tamoxifen followed by Femara discontinued April 2016.  Breast cancer index result: Low risk and low benefit to extended adjuvant therapy Hot flashes: Patient tried to discontinue Effexor but her hot flashes returned and she is continuing on Effexor for now. I agreed with her primary care physician Dr. Renold Genta that she should continue to get bone density test every other year because she is postmenopausal. Her last bone density done in 2013 showed a T score of -1.2.  Breast Cancer Surveillance: 1. Breast exam 11/20/2015: Normal 2. Mammogram 06/12/2015 No abnormalities. Postsurgical changes. Breast Density Category B. I recommended that she get  3-D mammograms for surveillance. Discussed the differences between different breast density categories.  Return to clinic in 1 year for follow-up with survivorship clinic   No orders of the defined types were placed in this encounter.   The patient has a good understanding of the overall plan. she agrees with it. she will call with any problems that may develop before the next visit here.   Rulon Eisenmenger, MD 11/20/2015

## 2015-11-20 NOTE — Telephone Encounter (Signed)
ERROR

## 2015-11-25 ENCOUNTER — Other Ambulatory Visit: Payer: Self-pay | Admitting: Internal Medicine

## 2015-11-25 MED FILL — JANUVIA 100 MG TABLET: 100 | 90 days supply | Qty: 90 | Fill #3

## 2015-11-25 MED FILL — OXAPROZIN 600 MG TABLET: 600 | 90 days supply | Qty: 180 | Fill #0

## 2015-12-16 ENCOUNTER — Other Ambulatory Visit: Payer: Self-pay | Admitting: Internal Medicine

## 2015-12-16 ENCOUNTER — Encounter: Payer: Self-pay | Admitting: Pharmacist

## 2015-12-16 ENCOUNTER — Other Ambulatory Visit: Payer: Self-pay | Admitting: Pharmacist

## 2015-12-16 VITALS — BP 141/92 | HR 71 | Wt 140.0 lb

## 2015-12-16 DIAGNOSIS — E119 Type 2 diabetes mellitus without complications: Secondary | ICD-10-CM

## 2015-12-16 NOTE — Patient Outreach (Signed)
[facility]) Care Management  12/16/2015  Jill Richardson 04-Jun-1956 [id number]   Subjective:  Patient is a 60 yo female with type 2 diabetes who presents today for 3 month follow-up as part of the employer-sponsored Link to Wellness program. Current diabetes regimen includes Januvia and Metformin. Patient also continues on daily ACEi and statin. Most recent MD follow-up was May 2017. Patient has a pending appt for 6 mo follow-up in Nov. No med changes or major health changes at this time. Labs normal at recent follow-up appt.   Diabetes Assessment:  No changes to diabetes regimen. Patient does maintain good medication compliance. Most recent A1c was 6.2% (prev 6.1%) which is at goal of less than 7%. Weight decreased by 5 lb since last visit. Patient did not bring meter today but is currently testing 2-3 times per week. Glucose monitoring occurs fasting and occasionally before bed. Patient continues using TrueMetrix. Fasting glucose ranges 70-100, with only one recent reading of 60s, and 2-3 hr post-prandial is consistently less than 140. Hypoglycemia is rare. Patient does demonstrate appropriate correction of hypoglycemia. Patient denies signs and symptoms of neuropathy including numbness/tingling/burning and symptoms of foot infection. Patient is up to date on eye and dental exam with next eye exam scheduled for summer 2017.   Lifestyle Assessment:  Diet - No major changes. Portion sizes remain under good control. Patient has made an effort to practice good self control and avoiding second helpings. She has also started dividing her plate at restaurants as to eat only half and taking the remainder home. She is attempting to limit starchy carbs such as pasta and potatoes.  She has pasta once weekly, but uses [place] pasta, a low glycemic index.  She prepares potatoes on average three times per week, but usually bakes or roasts, avoids frying.    Exercise - Stays  active at home keeping grandchildren 2-3 days per week. 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  In addition she walks 2-3 times weekly in the evenings, for 15 minutes each time.        Plan and Goals: 1) Continue to limit carbs and starches, especially pasta and potatoes 2) Continue to stay active, attempt to continue walking at least 2-3 days per week 3) Continue testing regularly and maintain compliance with medications 4) Follow-up in 6 months on Tuesday December 12th @ 10:00 am  Great to see you today!

## 2015-12-16 NOTE — Telephone Encounter (Signed)
Refill x 6 months 

## 2015-12-29 MED FILL — DIGITEK 250 MCG TABLET: 250 | 90 days supply | Qty: 90 | Fill #0

## 2015-12-29 MED FILL — ROSUVASTATIN CALCIUM 10 MG: 10 | 90 days supply | Qty: 90 | Fill #0

## 2015-12-29 MED FILL — FENOFIBRATE 160 MG TABLET: 160 | 90 days supply | Qty: 90 | Fill #0

## 2015-12-29 MED FILL — RAMIPRIL 2.5 MG CAPSULE: 2.5 | 90 days supply | Qty: 90 | Fill #0

## 2016-01-05 MED FILL — metFORMIN HCL 500 MG TABS: 500 | 90 days supply | Qty: 180 | Fill #2

## 2016-01-20 MED FILL — PANTOPRAZOLE SOD DR 40 MG T: 40 | 90 days supply | Qty: 90 | Fill #0

## 2016-02-17 MED FILL — VENLAFAXINE HCL ER 37.5 MG: 37.5 | 90 days supply | Qty: 90 | Fill #1

## 2016-02-23 ENCOUNTER — Other Ambulatory Visit: Payer: Self-pay | Admitting: Internal Medicine

## 2016-02-23 MED FILL — JANUVIA 100 MG TABLET: 100 | 90 days supply | Qty: 90 | Fill #0

## 2016-02-24 MED FILL — TRUE METRIX GLUCOSE TEST ST: 90 days supply | Qty: 100 | Fill #1

## 2016-03-02 DIAGNOSIS — H524 Presbyopia: Secondary | ICD-10-CM | POA: Diagnosis not present

## 2016-03-02 LAB — HM DIABETES EYE EXAM

## 2016-03-22 MED FILL — LORazepam 2 MG TABS: 2 | 90 days supply | Qty: 90 | Fill #1

## 2016-03-30 MED FILL — metFORMIN HCL 500 MG TABS: 500 | 90 days supply | Qty: 180 | Fill #3

## 2016-04-07 MED FILL — DIGITEK 250 MCG TABLET: 250 | 90 days supply | Qty: 90 | Fill #1

## 2016-04-07 MED FILL — ACYCLOVIR 5% OINTMENT: 5 | 7 days supply | Qty: 15 | Fill #0

## 2016-04-07 MED FILL — ROSUVASTATIN CALCIUM 10 MG: 10 | 90 days supply | Qty: 90 | Fill #1

## 2016-04-07 MED FILL — RAMIPRIL 2.5 MG CAPSULE: 2.5 | 90 days supply | Qty: 90 | Fill #1

## 2016-04-07 MED FILL — FENOFIBRATE 160 MG TABLET: 160 | 90 days supply | Qty: 90 | Fill #1

## 2016-04-08 ENCOUNTER — Other Ambulatory Visit: Payer: Self-pay | Admitting: Internal Medicine

## 2016-04-08 DIAGNOSIS — Z1231 Encounter for screening mammogram for malignant neoplasm of breast: Secondary | ICD-10-CM

## 2016-04-22 MED FILL — PANTOPRAZOLE SOD DR 40 MG T: 40 | 90 days supply | Qty: 90 | Fill #1

## 2016-05-17 ENCOUNTER — Other Ambulatory Visit: Payer: 59 | Admitting: Internal Medicine

## 2016-05-17 DIAGNOSIS — Z130 Encounter for screening for diseases of the blood and blood-forming organs and certain disorders involving the immune mechanism: Secondary | ICD-10-CM

## 2016-05-17 DIAGNOSIS — Z1321 Encounter for screening for nutritional disorder: Secondary | ICD-10-CM

## 2016-05-17 DIAGNOSIS — Z1329 Encounter for screening for other suspected endocrine disorder: Secondary | ICD-10-CM

## 2016-05-17 DIAGNOSIS — Z0000 Encounter for general adult medical examination without abnormal findings: Secondary | ICD-10-CM

## 2016-05-17 DIAGNOSIS — E785 Hyperlipidemia, unspecified: Secondary | ICD-10-CM | POA: Diagnosis not present

## 2016-05-17 DIAGNOSIS — E118 Type 2 diabetes mellitus with unspecified complications: Secondary | ICD-10-CM | POA: Diagnosis not present

## 2016-05-17 LAB — CBC WITH DIFFERENTIAL/PLATELET
BASOS PCT: 0 %
Basophils Absolute: 0 cells/uL (ref 0–200)
EOS PCT: 9 %
Eosinophils Absolute: 414 cells/uL (ref 15–500)
HEMATOCRIT: 38.3 % (ref 35.0–45.0)
Hemoglobin: 12.1 g/dL (ref 11.7–15.5)
LYMPHS PCT: 28 %
Lymphs Abs: 1288 cells/uL (ref 850–3900)
MCH: 27.9 pg (ref 27.0–33.0)
MCHC: 31.6 g/dL — AB (ref 32.0–36.0)
MCV: 88.5 fL (ref 80.0–100.0)
MONO ABS: 322 {cells}/uL (ref 200–950)
MONOS PCT: 7 %
MPV: 9.2 fL (ref 7.5–12.5)
NEUTROS PCT: 56 %
Neutro Abs: 2576 cells/uL (ref 1500–7800)
PLATELETS: 322 10*3/uL (ref 140–400)
RBC: 4.33 MIL/uL (ref 3.80–5.10)
RDW: 14.9 % (ref 11.0–15.0)
WBC: 4.6 10*3/uL (ref 3.8–10.8)

## 2016-05-17 LAB — COMPLETE METABOLIC PANEL WITH GFR
ALT: 19 U/L (ref 6–29)
AST: 22 U/L (ref 10–35)
Albumin: 3.8 g/dL (ref 3.6–5.1)
Alkaline Phosphatase: 25 U/L — ABNORMAL LOW (ref 33–130)
BUN: 19 mg/dL (ref 7–25)
CALCIUM: 9.5 mg/dL (ref 8.6–10.4)
CHLORIDE: 106 mmol/L (ref 98–110)
CO2: 26 mmol/L (ref 20–31)
CREATININE: 0.93 mg/dL (ref 0.50–0.99)
GFR, Est African American: 76 mL/min (ref >=60)
GFR, Est Non African American: 66 mL/min (ref >=60)
Glucose, Bld: 87 mg/dL (ref 65–99)
POTASSIUM: 4.1 mmol/L (ref 3.5–5.3)
SODIUM: 141 mmol/L (ref 135–146)
Total Bilirubin: 0.4 mg/dL (ref 0.2–1.2)
Total Protein: 6.6 g/dL (ref 6.1–8.1)

## 2016-05-17 LAB — LIPID PANEL
CHOL/HDL RATIO: 2.4 ratio (ref <5.0)
CHOLESTEROL: 146 mg/dL (ref <200)
HDL: 62 mg/dL (ref >50)
LDL CALC: 67 mg/dL (ref <100)
TRIGLYCERIDES: 84 mg/dL (ref <150)
VLDL: 17 mg/dL (ref <30)

## 2016-05-17 LAB — TSH: TSH: 1.77 mIU/L

## 2016-05-18 LAB — HEMOGLOBIN A1C
Hgb A1c MFr Bld: 5.6 % (ref <5.7)
Mean Plasma Glucose: 114 mg/dL

## 2016-05-18 LAB — VITAMIN D 25 HYDROXY (VIT D DEFICIENCY, FRACTURES): VIT D 25 HYDROXY: 41 ng/mL (ref 30–100)

## 2016-05-24 ENCOUNTER — Ambulatory Visit (INDEPENDENT_AMBULATORY_CARE_PROVIDER_SITE_OTHER): Payer: 59 | Admitting: Internal Medicine

## 2016-05-24 ENCOUNTER — Encounter: Payer: Self-pay | Admitting: Internal Medicine

## 2016-05-24 VITALS — BP 158/90 | HR 75 | Temp 97.6°F | Ht 62.0 in | Wt 143.0 lb

## 2016-05-24 DIAGNOSIS — J069 Acute upper respiratory infection, unspecified: Secondary | ICD-10-CM | POA: Diagnosis not present

## 2016-05-24 DIAGNOSIS — Z853 Personal history of malignant neoplasm of breast: Secondary | ICD-10-CM

## 2016-05-24 DIAGNOSIS — J040 Acute laryngitis: Secondary | ICD-10-CM

## 2016-05-24 DIAGNOSIS — E8881 Metabolic syndrome: Secondary | ICD-10-CM | POA: Diagnosis not present

## 2016-05-24 DIAGNOSIS — Z8739 Personal history of other diseases of the musculoskeletal system and connective tissue: Secondary | ICD-10-CM

## 2016-05-24 DIAGNOSIS — E119 Type 2 diabetes mellitus without complications: Secondary | ICD-10-CM

## 2016-05-24 DIAGNOSIS — M797 Fibromyalgia: Secondary | ICD-10-CM | POA: Diagnosis not present

## 2016-05-24 DIAGNOSIS — K219 Gastro-esophageal reflux disease without esophagitis: Secondary | ICD-10-CM | POA: Diagnosis not present

## 2016-05-24 DIAGNOSIS — E7800 Pure hypercholesterolemia, unspecified: Secondary | ICD-10-CM | POA: Diagnosis not present

## 2016-05-24 DIAGNOSIS — F3289 Other specified depressive episodes: Secondary | ICD-10-CM | POA: Diagnosis not present

## 2016-05-24 DIAGNOSIS — I471 Supraventricular tachycardia, unspecified: Secondary | ICD-10-CM

## 2016-05-24 DIAGNOSIS — F5101 Primary insomnia: Secondary | ICD-10-CM | POA: Diagnosis not present

## 2016-05-24 DIAGNOSIS — Z0000 Encounter for general adult medical examination without abnormal findings: Secondary | ICD-10-CM

## 2016-05-24 LAB — POCT URINALYSIS DIPSTICK
Bilirubin, UA: NEGATIVE
Glucose, UA: NEGATIVE
KETONES UA: NEGATIVE
Leukocytes, UA: NEGATIVE
Nitrite, UA: NEGATIVE
PROTEIN UA: NEGATIVE
RBC UA: NEGATIVE
SPEC GRAV UA: 1.01
Urobilinogen, UA: NEGATIVE
pH, UA: 6.5

## 2016-05-24 MED ORDER — HYDROCOD POLST-CPM POLST ER 10-8 MG/5ML PO SUER: 5.0000 mL | Freq: Two times a day (BID) | ORAL | 0 refills | Status: DC | PRN

## 2016-05-24 MED ORDER — CEFTRIAXONE SODIUM 1 G IJ SOLR
1.0000 g | Freq: Once | INTRAMUSCULAR | Status: AC
  Administered 2016-05-24: 1 g via INTRAMUSCULAR

## 2016-05-24 MED ORDER — TRAMADOL HCL 50 MG PO TABS: 50.0000 mg | ORAL_TABLET | Freq: Three times a day (TID) | ORAL | 0 refills | Status: DC | PRN

## 2016-05-24 MED ORDER — LEVOFLOXACIN 500 MG PO TABS: 500.0000 mg | ORAL_TABLET | Freq: Every day | ORAL | 0 refills | Status: DC

## 2016-05-24 MED FILL — levoFLOXacin 500 MG TABS: 500 | 10 days supply | Qty: 10 | Fill #0

## 2016-05-24 MED FILL — HYDROCODONE-CHLORPHENIRAM S: 10-8 | 14 days supply | Qty: 140 | Fill #0

## 2016-05-24 MED FILL — traMADol HCL 50 MG TABS: 50 | 90 days supply | Qty: 270 | Fill #0

## 2016-05-24 NOTE — Progress Notes (Signed)
Subjective:    Patient ID: Jill Richardson, female    DOB: 03-03-1956, 60 y.o.   MRN: [id number]  HPI  60 year old Female for health maintenance exam and evaluation of medical issues.  She has history of low back pain with lumbar fusion L5-S1 in 2005 using the Gill procedure with cages and pedicle screws by Dr. Carloyn Manner. She has a history of breast cancer, controlled type 2 diabetes, hyperlipidemia, GE reflux, fibromyalgia, obesity, remote history of supraventricular tachycardia.  She is allergic to Lodine. 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 Able to tolerate Daypro for musculoskeletal pain  Left oophorectomy March 1997. Exploratory laparotomy for small bowel obstruction one month later.  Colonoscopy done in 2008  History of dysplastic nevus of the shoulder 2008  Long-standing history of fibromyalgia syndrome dating back to the 1990s.  Patient had invasive ductal carcinoma of the left breast diagnosed in January 2008. She had 2 pregnancies. No miscarriages. Menarche at age 38. Menopausal around age 76. She took or muscle replacement therapy until her cancer diagnosis. Tumor was ER/PR positive with evidence of lymphovascular invasion. Nodes were negative. She had radiation and lumpectomy. Radiation was completed April 2008. She was treated with tamoxifen and Femara.  She takes Crestor for hyperlipidemia. She has Flexeril for occasional back pain. She takes Ativan for insomnia. Occasionally takes tramadol for back pain. Occasionally takes Norco for back pain. History of GE reflux treated with PPI. Is on metformin and Januvia for diabetes mellitus with good control.  Social history: She is married. She has 2  grandchildren. Husband is disabled due to her chronic brain injury after an accident involving a tree falling on him in the mid 1990s. 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 2 adult  sons.  Had flu vaccine through employment.  Family history: Father with history of diabetes and hypertension died from competitions apart failure. Mother in good health. Brother with history of hypertension. Sister in good health.    She's come down with an upper respiratory infection. She is hoarse and congested. No fever or shaking chills. Ears feel stuffy and she's had some cough.    Review of Systems see above     Objective:   Physical Exam  Constitutional: She is oriented to person, place, and time. She appears well-developed and well-nourished. No distress.  HENT:  Head: Normocephalic and atraumatic.  Right Ear: External ear normal.  Left Ear: External ear normal.  Mouth/Throat: Oropharynx is clear and moist.  Eyes: Conjunctivae and EOM are normal. Pupils are equal, round, and reactive to light. Right eye exhibits no discharge. Left eye exhibits no discharge. No scleral icterus.  Neck: Neck supple. No JVD present. No thyromegaly present.  Cardiovascular: Normal rate, regular rhythm, normal heart sounds and intact distal pulses.   No murmur heard. Pulmonary/Chest: Effort normal and breath sounds normal. No respiratory distress. She has no wheezes. She has no rales.  Abdominal: Soft. Bowel sounds are normal. She exhibits no distension and no mass. There is no tenderness. There is no rebound and no guarding.  Genitourinary:  Genitourinary Comments: Deferred to Dr. Vanessa Kick.  Musculoskeletal: She exhibits no edema.  Lymphadenopathy:    She has no cervical adenopathy.  Neurological: She is alert and oriented to person, place, and time. She has normal reflexes. No cranial nerve deficit. Coordination normal.  Skin: No rash noted. She is not diaphoretic.  Psychiatric: She has a normal mood and affect. Her behavior is normal. Judgment and thought content normal.  Vitals reviewed.         Assessment & Plan:  Acute URI with laryngitis  History of fibromyalgia  syndrome  History of back pain with history of L5-S1 fusion 2005  Controlled type 2 diabetes mellitus-hemoglobin A1c stable  History of breast cancer  History of supraventricular tachycardia stable  with Lanoxin.  History of insomnia  History of depression  Hyperlipidemia  Obesity-continue diet and exercise  History of dysplastic nevus 2008  GE reflux  Metabolic syndrome  Plan: Lab work reviewed with her is within normal limits. Am pleased with diabetic control. Continue diet and exercise efforts. For respiratory infection, given 1 g IM ceftriaxone and Levaquin 500 milligrams daily for 10 days. Tussionex 1 teaspoon by mouth every 12 hours when necessary cough. Rest and drink plenty of fluids.  Return in 6 months.

## 2016-05-25 LAB — MICROALBUMIN, URINE: MICROALB UR: 0.7 mg/dL

## 2016-06-02 ENCOUNTER — Other Ambulatory Visit: Payer: Self-pay | Admitting: Internal Medicine

## 2016-06-02 MED ORDER — JANUVIA 100 MG PO TABS: 100.0000 mg | ORAL_TABLET | Freq: Every day | ORAL | 3 refills | Status: DC

## 2016-06-02 MED FILL — JANUVIA 100 MG TABLET: 100 | 90 days supply | Qty: 90 | Fill #0

## 2016-06-02 MED FILL — VENLAFAXINE HCL ER 37.5 MG: 37.5 | 90 days supply | Qty: 90 | Fill #2

## 2016-06-10 ENCOUNTER — Ambulatory Visit: Payer: 59 | Admitting: Pharmacist

## 2016-06-15 ENCOUNTER — Ambulatory Visit
Admission: RE | Admit: 2016-06-15 | Discharge: 2016-06-15 | Disposition: A | Discharge status: Home / Self Care | Payer: 59 | Source: Ambulatory Visit | Attending: Internal Medicine | Admitting: Internal Medicine

## 2016-06-15 DIAGNOSIS — Z1231 Encounter for screening mammogram for malignant neoplasm of breast: Secondary | ICD-10-CM | POA: Diagnosis not present

## 2016-07-05 ENCOUNTER — Other Ambulatory Visit: Payer: Self-pay | Admitting: Internal Medicine

## 2016-07-05 MED FILL — metFORMIN HCL 500 MG TABS: 500 | 90 days supply | Qty: 180 | Fill #0

## 2016-07-05 MED FILL — RAMIPRIL 2.5 MG CAPSULE: 2.5 | 90 days supply | Qty: 90 | Fill #0

## 2016-07-05 MED FILL — DIGITEK 250 MCG TABLET: 250 | 90 days supply | Qty: 90 | Fill #0

## 2016-07-05 MED FILL — ROSUVASTATIN CALCIUM 10 MG: 10 | 90 days supply | Qty: 90 | Fill #0

## 2016-07-05 MED FILL — FENOFIBRATE 160 MG TABLET: 160 | 90 days supply | Qty: 90 | Fill #0

## 2016-07-05 MED FILL — OXAPROZIN 600 MG TABLET: 600 | 90 days supply | Qty: 180 | Fill #1

## 2016-07-22 ENCOUNTER — Other Ambulatory Visit: Payer: Self-pay

## 2016-07-22 MED FILL — PANTOPRAZOLE SOD DR 40 MG T: 40 | 90 days supply | Qty: 90 | Fill #0

## 2016-08-09 ENCOUNTER — Other Ambulatory Visit: Payer: Self-pay

## 2016-08-09 ENCOUNTER — Other Ambulatory Visit: Payer: Self-pay | Admitting: Internal Medicine

## 2016-08-09 MED ORDER — LORAZEPAM 2 MG PO TABS: 2.0000 mg | ORAL_TABLET | Freq: Every day | ORAL | 1 refills | Status: DC

## 2016-08-09 MED FILL — LORazepam 2 MG TABS: 2 | 90 days supply | Qty: 90 | Fill #0

## 2016-08-09 NOTE — Telephone Encounter (Signed)
Refill x 6 months 

## 2016-09-01 MED FILL — JANUVIA 100 MG TABLET: 100 | 90 days supply | Qty: 90 | Fill #1

## 2016-09-01 MED FILL — VENLAFAXINE HCL ER 37.5 MG: 37.5 | 90 days supply | Qty: 90 | Fill #3

## 2016-09-09 ENCOUNTER — Ambulatory Visit (INDEPENDENT_AMBULATORY_CARE_PROVIDER_SITE_OTHER): Payer: 59 | Admitting: Internal Medicine

## 2016-09-09 ENCOUNTER — Encounter: Payer: Self-pay | Admitting: Internal Medicine

## 2016-09-09 VITALS — BP 140/88 | HR 92 | Ht 62.0 in | Wt 145.0 lb

## 2016-09-09 DIAGNOSIS — M5412 Radiculopathy, cervical region: Secondary | ICD-10-CM | POA: Diagnosis not present

## 2016-09-09 MED ORDER — PREDNISONE 10 MG PO TABS: ORAL_TABLET | ORAL | 0 refills | Status: DC

## 2016-09-09 MED FILL — predniSONE 10 MG TABS: 10 | 6 days supply | Qty: 21 | Fill #0

## 2016-09-09 NOTE — Progress Notes (Signed)
   Subjective:    Patient ID: Jill Richardson, female    DOB: 28-Oct-1955, 61 y.o.   MRN: [id number]  HPI For about a month, has been experiencing tingling and numbness in her left neck and shoulder. Has not had issues with dropping objects. Does not think arm is weak. Considerable uncomfortableness at night making it difficult to sleep. There is actually neck pain. This is in the left lateral neck area.  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 She's tried to minimize that recently.  She is going to accept a new job with [employer] soon.    Review of Systems see above     Objective:   Physical Exam Deep tendon reflexes in the left upper extremity are normal. Muscle strength in the left upper extremity 5 over 5. There is some tenderness to deep palpation left sternocleidomastoid muscle area.        Assessment & Plan:  Cervical radiculopathy  Plan: Sterapred DS 10 mg 6 day Dosepak. She is a diabetic and we'll have to watch her Accu-Cheks closely. We'll schedule an MRI of the C-spine in the near future. She has prescriptions for tramadol and Flexeril.

## 2016-09-11 NOTE — Patient Instructions (Addendum)
Take prednisone as directed. To have MRI of the C-spine in the near future. Continue tramadol and Flexeril as needed.

## 2016-09-20 ENCOUNTER — Telehealth: Payer: Self-pay

## 2016-09-20 DIAGNOSIS — M25519 Pain in unspecified shoulder: Secondary | ICD-10-CM

## 2016-09-20 DIAGNOSIS — M542 Cervicalgia: Principal | ICD-10-CM

## 2016-09-20 NOTE — Telephone Encounter (Signed)
Patient called she was here on 09/09/16. She said her neck, L shoulder and the back of her head are back to hurting she said that you advised her if not better to call back. Please advise?

## 2016-09-20 NOTE — Telephone Encounter (Signed)
I will order the MRI.

## 2016-09-20 NOTE — Telephone Encounter (Signed)
Needs to get MRI of cervical spine. Get Sharyn Lull to help you arrange.

## 2016-10-01 NOTE — Telephone Encounter (Signed)
Patient has been scheduled and there is no authorization for the MRI.

## 2016-10-05 MED FILL — ROSUVASTATIN CALCIUM 10 MG: 10 | 90 days supply | Qty: 90 | Fill #1

## 2016-10-05 MED FILL — FENOFIBRATE 160 MG TABLET: 160 | 90 days supply | Qty: 90 | Fill #1

## 2016-10-05 MED FILL — metFORMIN HCL 500 MG TABS: 500 | 90 days supply | Qty: 180 | Fill #1

## 2016-10-05 MED FILL — RAMIPRIL 2.5 MG CAPSULE: 2.5 | 90 days supply | Qty: 90 | Fill #1

## 2016-10-06 ENCOUNTER — Telehealth: Payer: Self-pay | Admitting: Internal Medicine

## 2016-10-06 DIAGNOSIS — M542 Cervicalgia: Secondary | ICD-10-CM

## 2016-10-06 NOTE — Telephone Encounter (Signed)
Clarify order for MRI of the C-spine without contrast for cervical radiculopathy.

## 2016-10-06 NOTE — Telephone Encounter (Signed)
Spoke with Ashton @ [phone]; MRA has been changed to MRI of the neck.  Patient will have MRI of the neck per the original request.  Dx:  Neck and shoulder pain.  No authorization required by her insurance company for the MRI.  Patient schedulr for Saturday, 10/09/16.  Patient has been notified.

## 2016-10-09 ENCOUNTER — Other Ambulatory Visit: Payer: 59

## 2016-10-09 ENCOUNTER — Inpatient Hospital Stay: Admission: RE | Admit: 2016-10-09 | Payer: 59 | Source: Ambulatory Visit

## 2016-10-09 ENCOUNTER — Ambulatory Visit
Admission: RE | Admit: 2016-10-09 | Discharge: 2016-10-09 | Disposition: A | Discharge status: Home / Self Care | Payer: 59 | Source: Ambulatory Visit | Attending: Internal Medicine | Admitting: Internal Medicine

## 2016-10-09 DIAGNOSIS — M4802 Spinal stenosis, cervical region: Secondary | ICD-10-CM | POA: Diagnosis not present

## 2016-10-11 ENCOUNTER — Other Ambulatory Visit: Payer: Self-pay

## 2016-10-11 MED ORDER — GLUCOSE BLOOD VI STRP: ORAL_STRIP | 12 refills | Status: DC

## 2016-10-11 MED FILL — DIGITEK 250 MCG TABLET: 250 | 90 days supply | Qty: 90 | Fill #1

## 2016-10-11 MED FILL — ACCU-CHEK GUIDE TEST STRIP: 50 days supply | Qty: 100 | Fill #0

## 2016-10-14 ENCOUNTER — Encounter: Payer: Self-pay | Admitting: Internal Medicine

## 2016-10-14 ENCOUNTER — Ambulatory Visit (INDEPENDENT_AMBULATORY_CARE_PROVIDER_SITE_OTHER): Payer: 59 | Admitting: Internal Medicine

## 2016-10-14 VITALS — BP 140/84 | HR 78 | Temp 97.5°F | Wt 145.0 lb

## 2016-10-14 DIAGNOSIS — M5412 Radiculopathy, cervical region: Secondary | ICD-10-CM | POA: Diagnosis not present

## 2016-10-14 DIAGNOSIS — F439 Reaction to severe stress, unspecified: Secondary | ICD-10-CM | POA: Diagnosis not present

## 2016-10-14 DIAGNOSIS — M5090 Cervical disc disorder, unspecified, unspecified cervical region: Secondary | ICD-10-CM

## 2016-10-14 MED ORDER — PREDNISONE 10 MG PO TABS: ORAL_TABLET | ORAL | 0 refills | Status: DC

## 2016-10-14 MED FILL — predniSONE 10 MG TABS: 10 | 12 days supply | Qty: 42 | Fill #0

## 2016-10-14 NOTE — Progress Notes (Signed)
   Subjective:    Patient ID: Jill Richardson, female    DOB: July 29, 1955, 61 y.o.   MRN: [id number]  HPI  she is here to follow-up today on neck pain. She had a recent MRI of the LS-spine. Seems to have multilevel issues. 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 Type I degenerative endplate findings at E5-I7 associated with left facet edema. She has prominent bilateral foraminal stenosis and mild central airway of the thecal sac due to disc bulge, uncinate spurring and facet arthropathy.  C4-5 prominent right and moderate left foraminal stenosis and moderate central airway of the thecal sac due to uncinate spurring. Has dyspnea bulge facet arthropathy and central disc protrusion. At C5-C6 prominent bilateral foraminal stenosis and moderate central narrowing of the thecal sac due to posterior osseous ridging, disc bulge, uncinate spurring and right lateral recess disc protrusion. C6-C7 moderate right and mild left foraminal stenosis and mild central narrowing of the thecal sac due to right paracentral disc protrusion disc bulge and uncinate spurring. Shallow left paracentral disc protrusion C7-T1.  Left arm is still bothering her down to her left elbow. Her activities as changed. She is now working in the [employer]. 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 Has had to learn how to position her head to view several screens. I have given her a soft cervical collar today as he of that will help her neck and upper back pain.  She says hydrocodone keeps her up at night so she doesn't want a refill on that. She has tramadol.  The steroids did help considerably but pain came back what she was off the steroid taper.   Review of Systems see above. Has situational stress at home with her youngest son who is unemployed   Objective:   Physical Exam Left grip slightly weaker than right grip. Muscle strength in the left upper extremity appears to be normal otherwise.        Assessment &  Plan:   Left cervical radiculopathy.Multilevel disc disease.  Plan: Referred to Neurosurgeon for evaluation. Given her this time a 12 day Sterapred DS 10 mg taper.

## 2016-10-14 NOTE — Patient Instructions (Addendum)
Referred to Neurosurgeon for evaluation. Given 12 day 10 mg Sterapred DS tapering dose pack.[id number]

## 2016-10-22 MED FILL — PANTOPRAZOLE SOD DR 40 MG T: 40 | 90 days supply | Qty: 90 | Fill #1

## 2016-10-29 DIAGNOSIS — D351 Benign neoplasm of parathyroid gland: Secondary | ICD-10-CM | POA: Diagnosis not present

## 2016-11-15 ENCOUNTER — Encounter: Payer: Self-pay | Admitting: Adult Health

## 2016-11-15 ENCOUNTER — Ambulatory Visit (HOSPITAL_BASED_OUTPATIENT_CLINIC_OR_DEPARTMENT_OTHER): Payer: 59 | Admitting: Adult Health

## 2016-11-15 VITALS — BP 145/84 | HR 75 | Temp 98.3°F | Resp 19 | Ht 62.0 in | Wt 147.2 lb

## 2016-11-15 DIAGNOSIS — I890 Lymphedema, not elsewhere classified: Secondary | ICD-10-CM | POA: Diagnosis not present

## 2016-11-15 DIAGNOSIS — R232 Flushing: Secondary | ICD-10-CM

## 2016-11-15 DIAGNOSIS — Z1239 Encounter for other screening for malignant neoplasm of breast: Secondary | ICD-10-CM

## 2016-11-15 DIAGNOSIS — E2839 Other primary ovarian failure: Secondary | ICD-10-CM | POA: Diagnosis not present

## 2016-11-15 DIAGNOSIS — Z853 Personal history of malignant neoplasm of breast: Secondary | ICD-10-CM | POA: Diagnosis not present

## 2016-11-15 DIAGNOSIS — Z170 Estrogen receptor positive status [ER+]: Secondary | ICD-10-CM

## 2016-11-15 DIAGNOSIS — C50412 Malignant neoplasm of upper-outer quadrant of left female breast: Secondary | ICD-10-CM

## 2016-11-15 MED ORDER — VENLAFAXINE HCL ER 37.5 MG PO CP24: 37.5000 mg | ORAL_CAPSULE | Freq: Every day | ORAL | 3 refills | Status: DC

## 2016-11-15 NOTE — Progress Notes (Signed)
CLINIC:  Survivorship   REASON FOR VISIT:  Routine follow-up for history of breast cancer.   BRIEF ONCOLOGIC HISTORY:    Breast cancer of upper-outer quadrant of left female breast ([place])   07/05/2006 Surgery    Left breast lumpectomy: 1.4cm grade 2 lesion of invasive ductal carcinoma. ER 100%, PR 100%, HER-2/neu negative Ki-67 of 25%. 0/2 SLN      08/30/2006 - 10/27/2006 Radiation Therapy    Adjuvant radiation therapy      11/21/2006 - 07/25/2013 Anti-estrogen oral therapy    Tamoxifen 2 years then changed to Femara 6 years        INTERVAL HISTORY:  Jill Richardson presents to the [facility] today for routine follow-up for her history of breast cancer.  Overall, she reports feeling quite well. She is up to date with her screenings and follows with her pcp at least annually.  She does have some swelling in her left arm that has been going on for a few months.  Otherwise, she is well and without questions or concerns.      REVIEW OF SYSTEMS:  Review of Systems  Constitutional: Negative for appetite change, chills, diaphoresis, fatigue, fever and unexpected weight change.  HENT:   Negative for hearing loss and lump/mass.   Eyes: Negative for eye problems and icterus.  Respiratory: Negative for chest tightness, cough and shortness of breath.   Cardiovascular: Negative for chest pain, leg swelling and palpitations.  Gastrointestinal: Negative for abdominal distention, constipation and diarrhea.  Endocrine: Negative for hot flashes.  Skin: Negative for itching and rash.  Neurological: Negative for dizziness, extremity weakness and headaches.  Hematological: Negative for adenopathy. Does not bruise/bleed easily.  Breast: Denies any new nodularity, masses, tenderness, nipple changes, or nipple discharge.      PAST MEDICAL/SURGICAL HISTORY:  Past Medical History:  Diagnosis Date  . Breast cancer ([place]) 2008  . Fibromyalgia   . GERD (gastroesophageal reflux disease)   .  Hyperlipidemia   . S/P lumbar fusion 2005   L5-S1  . SVT (supraventricular tachycardia) ([facility])    Past Surgical History:  Procedure Laterality Date  . BREAST LUMPECTOMY  2008   LEFT  . LUMBAR FUSION  2005  . OVARY REMOVED  1997     ALLERGIES:  Allergies  Allergen Reactions  . Lipitor [Atorvastatin Calcium] Other (See Comments)    NURALISHIA  . Lodine [Etodolac] Other (See Comments)    STEVENS JOHNSONS      CURRENT MEDICATIONS:  Outpatient Encounter Prescriptions as of 11/15/2016  Medication Sig  . calcium-vitamin D (OSCAL WITH D) 500-200 MG-UNIT per tablet Take 1 tablet by mouth 2 (two) times daily.  . cyclobenzaprine (FLEXERIL) 10 MG tablet One po qhs  . DIGITEK 250 MCG tablet TAKE 1 TABLET BY MOUTH ONCE DAILY  . fenofibrate 160 MG tablet TAKE 1 TABLET BY MOUTH ONCE DAILY  . glucosamine-chondroitin 500-400 MG tablet Take 1 tablet by mouth daily.  Marland Kitchen glucose blood (ACCU-CHEK GUIDE) test strip Use BID, Use as instructed  . JANUVIA 100 MG tablet Take 1 tablet (100 mg total) by mouth daily.  Marland Kitchen LORazepam (ATIVAN) 2 MG tablet Take 1 tablet (2 mg total) by mouth at bedtime.  . metFORMIN (GLUCOPHAGE) 500 MG tablet TAKE 1 TABLET BY MOUTH TWICE DAILY  . Multiple Vitamin (MULTIVITAMIN) capsule Take 1 capsule by mouth daily.    Marland Kitchen oxaprozin (DAYPRO) 600 MG tablet TAKE 2 TABLETS BY MOUTH DAILY  . pantoprazole (PROTONIX) 40 MG tablet TAKE 1 TABLET  BY MOUTH ONCE DAILY  . predniSONE (DELTASONE) 10 MG tablet Take in tapering course as directed.  . ramipril (ALTACE) 2.5 MG capsule TAKE 1 CAPSULE BY MOUTH ONCE DAILY  . rosuvastatin (CRESTOR) 10 MG tablet TAKE 1 TABLET BY MOUTH ONCE DAILY.  . traMADol (ULTRAM) 50 MG tablet Take 1 tablet (50 mg total) by mouth every 8 (eight) hours as needed.  . venlafaxine XR (EFFEXOR-XR) 37.5 MG 24 hr capsule Take 1 capsule (37.5 mg total) by mouth daily.   No facility-administered encounter medications on file as of 11/15/2016.      ONCOLOGIC FAMILY  HISTORY:  Family History  Problem Relation Age of Onset  . Diabetes Sister   . Hypertension Brother   . Hypertension Mother   . Diabetes Father   . Hypertension Father        pulmonary HTN  . Cancer Father   . Kidney disease Father     GENETIC COUNSELING/TESTING: Not indicated  SOCIAL HISTORY:  Jill Richardson is married and lives with her husbands and one of her sons in [place], [place].  She has 2 children and they live nearby.  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  She denies any current or history of tobacco, alcohol, or illicit drug use.     PHYSICAL EXAMINATION:  Vital Signs: Vitals:   11/15/16 1521  BP: (!) 145/84  Pulse: 75  Resp: 19  Temp: 98.3 F (36.8 C)   Filed Weights   11/15/16 1521  Weight: 147 lb 3.2 oz (66.8 kg)   General: Well-nourished, well-appearing female in no acute distress.  Unaccompanied today.   HEENT: Head is normocephalic.  Pupils equal and reactive to light. Conjunctivae clear without exudate.  Sclerae anicteric. Oral mucosa is pink, moist.  Oropharynx is pink without lesions or erythema.  Lymph: No cervical, supraclavicular, or infraclavicular lymphadenopathy noted on palpation.  Cardiovascular: Regular rate and rhythm.Marland Kitchen Respiratory: Clear to auscultation bilaterally. Chest expansion symmetric; breathing non-labored.  Breast Exam:  -Left breast: No appreciable masses on palpation. No skin redness, thickening, or peau d'orange appearance; no nipple retraction or nipple discharge; mild distortion in symmetry at previous lumpectomy site well healed scar without erythema or nodularity.  -Right breast: No appreciable masses on palpation. No skin redness, thickening, or peau d'orange appearance; no nipple retraction or nipple discharge;. -Axilla: No axillary adenopathy bilaterally.  GI: Abdomen soft and round; non-tender, non-distended. Bowel sounds normoactive. No hepatosplenomegaly.   GU: Deferred.    Neuro: No focal deficits. Steady gait.  Psych: Mood and affect normal and appropriate for situation.  Extremities: No edema. Skin: Warm and dry.  LABORATORY DATA:  None for this visit   DIAGNOSTIC IMAGING:  Most recent mammogram:      ASSESSMENT AND PLAN:  Jill Richardson is a pleasant 61 y.o. 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  She [facility] to the [facility] for surveillance and routine follow-up.   1. History of breast cancer:  Jill Richardson is currently clinically and radiographically without evidence of disease or recurrence of breast cancer. She will be due for mammogram in 05/2017; orders placed today.    Since it is 10 years since her breast cancer diagnosis, Jill Richardson graduated today from seeing [place].  She has no evidence of recurrence.  She knows how to get in touch with [place] if she  needs [place].    2. Lymphedema: Patient has left arm swelling, I prescribed a class II sleeve and glove.  I talked about physical therapy with her.  She knows to call if she decides to go to physical therapy for her lymphedema.    3. Bone health:  Given Jill Richardson's age, history of breast cancer, and her previous anti estrogen therapy with Letrozole, she is at risk for bone demineralization. Her last DEXA scan was 5 years ago.  I have ordered a repeat in December when her mammogram is due.  In the meantime, she was encouraged to increase her consumption of foods rich in calcium, as well as increase her weight-bearing activities.  She was given education on specific food and activities to promote bone health.  4. Cancer screening:  Due to Jill Richardson's history and her age, she should receive screening for skin cancers, colon cancer, and gynecologic cancers. She was encouraged to follow-up with her PCP for appropriate cancer screenings.    5. Health maintenance and wellness promotion: Jill Richardson was encouraged to consume 5-7 servings of fruits and vegetables per day. She was also encouraged to engage in moderate to vigorous exercise for 30 minutes per day most days of the week. She was instructed to limit her alcohol consumption and continue to abstain from tobacco use.    Dispo:  -Return to cancer center PRN -mammogram and bone density in 05/2017 when due   A total of (30) minutes of face-to-face time was spent with this patient with greater than 50% of that time in counseling and care-coordination.   Gardenia Phlegm, NP Survivorship Program [facility] [phone]   Note: PRIMARY CARE PROVIDER Elby Showers, [place] [phone]

## 2016-11-18 ENCOUNTER — Encounter: Payer: 59 | Admitting: Nurse Practitioner

## 2016-11-29 ENCOUNTER — Other Ambulatory Visit: Payer: 59 | Admitting: Internal Medicine

## 2016-11-29 MED FILL — JANUVIA 100 MG TABLET: 100 | 90 days supply | Qty: 90 | Fill #2

## 2016-11-29 MED FILL — VENLAFAXINE HCL ER 37.5 MG: 37.5 | 90 days supply | Qty: 90 | Fill #0

## 2016-12-06 ENCOUNTER — Ambulatory Visit: Payer: 59 | Admitting: Internal Medicine

## 2016-12-10 ENCOUNTER — Other Ambulatory Visit: Payer: Self-pay | Admitting: Internal Medicine

## 2016-12-15 DIAGNOSIS — M5021 Other cervical disc displacement,  high cervical region: Secondary | ICD-10-CM | POA: Diagnosis not present

## 2016-12-15 DIAGNOSIS — M4802 Spinal stenosis, cervical region: Secondary | ICD-10-CM | POA: Diagnosis not present

## 2016-12-15 DIAGNOSIS — M542 Cervicalgia: Secondary | ICD-10-CM | POA: Diagnosis not present

## 2016-12-15 DIAGNOSIS — M5412 Radiculopathy, cervical region: Secondary | ICD-10-CM | POA: Diagnosis not present

## 2016-12-15 DIAGNOSIS — Q761 Klippel-Feil syndrome: Secondary | ICD-10-CM | POA: Diagnosis not present

## 2016-12-17 ENCOUNTER — Other Ambulatory Visit: Payer: 59 | Admitting: Internal Medicine

## 2016-12-17 DIAGNOSIS — E118 Type 2 diabetes mellitus with unspecified complications: Secondary | ICD-10-CM

## 2016-12-17 DIAGNOSIS — E785 Hyperlipidemia, unspecified: Secondary | ICD-10-CM

## 2016-12-17 LAB — HEPATIC FUNCTION PANEL
ALBUMIN: 4.2 g/dL (ref 3.6–5.1)
ALK PHOS: 28 U/L — AB (ref 33–130)
ALT: 24 U/L (ref 6–29)
AST: 25 U/L (ref 10–35)
BILIRUBIN TOTAL: 0.4 mg/dL (ref 0.2–1.2)
Bilirubin, Direct: 0.1 mg/dL (ref <=0.2)
Indirect Bilirubin: 0.3 mg/dL (ref 0.2–1.2)
TOTAL PROTEIN: 7.2 g/dL (ref 6.1–8.1)

## 2016-12-17 LAB — LIPID PANEL
CHOL/HDL RATIO: 3.6 ratio (ref <5.0)
Cholesterol: 174 mg/dL (ref <200)
HDL: 48 mg/dL — ABNORMAL LOW (ref >50)
LDL Cholesterol: 96 mg/dL (ref <100)
Triglycerides: 151 mg/dL — ABNORMAL HIGH (ref <150)
VLDL: 30 mg/dL (ref <30)

## 2016-12-18 LAB — HEMOGLOBIN A1C
HEMOGLOBIN A1C: 5.8 % — AB (ref <5.7)
MEAN PLASMA GLUCOSE: 120 mg/dL

## 2016-12-20 ENCOUNTER — Encounter: Payer: Self-pay | Admitting: Internal Medicine

## 2016-12-20 ENCOUNTER — Ambulatory Visit (INDEPENDENT_AMBULATORY_CARE_PROVIDER_SITE_OTHER): Payer: 59 | Admitting: Internal Medicine

## 2016-12-20 VITALS — BP 130/80 | HR 74 | Temp 97.8°F | Wt 146.0 lb

## 2016-12-20 DIAGNOSIS — E119 Type 2 diabetes mellitus without complications: Secondary | ICD-10-CM | POA: Diagnosis not present

## 2016-12-20 DIAGNOSIS — M5090 Cervical disc disorder, unspecified, unspecified cervical region: Secondary | ICD-10-CM | POA: Diagnosis not present

## 2016-12-20 DIAGNOSIS — E7800 Pure hypercholesterolemia, unspecified: Secondary | ICD-10-CM

## 2016-12-20 NOTE — Progress Notes (Signed)
   Subjective:    Patient ID: Jill Richardson, female    DOB: Nov 25, 1955, 61 y.o.   MRN: [id number]  HPI  61 year old Female  for 6 month follow up. Neck pain has improved. Saw Dr. Vertell Limber recently who recommended PT.Does not recommend surgery for cervical disc disease at the present time.   History of controlled type 2 diabetes mellitus without complication, fibromyalgia syndrome, hyperlipidemia, GE reflux, history of breast cancer, remote history of supraventricular tachycardia.  Hgb AIC is 5.8%.   Has transitioned to new job with [employer]. 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  History of invasive ductal carcinoma of the left breast 2008.  Takes Crestor for hyperlipidemia. Lipid panel and liver functions reviewed and are within normal limits.  Takes Ativan for insomnia. Expect he for GE reflux.  Is on metformin and Januvia for diabetes mellitus with excellent control.      Review of Systems see above     Objective:   Physical Exam  Constitutional: She appears well-developed and well-nourished.  Neck: No JVD present. No thyromegaly present.  Cardiovascular: Normal rate, regular rhythm and normal heart sounds.   Pulmonary/Chest: Effort normal and breath sounds normal. No respiratory distress. She has no wheezes.  Musculoskeletal: She exhibits no edema.  Lymphadenopathy:    She has no cervical adenopathy.  Skin: Skin is warm and dry.  Psychiatric: She has a normal mood and affect. Her behavior is normal. Judgment and thought content normal.  Vitals reviewed.         Assessment & Plan:  Controlled type 2 diabetes mellitus-stable. Hemoglobin A1c excellent at 5.8%  Hyperlipidemia-lipid panel and liver functions reviewed. Triglycerides 151. Total cholesterol 174. LDL cholesterol 96.  Cervical disc disease with neck pain and radiculopathy  History of breast cancer  Fibromyalgia syndrome-stable  History of  insomnia  History of depression  Metabolic syndrome  GE reflux  Plan: Continue same medications. Try physical therapy for an cervical disc disease and neck pain. Return here in 6 months. Continue diet exercise and weight loss efforts.

## 2016-12-20 NOTE — Patient Instructions (Signed)
It was a pleasure to see you today. Try physical therapy for neck pain. Continue same medications. Continue to work on diet exercise and weight loss. Return in 6 months for physical exam.

## 2016-12-27 MED FILL — LORazepam 2 MG TABS: 2 | 90 days supply | Qty: 90 | Fill #1

## 2017-01-10 ENCOUNTER — Other Ambulatory Visit: Payer: Self-pay | Admitting: Internal Medicine

## 2017-01-10 MED FILL — ROSUVASTATIN CALCIUM 10 MG: 10 | 90 days supply | Qty: 90 | Fill #0

## 2017-01-10 MED FILL — RAMIPRIL 2.5 MG CAPSULE: 2.5 | 90 days supply | Qty: 90 | Fill #0

## 2017-01-10 MED FILL — FENOFIBRATE 160 MG TABLET: 160 | 90 days supply | Qty: 90 | Fill #0

## 2017-01-10 MED FILL — metFORMIN HCL 500 MG TABS: 500 | 90 days supply | Qty: 180 | Fill #2

## 2017-01-10 MED FILL — OXAPROZIN 600 MG TABLET: 600 | 90 days supply | Qty: 180 | Fill #0

## 2017-01-10 NOTE — Telephone Encounter (Signed)
Refill all x 6 months 

## 2017-01-19 ENCOUNTER — Ambulatory Visit: Payer: 59 | Attending: Neurosurgery | Admitting: Physical Therapy

## 2017-01-19 ENCOUNTER — Other Ambulatory Visit: Payer: Self-pay | Admitting: Internal Medicine

## 2017-01-19 DIAGNOSIS — M542 Cervicalgia: Secondary | ICD-10-CM | POA: Insufficient documentation

## 2017-01-19 DIAGNOSIS — M79622 Pain in left upper arm: Secondary | ICD-10-CM | POA: Insufficient documentation

## 2017-01-19 MED FILL — DIGITEK 250 MCG TABLET: 250 | 90 days supply | Qty: 90 | Fill #0

## 2017-01-19 MED FILL — PANTOPRAZOLE SOD DR 40 MG T: 40 | 90 days supply | Qty: 90 | Fill #2

## 2017-01-19 NOTE — Therapy (Signed)
[place], [place], [address] Phone: [phone]   Fax:  [phone]  Physical Therapy Evaluation  Patient Details  Name: Jill Richardson MRN: [id number] Date of Birth: 01-31-1956 Referring Provider: Erline Levine, MD  Encounter Date: 01/19/2017      PT End of Session - 01/19/17 1632    Visit Number 1   Number of Visits 9   Date for PT Re-Evaluation 03/18/17   Authorization Type [facility]   PT Start Time 1632   PT Stop Time 1712   PT Time Calculation (min) 40 min   Activity Tolerance Patient tolerated treatment well   Behavior During Therapy [facility] for tasks assessed/performed      Past Medical History:  Diagnosis Date  . Breast cancer ([place]) 2008  . Fibromyalgia   . GERD (gastroesophageal reflux disease)   . Hyperlipidemia   . S/P lumbar fusion 2005   L5-S1  . SVT (supraventricular tachycardia) (HCC)     Past Surgical History:  Procedure Laterality Date  . BREAST LUMPECTOMY  2008   LEFT  . LUMBAR FUSION  2005  . OVARY REMOVED  1997    There were no vitals filed for this visit.       Subjective Assessment - 01/19/17 1635    Subjective Began in April- pain on L side of neck that went down to elbow and into chest. Did a steroid pack which resolved pain but pain returned when medications were ended. Pain got a little better on its own, intermittent pain by the time she got to Dr Vertell Limber. Insidious onsets of pain.    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 Yes   Pain Score 3    Pain Location Neck   Pain Orientation Left   Pain Descriptors / Indicators Shooting;Sharp   Pain Type Chronic pain   Aggravating Factors  turning L   Pain Relieving Factors change postions            [facility] PT Assessment - 01/19/17 0001      Assessment   Medical Diagnosis cervical radiculopathy   Referring Provider Erline Levine, MD   Onset Date/Surgical Date --  April 2018   Hand Dominance Right   Prior Therapy no     Precautions   Precautions None     Restrictions   Weight Bearing Restrictions No     Balance Screen   Has the patient fallen in the past 6 months No     [place] residence   Living Arrangements Spouse/significant other;Children   Additional Comments stairs to basement     Prior Function   Level of Independence Independent   Vocation Full time employment   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     Cognition   Overall Cognitive Status Within Functional Limits for tasks assessed     Observation/Other Assessments   Focus on Therapeutic Outcomes (FOTO)  41% limitation     Sensation   Additional Comments pain to L elbow     Posture/Postural Control   Posture Comments tends to sit leaning to R side of chair-L cervical sidebend     ROM / Strength   AROM / PROM / Strength AROM;Strength     AROM   AROM Assessment Site Cervical   Cervical Flexion 30   Cervical Extension 20  Cervical - Right Side Bend 20  pulling sensation   Cervical - Left Side Bend 30   Cervical - Right Rotation 43   Cervical - Left Rotation 41     Strength   Overall Strength Comments WFL     Palpation   Palpation comment tightness noted in L SCM & scalenes, L scapular winging            Objective measurements completed on examination: See above findings.          [facility] [facility] Adult PT Treatment/Exercise - 01/19/17 0001      Exercises   Exercises Neck     Neck Exercises: Standing   Other Standing Exercises scapular retraction standing and seated     Neck Exercises: Seated   Neck Retraction Limitations cervical retraction +scapular retraction     Neck Exercises: Stretches   Neck Stretch Limitations SCM, scalenes                PT Education - 01/19/17 1726    Education provided Yes   Education  Details anatomy of condition, POC, HEP, exercise form/rationale   Person(s) Educated Patient   Methods Explanation;Demonstration;Tactile cues;Verbal cues;Handout   Comprehension Verbalized understanding;Returned demonstration;Verbal cues required;Tactile cues required;Need further instruction          PT Short Term Goals - 01/19/17 1721      PT SHORT TERM GOAL #1   Title Pt will demo resting posture with head at midline   Baseline resting with L cervical sidebend   Time 4   Period Weeks   Status New   Target Date 02/18/17           PT Long Term Goals - 01/19/17 1722      PT LONG TERM GOAL #1   Title Pt will report a series of at least 3 days without neck pain    Baseline daily, insidious onset, sometimes multiple in a day   Time 8   Period Weeks   Status New   Target Date 03/18/17     PT LONG TERM GOAL #2   Title 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 without limitation from cervical region   Baseline limited at eval   Time 8   Period Weeks   Status New   Target Date 03/18/17     PT LONG TERM GOAL #3   Title FOTO to 32% limitation to demo significant improvement in functional ability   Baseline 41% limited at eval   Time 8   Period Weeks   Status New   Target Date 03/18/17     PT LONG TERM GOAL #4   Title Pt will be able to complete household chores and garden work without onset of neck pain   Baseline pain at eval   Time 8   Period Weeks   Status New   Target Date 03/18/17                Plan - 01/19/17 1715    Clinical Impression Statement Pt presents to PT with complaints of L cervical neck pain that is intermittent and will refer to L side of chest and L elbow. Strength is WFL. notable scapular winging and resting L cervical sidebend. Notable tightness in L cervical musculature resutling in L-sided cervical compression. Pt will benefit from skilled PT to improve postural alignment in functional activities to decrease  pain and meet long term functional goals.    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   Clinical Presentation Stable   Clinical Presentation due to: n/a   Clinical Decision Making Low   Rehab Potential Good   PT Frequency 1x / week  1/week requested by pt due to work schedule   PT Duration 8 weeks   PT Treatment/Interventions ADLs/Self Care Home Management;Cryotherapy;Electrical Stimulation;Iontophoresis 4mg /ml Dexamethasone;Functional mobility training;Ultrasound;Moist Heat;Therapeutic activities;Therapeutic exercise;Neuromuscular re-education;Patient/family education;Passive range of motion;Manual techniques;Dry needling;Taping   PT Next Visit Plan pectoralis stretching, periscapular endurance, thoracic mobility   PT Home Exercise Plan cervical & scapular retraction, SCM stretch, scalene stretch   Consulted and Agree with Plan of Care Patient      Patient will benefit from skilled therapeutic intervention in order to improve the following deficits and impairments:  Decreased range of motion, Increased muscle spasms, Decreased activity tolerance, Pain, Improper body mechanics, Impaired flexibility, Postural dysfunction  Visit Diagnosis: Cervicalgia - Plan: PT plan of care cert/re-cert  Pain in left upper arm - Plan: PT plan of care cert/re-cert     Problem List Patient Active Problem List   Diagnosis Date Noted  . Depression 08/22/2012  . Insomnia 08/22/2012  . Metabolic syndrome 95/18/8416  . Type 2 diabetes mellitus ([place]) 11/28/2011  . Hyperlipidemia 12/23/2010  . GE reflux 12/23/2010  . Fibromyalgia 12/23/2010  . Breast cancer of upper-outer quadrant of left female breast ([place]) 12/23/2010  . History of supraventricular tachycardia 12/23/2010   Kingsly Kloepfer C. Devyne Hauger PT, DPT 01/19/17 5:29 PM   [facility] [address] [place],  [place], [address] Phone: [phone]   Fax:  [phone]  Name: Jill Richardson MRN: [id number] Date of Birth: Mar 06, 1956

## 2017-01-26 ENCOUNTER — Ambulatory Visit: Payer: 59 | Attending: Neurosurgery | Admitting: Physical Therapy

## 2017-01-26 ENCOUNTER — Encounter: Payer: Self-pay | Admitting: Physical Therapy

## 2017-01-26 DIAGNOSIS — M79622 Pain in left upper arm: Secondary | ICD-10-CM | POA: Diagnosis not present

## 2017-01-26 DIAGNOSIS — M542 Cervicalgia: Secondary | ICD-10-CM | POA: Diagnosis not present

## 2017-01-26 NOTE — Therapy (Signed)
[place], [place], [address] Phone: [phone]   Fax:  [phone]  Physical Therapy Treatment  Patient Details  Name: Jill Richardson MRN: [id number] Date of Birth: [birth year] Referring Provider: Erline Levine, MD  Encounter Date: 01/26/2017      PT End of Session - 01/26/17 1738    Visit Number 2   Number of Visits 9   Date for PT Re-Evaluation 03/18/17   PT Start Time 2774   PT Stop Time 1720   PT Time Calculation (min) 44 min   Activity Tolerance Patient tolerated treatment well   Behavior During Therapy [facility] for tasks assessed/performed      Past Medical History:  Diagnosis Date  . Breast cancer ([place]) 2008  . Fibromyalgia   . GERD (gastroesophageal reflux disease)   . Hyperlipidemia   . S/P lumbar fusion 2005   L5-S1  . SVT (supraventricular tachycardia) (HCC)     Past Surgical History:  Procedure Laterality Date  . BREAST LUMPECTOMY  2008   LEFT  . LUMBAR FUSION  2005  . OVARY REMOVED  1997    There were no vitals filed for this visit.      Subjective Assessment - 01/26/17 1638    Subjective Doing her exercises. Pain is intermittant.    Currently in Pain? No/denies   Pain Location Neck   Pain Orientation Left   Pain Descriptors / Indicators Sharp;Shooting   Pain Type Chronic pain   Aggravating Factors  turning to look left,  wakes occasionally.  sometimes longer vacuming,  reaching up really high,  carrying things like wet laundry,   working out in garden.    Pain Relieving Factors improving posture   Multiple Pain Sites --  chronic back pain,  right knee pain chronic.  She has learned to live.                          [facility] Adult PT Treatment/Exercise - 01/26/17 0001      Self-Care   Self-Care ADL's;Scar Mobilizations   Posture sitting with foot support     Neck Exercises: Seated   Neck Retraction 5 secs;5 reps   Neck Retraction Limitations cervical  retraction +scapular retraction   Cervical Rotation 5 reps   Cervical Rotation Limitations 5 second holds   Other Seated Exercise scalular retraction 5 X 5 seconds     Neck Exercises: Supine   Neck Retraction 5 reps   Other Supine Exercise supine scapular stabilization , red band,  10 x each:  ER, Horizontal abd, narrowed grip shoulder flexion,   Sash not done due to progressive thumb soreness.   Other Supine Exercise shoulder retractions with rolled towel along spine 10 x     Shoulder Exercises: Stretch   Other Shoulder Stretches hooklying with rolled towel shoulders at 90, 3 minutes     Manual Therapy   Manual Therapy Soft tissue mobilization   Soft tissue mobilization left upper quadrant.  upper trap tissue tight,  gentle stretches to [place],  Myofascial  release                PT Education - 01/26/17 1737    Education provided Yes   Education Details posture,  ADL handout( not reviewed, she will read and bring to next session for instruction)   Person(s) Educated Patient   Methods Explanation;Demonstration;Verbal cues   Comprehension Verbalized understanding;Returned demonstration  PT Short Term Goals - 01/19/17 1721      PT SHORT TERM GOAL #1   Title Pt will demo resting posture with head at midline   Baseline resting with L cervical sidebend   Time 4   Period Weeks   Status New   Target Date 02/18/17           PT Long Term Goals - 01/19/17 1722      PT LONG TERM GOAL #1   Title Pt will report a series of at least 3 days without neck pain    Baseline daily, insidious onset, sometimes multiple in a day   Time 8   Period Weeks   Status New   Target Date 03/18/17     PT LONG TERM GOAL #2   Title 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 without limitation from cervical region   Baseline limited at eval   Time 8   Period Weeks   Status New   Target Date 03/18/17     PT LONG TERM GOAL #3   Title FOTO to 32%  limitation to demo significant improvement in functional ability   Baseline 41% limited at eval   Time 8   Period Weeks   Status New   Target Date 03/18/17     PT LONG TERM GOAL #4   Title Pt will be able to complete household chores and garden work without onset of neck pain   Baseline pain at eval   Time 8   Period Weeks   Status New   Target Date 03/18/17               Plan - 01/26/17 1739    Clinical Impression Statement Patient already showing signs of improvement in pain.  She was able to exercise with bands in supint today, however thumbs were getting sore.  She had no pain at end of session, however she was aware of wher soft tissue work had occured.  .  No new objective findings other than improved posture per report.   She has noticed she leans trunk and head to right side at home and at work.  She is able to change position when she becoms aware.    PT Treatment/Interventions ADLs/Self Care Home Management;Cryotherapy;Electrical Stimulation;Iontophoresis 4mg /ml Dexamethasone;Functional mobility training;Ultrasound;Moist Heat;Therapeutic activities;Therapeutic exercise;Neuromuscular re-education;Patient/family education;Passive range of motion;Manual techniques;Dry needling;Taping   PT Next Visit Plan pectoralis stretching, periscapular endurance, thoracic mobility   PT Home Exercise Plan cervical & scapular retraction, SCM stretch, scalene stretch   Consulted and Agree with Plan of Care Patient      Patient will benefit from skilled therapeutic intervention in order to improve the following deficits and impairments:  Decreased range of motion, Increased muscle spasms, Decreased activity tolerance, Pain, Improper body mechanics, Impaired flexibility, Postural dysfunction  Visit Diagnosis: Cervicalgia  Pain in left upper arm     Problem List Patient Active Problem List   Diagnosis Date Noted  . Depression 08/22/2012  . Insomnia 08/22/2012  . Metabolic syndrome  51/07/5850  . Type 2 diabetes mellitus ([place]) 11/28/2011  . Hyperlipidemia 12/23/2010  . GE reflux 12/23/2010  . Fibromyalgia 12/23/2010  . Breast cancer of upper-outer quadrant of left female breast ([place]) 12/23/2010  . History of supraventricular tachycardia 12/23/2010    Kaysi Ourada PTA 01/26/2017, 5:44 PM  [facility] [address] [place], [place], [address] Phone: [phone]   Fax:  [phone]  Name: Leda  MILLER EDGINGTON MRN: [id number] Date of Birth: [birth year]

## 2017-01-26 NOTE — Patient Instructions (Signed)

## 2017-02-09 ENCOUNTER — Encounter: Payer: Self-pay | Admitting: Physical Therapy

## 2017-02-09 ENCOUNTER — Ambulatory Visit: Payer: 59 | Admitting: Physical Therapy

## 2017-02-09 DIAGNOSIS — M542 Cervicalgia: Secondary | ICD-10-CM | POA: Diagnosis not present

## 2017-02-09 DIAGNOSIS — M79622 Pain in left upper arm: Secondary | ICD-10-CM

## 2017-02-09 NOTE — Therapy (Signed)
[place], [place], [address] Phone: [phone]   Fax:  [phone]  Physical Therapy Treatment/Discharge Summary  Patient Details  Name: Jill Richardson MRN: [id number] Date of Birth: 01-11-1956 Referring Provider: Erline Levine, MD  Encounter Date: 02/09/2017      PT End of Session - 02/09/17 1628    Visit Number 3   Number of Visits 9   Date for PT Re-Evaluation 03/18/17   Authorization Type [facility]   PT Start Time 1629   PT Stop Time 1651   PT Time Calculation (min) 22 min   Activity Tolerance Patient tolerated treatment well   Behavior During Therapy [facility] for tasks assessed/performed      Past Medical History:  Diagnosis Date  . Breast cancer ([place]) 2008  . Fibromyalgia   . GERD (gastroesophageal reflux disease)   . Hyperlipidemia   . S/P lumbar fusion 2005   L5-S1  . SVT (supraventricular tachycardia) (HCC)     Past Surgical History:  Procedure Laterality Date  . BREAST LUMPECTOMY  2008   LEFT  . LUMBAR FUSION  2005  . OVARY REMOVED  1997    There were no vitals filed for this visit.      Subjective Assessment - 02/09/17 1630    Subjective Neck is so much better, no pain. I do not think I need any more visits.    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 No/denies            [facility] PT Assessment - 02/09/17 0001      Observation/Other Assessments   Focus on Therapeutic Outcomes (FOTO)  17% limitaiton     Sensation   Additional Comments WFL     AROM   Cervical - Right Side Bend --  feels a stretch rather than painful pull                     [facility] Adult PT Treatment/Exercise - 02/09/17 0001      Neck Exercises: Seated   Other Seated Exercise GHJ ER green tband     Neck Exercises: Supine   Other Supine Exercise supine red tband horiz abd &  diagonals                PT Education - 02/09/17 1654    Education provided Yes   Education Details importance of continued stretching and strengthening, postural awareness, progress toward goals, HEP   Person(s) Educated Patient   Methods Explanation;Demonstration;Verbal cues;Handout   Comprehension Verbalized understanding;Returned demonstration          PT Short Term Goals - 02/09/17 1657      PT SHORT TERM GOAL #1   Title Pt will demo resting posture with head at midline   Baseline slight L sidebend that pt is aware of and able to move toward midline without visual cues   Status Partially Met           PT Long Term Goals - 02/09/17 1633      PT LONG TERM GOAL #1   Title Pt will report a series of at least 3 days without neck pain    Baseline no pain since beginning   Status Achieved     PT LONG TERM GOAL #2   Title Pt will 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 She has met all of her goals at this time. Still limited in R cervical sidebend and was educated in importance of continued stretching and postural awareness to avoid return of pain. Pt demo good control with exercises and verbalized understanding. Was encouraged to contact us with any further questions.    PT Treatment/Interventions ADLs/Self Care Home Management;Cryotherapy;Electrical Stimulation;Iontophoresis 75m/ml  Dexamethasone;Functional mobility training;Ultrasound;Moist Heat;Therapeutic activities;Therapeutic exercise;Neuromuscular re-education;Patient/family education;Passive range of motion;Manual techniques;Dry needling;Taping   PT Home Exercise Plan cervical & scapular retraction, SCM stretch, scalene stretch; supine horiz abd, supine diagonals, seated GHJ ER   Consulted and Agree with Plan of Care Patient      Patient will benefit from skilled therapeutic intervention in order to improve the following deficits and impairments:  Decreased range of motion, Increased muscle spasms, Decreased activity tolerance, Pain, Improper body mechanics, Impaired flexibility, Postural dysfunction  Visit Diagnosis: Cervicalgia  Pain in left upper arm     Problem List Patient Active Problem List   Diagnosis Date Noted  . Depression 08/22/2012  . Insomnia 08/22/2012  . Metabolic syndrome 199/37/1696 . Type 2 diabetes mellitus ([place] 11/28/2011  . Hyperlipidemia 12/23/2010  . GE reflux 12/23/2010  . Fibromyalgia 12/23/2010  . Breast cancer of upper-outer quadrant of left female breast ([place] 12/23/2010  . History of supraventricular tachycardia 12/23/2010   PHYSICAL THERAPY DISCHARGE SUMMARY  Visits from Start of Care: 3  Current functional level related to goals / functional outcomes: See above   Remaining deficits: See above   Education / Equipment: Anatomy of condition, POC, HEP, exercise form/rationale  Plan: Patient agrees to discharge.  Patient goals were met. Patient is being discharged due to meeting the stated rehab goals.  ?????     Sharmain Lastra C. Kyria Bumgardner PT, DPT 02/09/17 4:59 PM     [facility] [place] [address]Phone: [phone]  Fax:  [phone] Name: Jill GESSELMRN: [id number] of Birth: 303-Aug-1957

## 2017-02-16 ENCOUNTER — Encounter: Payer: 59 | Admitting: Physical Therapy

## 2017-02-23 ENCOUNTER — Encounter: Payer: 59 | Admitting: Physical Therapy

## 2017-03-01 MED FILL — VENLAFAXINE HCL ER 37.5 MG: 37.5 | 90 days supply | Qty: 90 | Fill #1

## 2017-03-01 MED FILL — JANUVIA 100 MG TABLET: 100 | 90 days supply | Qty: 90 | Fill #3

## 2017-03-09 DIAGNOSIS — R030 Elevated blood-pressure reading, without diagnosis of hypertension: Secondary | ICD-10-CM | POA: Diagnosis not present

## 2017-03-09 DIAGNOSIS — Q761 Klippel-Feil syndrome: Secondary | ICD-10-CM | POA: Diagnosis not present

## 2017-03-09 DIAGNOSIS — M4120 Other idiopathic scoliosis, site unspecified: Secondary | ICD-10-CM | POA: Diagnosis not present

## 2017-03-09 DIAGNOSIS — M542 Cervicalgia: Secondary | ICD-10-CM | POA: Diagnosis not present

## 2017-03-09 DIAGNOSIS — M549 Dorsalgia, unspecified: Secondary | ICD-10-CM | POA: Diagnosis not present

## 2017-03-09 DIAGNOSIS — Z6827 Body mass index (BMI) 27.0-27.9, adult: Secondary | ICD-10-CM | POA: Diagnosis not present

## 2017-03-23 DIAGNOSIS — H524 Presbyopia: Secondary | ICD-10-CM | POA: Diagnosis not present

## 2017-04-05 MED FILL — RAMIPRIL 2.5 MG CAPSULE: 2.5 | 90 days supply | Qty: 90 | Fill #1

## 2017-04-05 MED FILL — FENOFIBRATE 160 MG TABLET: 160 | 90 days supply | Qty: 90 | Fill #1

## 2017-04-05 MED FILL — metFORMIN HCL 500 MG TABS: 500 | 90 days supply | Qty: 180 | Fill #3

## 2017-04-05 MED FILL — ROSUVASTATIN CALCIUM 10 MG: 10 | 90 days supply | Qty: 90 | Fill #1

## 2017-04-11 ENCOUNTER — Other Ambulatory Visit: Payer: Self-pay | Admitting: Adult Health

## 2017-04-11 DIAGNOSIS — Z1231 Encounter for screening mammogram for malignant neoplasm of breast: Secondary | ICD-10-CM

## 2017-04-15 MED FILL — PANTOPRAZOLE SOD DR 40 MG T: 40 | 90 days supply | Qty: 90 | Fill #3

## 2017-04-15 MED FILL — PEG-3350 SOLUTION: 420 | 1 days supply | Qty: 4000 | Fill #0

## 2017-04-21 ENCOUNTER — Encounter: Payer: Self-pay | Admitting: Internal Medicine

## 2017-04-21 ENCOUNTER — Ambulatory Visit (INDEPENDENT_AMBULATORY_CARE_PROVIDER_SITE_OTHER): Payer: 59 | Admitting: Internal Medicine

## 2017-04-21 VITALS — BP 148/100 | HR 58 | Temp 97.8°F | Wt 150.0 lb

## 2017-04-21 DIAGNOSIS — J040 Acute laryngitis: Secondary | ICD-10-CM | POA: Diagnosis not present

## 2017-04-21 DIAGNOSIS — R059 Cough, unspecified: Secondary | ICD-10-CM

## 2017-04-21 DIAGNOSIS — R05 Cough: Secondary | ICD-10-CM | POA: Diagnosis not present

## 2017-04-21 MED ORDER — LEVOFLOXACIN 500 MG PO TABS: 500.0000 mg | ORAL_TABLET | Freq: Every day | ORAL | 0 refills | Status: DC

## 2017-04-21 MED ORDER — HYDROCOD POLST-CPM POLST ER 10-8 MG/5ML PO SUER: 5.0000 mL | Freq: Two times a day (BID) | ORAL | 0 refills | Status: DC | PRN

## 2017-04-21 MED FILL — HYDROCODONE-CHLORPHEN ER SU: 10-8 | 14 days supply | Qty: 140 | Fill #0

## 2017-04-21 MED FILL — levoFLOXacin 500 MG TABS: 500 | 10 days supply | Qty: 10 | Fill #0

## 2017-04-21 NOTE — Patient Instructions (Signed)
Tussionex 1 teaspoon p.o. every 12 hours as needed cough.  Levaquin 500 mg daily for 10 days.  Rest and drink plenty of fluids.

## 2017-04-21 NOTE — Progress Notes (Signed)
   Subjective:    Patient ID: Jill Richardson, female    DOB: 1955/10/31, 61 y.o.   MRN: [id number]  HPI Patient has come down with respiratory infection and laryngitis.  Has cough.  Hoarse voice.  No fever or shaking chills.  Had similar episode in 2016 treated with Levaquin and Tussionex.  She is working in e- [employer].  She went to physical therapy for cervicalgia and improved.    Review of Systems see above     Objective:   Physical Exam Skin warm and dry.  Nodes none.  Pharynx very slightly injected.  TMs are clear.  Neck is supple.  Chest clear.  She is very hoarse.       Assessment & Plan:  Acute laryngitis  Cough  Plan: Levaquin 500 mg daily for 10 days.  Tussionex 1 teaspoon p.o. every 12 hours as needed cough.

## 2017-04-25 DIAGNOSIS — K5140 Inflammatory polyps of colon without complications: Secondary | ICD-10-CM | POA: Diagnosis not present

## 2017-04-25 DIAGNOSIS — Z1211 Encounter for screening for malignant neoplasm of colon: Secondary | ICD-10-CM | POA: Diagnosis not present

## 2017-04-25 DIAGNOSIS — K621 Rectal polyp: Secondary | ICD-10-CM | POA: Diagnosis not present

## 2017-04-26 MED FILL — DIGOXIN 250 MCG TAB: 250 | 90 days supply | Qty: 90 | Fill #1

## 2017-05-23 MED FILL — VENLAFAXINE HCL ER 37.5 MG: 37.5 | 90 days supply | Qty: 90 | Fill #2

## 2017-05-27 ENCOUNTER — Other Ambulatory Visit: Payer: 59 | Admitting: Internal Medicine

## 2017-05-30 ENCOUNTER — Other Ambulatory Visit: Payer: Self-pay | Admitting: Internal Medicine

## 2017-05-30 ENCOUNTER — Encounter: Payer: 59 | Admitting: Internal Medicine

## 2017-05-30 MED FILL — JANUVIA 100 MG TABLET: 100 | 90 days supply | Qty: 90 | Fill #0

## 2017-06-17 ENCOUNTER — Other Ambulatory Visit: Payer: 59

## 2017-06-17 ENCOUNTER — Ambulatory Visit: Payer: 59

## 2017-06-27 ENCOUNTER — Other Ambulatory Visit: Payer: Self-pay | Admitting: Internal Medicine

## 2017-06-27 DIAGNOSIS — Z1321 Encounter for screening for nutritional disorder: Secondary | ICD-10-CM

## 2017-06-27 DIAGNOSIS — Z1329 Encounter for screening for other suspected endocrine disorder: Secondary | ICD-10-CM

## 2017-06-27 DIAGNOSIS — E785 Hyperlipidemia, unspecified: Secondary | ICD-10-CM

## 2017-06-27 DIAGNOSIS — E118 Type 2 diabetes mellitus with unspecified complications: Secondary | ICD-10-CM

## 2017-06-27 DIAGNOSIS — Z0000 Encounter for general adult medical examination without abnormal findings: Secondary | ICD-10-CM

## 2017-06-29 ENCOUNTER — Ambulatory Visit
Admission: RE | Admit: 2017-06-29 | Discharge: 2017-06-29 | Disposition: A | Discharge status: Home / Self Care | Payer: 59 | Source: Ambulatory Visit | Attending: Adult Health | Admitting: Adult Health

## 2017-06-29 DIAGNOSIS — Z1231 Encounter for screening mammogram for malignant neoplasm of breast: Secondary | ICD-10-CM

## 2017-06-29 DIAGNOSIS — Z780 Asymptomatic menopausal state: Secondary | ICD-10-CM | POA: Diagnosis not present

## 2017-06-29 DIAGNOSIS — E2839 Other primary ovarian failure: Secondary | ICD-10-CM

## 2017-06-29 DIAGNOSIS — M8588 Other specified disorders of bone density and structure, other site: Secondary | ICD-10-CM | POA: Diagnosis not present

## 2017-07-01 ENCOUNTER — Telehealth: Payer: Self-pay

## 2017-07-01 NOTE — Telephone Encounter (Signed)
-----   Message from Gardenia Phlegm, NP sent at 07/01/2017  3:16 PM EST ----- Please let patient know her bone density is consistent with osteopenia.  Recommend calcium, vitamin d and weight bearing exercises. ----- Message ----- From: Interface, Rad Results In Sent: 06/29/2017  10:24 AM To: Gardenia Phlegm, NP

## 2017-07-01 NOTE — Telephone Encounter (Signed)
Called patient to give bone density scan results consistent with osteopenia.  Recommended from NP to to start calcium, vitamin d and weight bearing exercises if she has not already.  Pt takes calcium and vitamin d but says she could increase her exercise.  Patient voiced understanding of results, no questions or concerns at this time.

## 2017-07-05 ENCOUNTER — Other Ambulatory Visit: Payer: Self-pay | Admitting: Internal Medicine

## 2017-07-05 MED FILL — OXAPROZIN 600 MG TABLET: 600 | 90 days supply | Qty: 180 | Fill #1

## 2017-07-05 MED FILL — metFORMIN HCL 500 MG TABS: 500 | 90 days supply | Qty: 180 | Fill #0

## 2017-07-05 MED FILL — ROSUVASTATIN CALCIUM 10 MG: 10 | 90 days supply | Qty: 90 | Fill #0

## 2017-07-05 MED FILL — FENOFIBRATE 160 MG TABLET: 160 | 90 days supply | Qty: 90 | Fill #0

## 2017-07-05 MED FILL — RAMIPRIL 2.5 MG CAPSULE: 2.5 | 90 days supply | Qty: 90 | Fill #0

## 2017-07-05 NOTE — Telephone Encounter (Signed)
Refill all x 6 months 

## 2017-07-08 ENCOUNTER — Other Ambulatory Visit: Payer: 59 | Admitting: Internal Medicine

## 2017-07-08 DIAGNOSIS — E785 Hyperlipidemia, unspecified: Secondary | ICD-10-CM | POA: Diagnosis not present

## 2017-07-08 DIAGNOSIS — E118 Type 2 diabetes mellitus with unspecified complications: Secondary | ICD-10-CM | POA: Diagnosis not present

## 2017-07-08 DIAGNOSIS — Z0000 Encounter for general adult medical examination without abnormal findings: Secondary | ICD-10-CM

## 2017-07-08 DIAGNOSIS — Z1321 Encounter for screening for nutritional disorder: Secondary | ICD-10-CM

## 2017-07-08 DIAGNOSIS — Z1329 Encounter for screening for other suspected endocrine disorder: Secondary | ICD-10-CM

## 2017-07-09 LAB — CBC WITH DIFFERENTIAL/PLATELET
BASOS PCT: 1.1 %
Basophils Absolute: 62 cells/uL (ref 0–200)
EOS PCT: 4.3 %
Eosinophils Absolute: 241 cells/uL (ref 15–500)
HEMATOCRIT: 37.2 % (ref 35.0–45.0)
HEMOGLOBIN: 12.4 g/dL (ref 11.7–15.5)
LYMPHS ABS: 1310 {cells}/uL (ref 850–3900)
MCH: 28.1 pg (ref 27.0–33.0)
MCHC: 33.3 g/dL (ref 32.0–36.0)
MCV: 84.2 fL (ref 80.0–100.0)
MPV: 9.7 fL (ref 7.5–12.5)
Monocytes Relative: 6.1 %
NEUTROS ABS: 3646 {cells}/uL (ref 1500–7800)
NEUTROS PCT: 65.1 %
Platelets: 367 10*3/uL (ref 140–400)
RBC: 4.42 10*6/uL (ref 3.80–5.10)
RDW: 13.5 % (ref 11.0–15.0)
Total Lymphocyte: 23.4 %
WBC: 5.6 10*3/uL (ref 3.8–10.8)
WBCMIX: 342 {cells}/uL (ref 200–950)

## 2017-07-09 LAB — LIPID PANEL
Cholesterol: 153 mg/dL (ref <200)
HDL: 51 mg/dL (ref >50)
LDL CHOLESTEROL (CALC): 76 mg/dL
NON-HDL CHOLESTEROL (CALC): 102 mg/dL (ref <130)
Total CHOL/HDL Ratio: 3 (calc) (ref <5.0)
Triglycerides: 156 mg/dL — ABNORMAL HIGH (ref <150)

## 2017-07-09 LAB — COMPLETE METABOLIC PANEL WITH GFR
AG Ratio: 1.5 (calc) (ref 1.0–2.5)
ALKALINE PHOSPHATASE (APISO): 29 U/L — AB (ref 33–130)
ALT: 25 U/L (ref 6–29)
AST: 26 U/L (ref 10–35)
Albumin: 4.1 g/dL (ref 3.6–5.1)
BILIRUBIN TOTAL: 0.4 mg/dL (ref 0.2–1.2)
BUN: 22 mg/dL (ref 7–25)
CHLORIDE: 106 mmol/L (ref 98–110)
CO2: 29 mmol/L (ref 20–32)
CREATININE: 0.98 mg/dL (ref 0.50–0.99)
Calcium: 9.5 mg/dL (ref 8.6–10.4)
GFR, Est African American: 71 mL/min/{1.73_m2} (ref >=60)
GFR, Est Non African American: 61 mL/min/{1.73_m2} (ref >=60)
GLUCOSE: 82 mg/dL (ref 65–99)
Globulin: 2.7 g/dL (calc) (ref 1.9–3.7)
Potassium: 4.9 mmol/L (ref 3.5–5.3)
Sodium: 143 mmol/L (ref 135–146)
Total Protein: 6.8 g/dL (ref 6.1–8.1)

## 2017-07-09 LAB — VITAMIN D 25 HYDROXY (VIT D DEFICIENCY, FRACTURES): VIT D 25 HYDROXY: 30 ng/mL (ref 30–100)

## 2017-07-09 LAB — MICROALBUMIN / CREATININE URINE RATIO
CREATININE, URINE: 210 mg/dL (ref 20–275)
Microalb Creat Ratio: 7 mcg/mg creat (ref <30)
Microalb, Ur: 1.4 mg/dL

## 2017-07-09 LAB — HEMOGLOBIN A1C
Hgb A1c MFr Bld: 5.9 % of total Hgb — ABNORMAL HIGH (ref <5.7)
Mean Plasma Glucose: 123 (calc)
eAG (mmol/L): 6.8 (calc)

## 2017-07-09 LAB — TSH: TSH: 1.43 m[IU]/L (ref 0.40–4.50)

## 2017-07-15 ENCOUNTER — Encounter: Payer: Self-pay | Admitting: Internal Medicine

## 2017-07-15 ENCOUNTER — Ambulatory Visit (INDEPENDENT_AMBULATORY_CARE_PROVIDER_SITE_OTHER): Payer: 59 | Admitting: Internal Medicine

## 2017-07-15 VITALS — BP 150/80 | HR 79 | Ht 60.0 in | Wt 151.0 lb

## 2017-07-15 DIAGNOSIS — Z853 Personal history of malignant neoplasm of breast: Secondary | ICD-10-CM

## 2017-07-15 DIAGNOSIS — Z0000 Encounter for general adult medical examination without abnormal findings: Secondary | ICD-10-CM

## 2017-07-15 DIAGNOSIS — F5101 Primary insomnia: Secondary | ICD-10-CM

## 2017-07-15 DIAGNOSIS — K219 Gastro-esophageal reflux disease without esophagitis: Secondary | ICD-10-CM

## 2017-07-15 DIAGNOSIS — I471 Supraventricular tachycardia, unspecified: Secondary | ICD-10-CM

## 2017-07-15 DIAGNOSIS — R232 Flushing: Secondary | ICD-10-CM | POA: Diagnosis not present

## 2017-07-15 DIAGNOSIS — F3289 Other specified depressive episodes: Secondary | ICD-10-CM | POA: Diagnosis not present

## 2017-07-15 DIAGNOSIS — Z8739 Personal history of other diseases of the musculoskeletal system and connective tissue: Secondary | ICD-10-CM

## 2017-07-15 DIAGNOSIS — E119 Type 2 diabetes mellitus without complications: Secondary | ICD-10-CM | POA: Diagnosis not present

## 2017-07-15 DIAGNOSIS — E8881 Metabolic syndrome: Secondary | ICD-10-CM

## 2017-07-15 DIAGNOSIS — E7800 Pure hypercholesterolemia, unspecified: Secondary | ICD-10-CM

## 2017-07-15 LAB — POCT URINALYSIS DIPSTICK
Appearance: NORMAL
Bilirubin, UA: NEGATIVE
Blood, UA: NEGATIVE
Glucose, UA: NEGATIVE
Ketones, UA: NEGATIVE
LEUKOCYTES UA: NEGATIVE
NITRITE UA: NEGATIVE
ODOR: NORMAL
PROTEIN UA: NEGATIVE
SPEC GRAV UA: 1.015 (ref 1.010–1.025)
Urobilinogen, UA: 0.2 E.U./dL
pH, UA: 6.5 (ref 5.0–8.0)

## 2017-07-15 MED ORDER — VENLAFAXINE HCL ER 37.5 MG PO CP24: 37.5000 mg | ORAL_CAPSULE | Freq: Every day | ORAL | 3 refills | Status: DC

## 2017-07-15 NOTE — Progress Notes (Signed)
Subjective:    Patient ID: Jill Richardson, female    DOB: 01-Jul-1955, 62 y.o.   MRN: [id number]  HPI  62 year old Female for health maintenance exam and evaluation of medical issues.  History of low back pain with lumbar fusion L5-S1 in 2005 using heel procedure with cages and pedicle screws by Dr. Carloyn Manner.  History of breast cancer, controlled type 2 diabetes mellitus, hyperlipidemia, GE reflux, fibromyalgia, obesity, remote history of supraventricular tachycardia.  She is allergic to Lodine.  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  Able to tolerate Daypro for musculoskeletal pain.  Left oophorectomy March 1997.  Exploratory laparotomy for small bowel obstruction 1 month later.  Colonoscopy done in 2008.  History of dysplastic nevus of the shoulder 2008.  Long-standing history of fibromyalgia syndrome dating back to the 1990s.  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  She has had 2 pregnancies and no miscarriages.  Menarche at age 9.  Menopausal around age 52.  When she took estrogen replacement therapy until her cancer diagnosis.  Tumor was ER/PR positive with evidence of lymphovascular invasion.  Nodes were negative.  She had radiation and lumpectomy.  Radiation was completed April 2008.  She was treated with tamoxifen and Femara.  She takes Crestor for hyperlipidemia.  She has Flexeril for back pain.  She takes Ativan for insomnia.  Occasionally takes tramadol for back pain.  Occasionally takes Norco for back pain.  History of GE reflux treated with PPI.  She is on metformin and Januvia for diabetes mellitus with good control.  Has flu vaccine through employment.  Social history: She is married.  Has 2 grandchildren.  Husband is disabled due to chronic brain injury after an accident involving a tree falling on him in the mid 1990s.  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  2 adult sons.  Family  history: Father with history of diabetes and hypertension died from complications of heart failure.  Mother in good health.  Brother with history of hypertension.  Sister in good health.  Colonoscopy in 2018.  Review of Systems  Respiratory: Negative.   Cardiovascular: Negative.   Gastrointestinal: Negative.   Genitourinary: Negative.   Musculoskeletal: Positive for arthralgias and back pain.  Neurological: Negative.   Psychiatric/Behavioral: Negative.    occasional neck pain     Objective:   Physical Exam  Constitutional: She appears well-developed and well-nourished. No distress.  HENT:  Head: Normocephalic and atraumatic.  Right Ear: External ear normal.  Left Ear: External ear normal.  Mouth/Throat: Oropharynx is clear and moist.  Eyes: Conjunctivae and EOM are normal. Pupils are equal, round, and reactive to light. Right eye exhibits no discharge. Left eye exhibits no discharge.  Neck: Neck supple. No JVD present. No thyromegaly present.  Cardiovascular: Normal rate, regular rhythm, normal heart sounds and intact distal pulses.  No murmur heard. Pulmonary/Chest: She has no wheezes. She has no rales.  Abdominal: Soft. Bowel sounds are normal. She exhibits no distension and no mass. There is no tenderness. There is no rebound and no guarding.  Genitourinary:  Genitourinary Comments: Deferred to GYN  Musculoskeletal: She exhibits no edema.  Lymphadenopathy:    She has no cervical adenopathy.  Skin: Skin is warm and dry. No rash noted. She is not diaphoretic.  Psychiatric: She has a normal mood and affect. Her behavior is normal. Judgment and thought content normal.  Vitals reviewed.  Assessment & Plan:  Fibromyalgia syndrome  History of back pain with L5-S1 fusion in 2005  Controlled type 2 diabetes mellitus  History of breast cancer  History of supraventricular tachycardia  Insomnia  Depression  Hyperlipidemia  Obesity-continue diet and exercise  efforts  History of dysplastic nevus 2008  GE reflux  Metabolic syndrome  Inflammatory polyp on colonoscopy by Dr. Cristina Gong October 2018  Her lab work is reviewed and is essentially within normal limits.  She has excellent control of diabetes at this point time.  Return in 6 months or as needed.  Flu vaccine given through employment.  Tdap will be due in March and may be given through employment if available.

## 2017-07-19 MED FILL — PANTOPRAZOLE SOD DR 40 MG T: 40 | 90 days supply | Qty: 90 | Fill #4

## 2017-08-01 ENCOUNTER — Other Ambulatory Visit: Payer: Self-pay | Admitting: Internal Medicine

## 2017-08-01 MED FILL — DIGOXIN 250 MCG TAB: 250 | 90 days supply | Qty: 90 | Fill #0

## 2017-08-08 ENCOUNTER — Telehealth: Payer: Self-pay

## 2017-08-08 DIAGNOSIS — Z7721 Contact with and (suspected) exposure to potentially hazardous body fluids: Secondary | ICD-10-CM

## 2017-08-08 MED ORDER — OSELTAMIVIR PHOSPHATE 75 MG PO CAPS: 75.0000 mg | ORAL_CAPSULE | Freq: Two times a day (BID) | ORAL | 0 refills | Status: DC

## 2017-08-08 MED FILL — OSELTAMIVIR PHOSPHATE 75 MG: 75 | 5 days supply | Qty: 10 | Fill #0

## 2017-08-08 NOTE — Telephone Encounter (Signed)
Call in Tamiflu 75 mg twice a day x 5 days. 

## 2017-08-08 NOTE — Telephone Encounter (Signed)
Patient's granddaughter has the flu and patient has been around her and is wondering if you would send in tamiflu to [facility]?

## 2017-08-18 ENCOUNTER — Other Ambulatory Visit: Payer: Self-pay | Admitting: Internal Medicine

## 2017-08-18 MED FILL — VENLAFAXINE HCL ER 37.5 MG: 37.5 | 90 days supply | Qty: 90 | Fill #3

## 2017-08-18 MED FILL — LORazepam 2 MG TABS: 2 | 90 days supply | Qty: 90 | Fill #0

## 2017-08-18 MED FILL — ACCU-CHEK GUIDE TEST STRIP: 50 days supply | Qty: 100 | Fill #1

## 2017-08-18 NOTE — Telephone Encounter (Signed)
Refill x 6 months 

## 2017-09-06 MED FILL — JANUVIA 100 MG TABLET: 100 | 90 days supply | Qty: 90 | Fill #1

## 2017-09-21 DIAGNOSIS — J0100 Acute maxillary sinusitis, unspecified: Secondary | ICD-10-CM | POA: Diagnosis not present

## 2017-09-21 DIAGNOSIS — H66009 Acute suppurative otitis media without spontaneous rupture of ear drum, unspecified ear: Secondary | ICD-10-CM | POA: Diagnosis not present

## 2017-09-26 ENCOUNTER — Encounter: Payer: Self-pay | Admitting: Internal Medicine

## 2017-09-26 ENCOUNTER — Ambulatory Visit (INDEPENDENT_AMBULATORY_CARE_PROVIDER_SITE_OTHER): Payer: 59 | Admitting: Internal Medicine

## 2017-09-26 VITALS — BP 120/80 | HR 74 | Temp 98.1°F | Ht 60.0 in | Wt 152.0 lb

## 2017-09-26 DIAGNOSIS — H6121 Impacted cerumen, right ear: Secondary | ICD-10-CM | POA: Diagnosis not present

## 2017-09-26 DIAGNOSIS — H60501 Unspecified acute noninfective otitis externa, right ear: Secondary | ICD-10-CM

## 2017-09-26 DIAGNOSIS — E118 Type 2 diabetes mellitus with unspecified complications: Secondary | ICD-10-CM | POA: Diagnosis not present

## 2017-09-26 MED ORDER — NEOMYCIN-POLYMYXIN-HC 1 % OT SOLN: OTIC | 0 refills | Status: DC

## 2017-09-26 MED FILL — NEO/POLYMYXIN/HC EAR SOLN: 3.5-10000-1 | 12 days supply | Qty: 10 | Fill #0

## 2017-09-26 NOTE — Progress Notes (Signed)
   Subjective:    Patient ID: Jill Richardson, female    DOB: 10-09-1955, 63 y.o.   MRN: [id number]  HPI Developed hearing loss in right ear last week. Thought she had cerumen impaction. Went to Urgent care and was told she had ear infection and was given antibiotics. Pt says still cannot hear and she tried irrigating her right ear 3 times. It is now a bit sore. No fever or chills.    Review of Systems see above-she has a history of diabetes mellitus     Objective:   She has partially impacted cerumen right ear. This was partially removed with curette and she can now hear out of that ear.  The canal is slightly irritated.  It is a bit erythematous.       Assessment & Plan:  Impacted cerumen right external ear canal  Right otitis externa    Plan: She will see ENT physician to have cerumen removed.  Cortisporin otic suspension and right external ear canal 4 drops 4 times a day.  Finish course of antibiotics previously prescribed by urgent care physician.

## 2017-09-26 NOTE — Patient Instructions (Signed)
Cortisporin otic suspension 4 drops in the right ear 4 times a day for 5 days.  Finish course of antibiotics prescribed by urgent care physician.  See ENT physician for cerumen removal.

## 2017-09-28 DIAGNOSIS — H60333 Swimmer's ear, bilateral: Secondary | ICD-10-CM | POA: Diagnosis not present

## 2017-09-28 DIAGNOSIS — H6123 Impacted cerumen, bilateral: Secondary | ICD-10-CM | POA: Diagnosis not present

## 2017-10-03 MED FILL — RAMIPRIL 2.5 MG CAPSULE: 2.5 | 90 days supply | Qty: 90 | Fill #1

## 2017-10-03 MED FILL — ROSUVASTATIN CALCIUM 10 MG: 10 | 90 days supply | Qty: 90 | Fill #1

## 2017-10-10 ENCOUNTER — Other Ambulatory Visit: Payer: Self-pay | Admitting: Internal Medicine

## 2017-10-10 MED FILL — FENOFIBRATE 160 MG TABLET: 160 | 90 days supply | Qty: 90 | Fill #1

## 2017-10-10 MED FILL — metFORMIN HCL 500 MG TABS: 500 | 90 days supply | Qty: 180 | Fill #1

## 2017-10-10 MED FILL — PANTOPRAZOLE SOD DR 40 MG T: 40 | 90 days supply | Qty: 90 | Fill #0

## 2017-10-24 MED FILL — DIGOXIN 250 MCG TAB: 250 | 90 days supply | Qty: 90 | Fill #1

## 2017-11-15 ENCOUNTER — Inpatient Hospital Stay: Payer: 59 | Admitting: Adult Health

## 2017-11-24 MED FILL — VENLAFAXINE HCL ER 37.5 MG: 37.5 | 90 days supply | Qty: 90 | Fill #0

## 2017-12-08 ENCOUNTER — Other Ambulatory Visit: Payer: Self-pay

## 2017-12-08 DIAGNOSIS — E119 Type 2 diabetes mellitus without complications: Secondary | ICD-10-CM

## 2017-12-08 DIAGNOSIS — E8881 Metabolic syndrome: Secondary | ICD-10-CM

## 2017-12-08 DIAGNOSIS — Z79899 Other long term (current) drug therapy: Secondary | ICD-10-CM

## 2017-12-08 DIAGNOSIS — Z5181 Encounter for therapeutic drug level monitoring: Secondary | ICD-10-CM

## 2017-12-08 DIAGNOSIS — E785 Hyperlipidemia, unspecified: Secondary | ICD-10-CM

## 2017-12-19 MED FILL — JANUVIA 100 MG TABLET: 100 | 90 days supply | Qty: 90 | Fill #2

## 2018-01-02 ENCOUNTER — Other Ambulatory Visit: Payer: Self-pay | Admitting: Internal Medicine

## 2018-01-02 MED FILL — RAMIPRIL 2.5 MG CAPSULE: 2.5 | 90 days supply | Qty: 90 | Fill #0

## 2018-01-02 MED FILL — ROSUVASTATIN CALCIUM 10 MG: 10 | 90 days supply | Qty: 90 | Fill #0

## 2018-01-06 ENCOUNTER — Other Ambulatory Visit: Payer: 59 | Admitting: Internal Medicine

## 2018-01-06 DIAGNOSIS — E8881 Metabolic syndrome: Secondary | ICD-10-CM

## 2018-01-06 DIAGNOSIS — E785 Hyperlipidemia, unspecified: Secondary | ICD-10-CM | POA: Diagnosis not present

## 2018-01-06 DIAGNOSIS — Z5181 Encounter for therapeutic drug level monitoring: Secondary | ICD-10-CM

## 2018-01-06 DIAGNOSIS — E119 Type 2 diabetes mellitus without complications: Secondary | ICD-10-CM | POA: Diagnosis not present

## 2018-01-06 DIAGNOSIS — Z79899 Other long term (current) drug therapy: Secondary | ICD-10-CM

## 2018-01-07 LAB — HEPATIC FUNCTION PANEL
AG Ratio: 1.6 (calc) (ref 1.0–2.5)
ALBUMIN MSPROF: 4 g/dL (ref 3.6–5.1)
ALT: 24 U/L (ref 6–29)
AST: 24 U/L (ref 10–35)
Alkaline phosphatase (APISO): 29 U/L — ABNORMAL LOW (ref 33–130)
BILIRUBIN INDIRECT: 0.2 mg/dL (ref 0.2–1.2)
BILIRUBIN TOTAL: 0.3 mg/dL (ref 0.2–1.2)
Bilirubin, Direct: 0.1 mg/dL (ref 0.0–0.2)
GLOBULIN: 2.5 g/dL (ref 1.9–3.7)
Total Protein: 6.5 g/dL (ref 6.1–8.1)

## 2018-01-07 LAB — LIPID PANEL
CHOL/HDL RATIO: 3.3 (calc) (ref <5.0)
CHOLESTEROL: 153 mg/dL (ref <200)
HDL: 46 mg/dL — AB (ref >50)
LDL Cholesterol (Calc): 83 mg/dL (calc)
NON-HDL CHOLESTEROL (CALC): 107 mg/dL (ref <130)
TRIGLYCERIDES: 144 mg/dL (ref <150)

## 2018-01-07 LAB — HEMOGLOBIN A1C
EAG (MMOL/L): 6.8 (calc)
Hgb A1c MFr Bld: 5.9 % of total Hgb — ABNORMAL HIGH (ref <5.7)
Mean Plasma Glucose: 123 (calc)

## 2018-01-07 LAB — MICROALBUMIN / CREATININE URINE RATIO
Creatinine, Urine: 149 mg/dL (ref 20–275)
Microalb Creat Ratio: 8 mcg/mg creat (ref <30)
Microalb, Ur: 1.2 mg/dL

## 2018-01-13 ENCOUNTER — Encounter: Payer: Self-pay | Admitting: Internal Medicine

## 2018-01-13 ENCOUNTER — Ambulatory Visit (INDEPENDENT_AMBULATORY_CARE_PROVIDER_SITE_OTHER): Payer: 59 | Admitting: Internal Medicine

## 2018-01-13 VITALS — BP 120/80 | HR 76 | Ht 60.0 in | Wt 153.0 lb

## 2018-01-13 DIAGNOSIS — E782 Mixed hyperlipidemia: Secondary | ICD-10-CM

## 2018-01-13 DIAGNOSIS — M797 Fibromyalgia: Secondary | ICD-10-CM | POA: Diagnosis not present

## 2018-01-13 DIAGNOSIS — F5101 Primary insomnia: Secondary | ICD-10-CM | POA: Diagnosis not present

## 2018-01-13 DIAGNOSIS — K429 Umbilical hernia without obstruction or gangrene: Secondary | ICD-10-CM

## 2018-01-13 DIAGNOSIS — S80811A Abrasion, right lower leg, initial encounter: Secondary | ICD-10-CM

## 2018-01-13 DIAGNOSIS — E119 Type 2 diabetes mellitus without complications: Secondary | ICD-10-CM | POA: Diagnosis not present

## 2018-01-13 DIAGNOSIS — E8881 Metabolic syndrome: Secondary | ICD-10-CM | POA: Diagnosis not present

## 2018-01-13 DIAGNOSIS — Z853 Personal history of malignant neoplasm of breast: Secondary | ICD-10-CM | POA: Diagnosis not present

## 2018-01-13 MED ORDER — MUPIROCIN 2 % EX OINT: TOPICAL_OINTMENT | CUTANEOUS | 0 refills | Status: DC

## 2018-01-13 MED FILL — MUPIROCIN 2% OINTMENT: 2 | 20 days supply | Qty: 22 | Fill #0

## 2018-01-13 NOTE — Patient Instructions (Signed)
It was a pleasure to see you today.  Please have tetanus immunization update done at employee health.  Apply Bactroban to abraded area twice a day.  See surgeon regarding follow-up of periumbilical pain and hernia.  Follow-up here in 6 months for physical examination and continue same medications

## 2018-01-13 NOTE — Progress Notes (Signed)
   Subjective:    Patient ID: Jill Richardson, female    DOB: 07-20-1955, 62 y.o.   MRN: [id number]  HPI  Pt is here for 79-month follow-up of multiple medical issues including  controlled type 2 diabetes mellitus without complication, mixed hyperlipidemia, fibromyalgia syndrome, primary insomnia, remote history of breast cancer, GE reflux .  She has an injury  to right anterior shin secondary to tripping that appears to be red in multiple areas that are abraded.  She needs to get tetanus immunization follow-up through Employee health.  Have prescribed Bactroban ointment.  Complaining today of new issue with periumbilical abdominal pain.  When she sits up from a lying position you can see a small umbilical hernia.  This is been causing apparently some pain in that area surrounding the umbilicus.  We will refer her to surgeon for further evaluation.  Remote history of breast cancer which is in remission.  Fibromyalgia-stable with current treatment.  Lipid panel and liver functions within normal limits on statin and fenofibrate.  Hemoglobin A1c stable at 5.9% on current regimen.  She is enrolled in the [facility] [place] program at [facility] for diabetic management.    Review of Systems see above     Objective:   Physical Exam Neck supple.  Chest clear to auscultation.  Cardiac exam regular rate and rhythm.  Abdomen soft nondistended without hepatosplenomegaly or masses.  Very mild periumbilical tenderness without rebound.  When she sits up from a lying position you can see a small periumbilical hernia and I am wondering if that is symptomatic for her at this time.       Assessment & Plan:  Periumbilical hernia that may be symptomatic.  Will ask surgeon to evaluate.  She has had laparoscopic surgeries in the remote past.  Hyperlipidemia-stable on current regimen  Fibromyalgia syndrome-stable with NSAIDs and Effexor.  GE reflux treated with Protonix  Controlled type 2 diabetes  mellitus  Primary insomnia treated with Ativan  Mixed hyperlipidemia treated with Crestor and fenofibrate  History of SVT and have maintained her on digoxin for a long period of time.  May not really be an issue at this point in time but we have not stopped the medication  Abrasion right leg treat with Bactroban and watch carefully for signs of secondary infection  Plan: Return in 6 months for physical examination.  Needs tetanus immunization update with Employee Health. See surgeon regarding periumbilical pain and hernia.

## 2018-01-16 ENCOUNTER — Other Ambulatory Visit: Payer: Self-pay | Admitting: Internal Medicine

## 2018-01-16 MED FILL — metFORMIN HCL 500 MG TABS: 500 | 90 days supply | Qty: 180 | Fill #0

## 2018-01-16 MED FILL — LORazepam 2 MG TABS: 2 | 90 days supply | Qty: 90 | Fill #1

## 2018-01-16 MED FILL — FENOFIBRATE 160 MG TABLET: 160 | 90 days supply | Qty: 90 | Fill #0

## 2018-01-16 MED FILL — PANTOPRAZOLE SOD DR 40 MG T: 40 | 90 days supply | Qty: 90 | Fill #1

## 2018-01-16 MED FILL — ACCU-CHEK FASTCLIX LANCETS: 51 days supply | Qty: 102 | Fill #0

## 2018-01-16 MED FILL — ACCU-CHEK GUIDE STRP: 50 days supply | Qty: 100 | Fill #0

## 2018-01-23 ENCOUNTER — Other Ambulatory Visit: Payer: Self-pay | Admitting: Internal Medicine

## 2018-01-23 MED FILL — DIGOXIN 250 MCG TAB: 250 | 90 days supply | Qty: 90 | Fill #0

## 2018-02-07 ENCOUNTER — Ambulatory Visit: Payer: Self-pay | Admitting: Surgery

## 2018-02-07 DIAGNOSIS — K429 Umbilical hernia without obstruction or gangrene: Secondary | ICD-10-CM | POA: Diagnosis not present

## 2018-02-07 NOTE — H&P (Signed)
History of Present Illness Jill Richardson. Jill Tsuda MD; 02/07/2018 4:51 PM) The patient is a 62 year old female who presents with an umbilical hernia. Referred by Dr. 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 This has become larger over the last couple of months and is causing significant discomfort. She denies any obstructive symptoms. Her job currently does not involve a lot of heavy lifting. However she would like to have this repaired because of the discomfort. She had previous laparoscopic surgery through a small infraumbilical incision.   Past Surgical History Jill Richardson; 02/07/2018 2:53 PM) Breast Biopsy  Left. Breast Mass; Local Excision  Left. Colon Polyp Removal - Colonoscopy  Sentinel Lymph Node Biopsy  Spinal Surgery - Lower Back   Diagnostic Studies History Jill Richardson; 02/07/2018 2:53 PM) Colonoscopy  within last year Mammogram  1-3 years ago  Allergies Jill Richardson; 02/07/2018 2:55 PM) Lodine *ANALGESICS - ANTI-INFLAMMATORY*  Jill Richardson Syndrome Lipitor *ANTIHYPERLIPIDEMICS*  Myalgia Allergies Reconciled   Medication History Jill Richardson; 02/07/2018 2:59 PM) LORazepam (2MG  Tablet, Oral) Active. Accu-Chek Guide (In Vitro) Active. Digoxin (250MCG Tablet, Oral) Active. Fenofibrate (160MG  Tablet, Oral) Active. Januvia (100MG  Tablet, Oral) Active. MetFORMIN HCl (500MG  Tablet, Oral) Active. Pantoprazole Sodium (40MG  Tablet DR, Oral) Active. Calcium /Vitamin D (Oral) Specific strength unknown - Active. Crestor (Oral) Specific strength unknown - Active. Daypro (600MG  Tablet, Oral) Active. Effexor XR (37.5MG  Capsule ER 24HR, Oral) Active. Protonix (40MG  Tablet DR, Oral) Active. Medications Reconciled  Social History Jill Richardson; 02/07/2018 2:53 PM) Alcohol  use  Occasional alcohol use. Caffeine use  Tea. No drug use  Tobacco use  Never smoker.  Family History Jill Richardson; 02/07/2018 2:53 PM) Arthritis  Brother, Father, Mother. Cancer  Mother. Diabetes Mellitus  Father. Heart Disease  Father. Hypertension  Father. Kidney Disease  Father.  Pregnancy / Birth History Jill Richardson; 02/07/2018 2:53 PM) Age at menarche  32 years. Age of menopause  48-50 Gravida  2 Length (months) of breastfeeding  7-12 Maternal age  4-35 Para  2  Other Problems Jill Richardson; 02/07/2018 2:53 PM) Back Pain  Breast Cancer  Diabetes Mellitus  Hypercholesterolemia  Lump In Breast  Oophorectomy     Review of Systems Jill Richardson; 02/07/2018 2:53 PM) Richardson Present- Night Sweats. Not Present- Appetite Loss, Chills, Fatigue, Fever, Weight Gain and Weight Loss. HEENT Present- Wears glasses/contact lenses. Not Present- Earache, Hearing Loss, Hoarseness, Nose Bleed, Oral Ulcers, Ringing in the Ears, Seasonal Allergies, Sinus Pain, Sore Throat, Visual Disturbances and Yellow Eyes. Respiratory Not Present- Bloody sputum, Chronic Cough, Difficulty Breathing, Snoring and Wheezing. Cardiovascular Present- Palpitations and Rapid Heart Rate. Not Present- Chest Pain, Difficulty Breathing Lying Down, Leg Cramps, Shortness of Breath and Swelling of Extremities. Gastrointestinal Present- Abdominal Pain. Not Present- Bloating, Bloody Stool, Change in Bowel Habits, Chronic diarrhea, Constipation, Difficulty Swallowing, Excessive gas, Gets full quickly at meals, Hemorrhoids, Indigestion, Nausea, Rectal Pain and Vomiting. Female Genitourinary Not Present- Frequency, Nocturia, Painful Urination, Pelvic Pain and Urgency. Musculoskeletal Present- Back Pain. Not Present- Joint Pain, Joint Stiffness, Muscle Pain, Muscle Weakness and Swelling of Extremities. Neurological Not Present- Decreased Memory, Fainting, Headaches, Numbness,  Seizures, Tingling, Tremor, Trouble walking and Weakness. Psychiatric Not Present- Anxiety, Bipolar, Change in Sleep Pattern, Depression, Fearful and Frequent crying. Endocrine Present- Hot flashes. Not Present- Cold Intolerance, Excessive Hunger, Hair Changes, Heat Intolerance and New  Diabetes. Hematology Not Present- Blood Thinners, Easy Bruising, Excessive bleeding, Gland problems, HIV and Persistent Infections.  Vitals Jill Richardson; 02/07/2018 3:00 PM) 02/07/2018 3:00 PM Weight: 154.6 lb Height: 62in Height was reported by patient. Body Surface Area: 1.71 m Body Mass Index: 28.28 kg/m  Temp.: 97.54F(Temporal)  Pulse: 64 (Regular)  BP: 152/86 (Sitting, Left Arm, Standard)       Physical Exam Jill Key K. Shatyra Becka MD; 02/07/2018 4:52 PM) The physical exam findings are as follows: Note:WDWN in NAD Eyes: Pupils equal, round; sclera anicteric HENT: Oral mucosa moist; good dentition Neck: No masses palpated, no thyromegaly Lungs: CTA bilaterally; normal respiratory effort CV: Regular rate and rhythm; no murmurs; extremities well-perfused with no edema Abd: +bowel sounds, soft, non-tender, no palpable organomegaly; palpable hernia above umbilicus - sac about 2 cm across, partially reducible Infraumbilical scar - no hernia palpated in this area. Skin: Warm, dry; no sign of jaundice Psychiatric - alert and oriented x 4; calm mood and affect    Assessment & Plan Jill Key K. Ashyra Cantin MD; 12/25/4763 4:65 PM) UMBILICAL HERNIA WITHOUT OBSTRUCTION OR GANGRENE (K42.9) Current Plans Schedule for Surgery - Umbilical hernia repair with possible mesh placement. The surgical procedure has been discussed with the patient. Potential risks, benefits, alternative treatments, and expected outcomes have been explained. All of the patient's questions at this time have been answered. The likelihood of reaching the patient's treatment goal is good. The patient understand the proposed  surgical procedure and wishes to proceed.  Jill Richardson. Jill Dover, MD, Jill Richardson Surgery  Richardson/ Trauma Surgery  02/07/2018 4:53 PM

## 2018-02-13 ENCOUNTER — Other Ambulatory Visit: Payer: Self-pay | Admitting: Internal Medicine

## 2018-02-13 MED FILL — OXAPROZIN 600 MG TABLET: 600 | 90 days supply | Qty: 180 | Fill #0

## 2018-02-20 MED FILL — VENLAFAXINE HCL ER 37.5 MG: 37.5 | 90 days supply | Qty: 90 | Fill #1

## 2018-03-14 ENCOUNTER — Other Ambulatory Visit: Payer: Self-pay

## 2018-03-14 ENCOUNTER — Encounter (HOSPITAL_BASED_OUTPATIENT_CLINIC_OR_DEPARTMENT_OTHER): Payer: Self-pay | Admitting: *Deleted

## 2018-03-16 ENCOUNTER — Encounter (HOSPITAL_BASED_OUTPATIENT_CLINIC_OR_DEPARTMENT_OTHER)
Admission: RE | Admit: 2018-03-16 | Discharge: 2018-03-16 | Disposition: A | Discharge status: Home / Self Care | Payer: 59 | Source: Ambulatory Visit | Attending: Surgery | Admitting: Surgery

## 2018-03-16 DIAGNOSIS — Z7984 Long term (current) use of oral hypoglycemic drugs: Secondary | ICD-10-CM | POA: Diagnosis not present

## 2018-03-16 DIAGNOSIS — Z886 Allergy status to analgesic agent status: Secondary | ICD-10-CM | POA: Diagnosis not present

## 2018-03-16 DIAGNOSIS — K429 Umbilical hernia without obstruction or gangrene: Secondary | ICD-10-CM | POA: Diagnosis not present

## 2018-03-16 DIAGNOSIS — E119 Type 2 diabetes mellitus without complications: Secondary | ICD-10-CM | POA: Diagnosis not present

## 2018-03-16 DIAGNOSIS — E7800 Pure hypercholesterolemia, unspecified: Secondary | ICD-10-CM | POA: Diagnosis not present

## 2018-03-16 DIAGNOSIS — Z79899 Other long term (current) drug therapy: Secondary | ICD-10-CM | POA: Diagnosis not present

## 2018-03-16 LAB — BASIC METABOLIC PANEL
ANION GAP: 11 (ref 5–15)
BUN: 23 mg/dL (ref 8–23)
CHLORIDE: 104 mmol/L (ref 98–111)
CO2: 24 mmol/L (ref 22–32)
Calcium: 9.3 mg/dL (ref 8.9–10.3)
Creatinine, Ser: 0.94 mg/dL (ref 0.44–1.00)
GFR calc non Af Amer: >60 mL/min (ref >60)
Glucose, Bld: 89 mg/dL (ref 70–99)
Potassium: 4.8 mmol/L (ref 3.5–5.1)
Sodium: 139 mmol/L (ref 135–145)

## 2018-03-16 NOTE — Progress Notes (Addendum)
Ensure pre surgery drink given with instructions to complete by [place], surgical soap given with instructions, pt verbalized understanding. Reminded pt to take her Digoxin and Effexor day of surgery with sip of water.

## 2018-03-20 MED FILL — JANUVIA 100 MG TABLET: 100 | 90 days supply | Qty: 90 | Fill #3

## 2018-03-20 NOTE — Anesthesia Preprocedure Evaluation (Addendum)
Anesthesia Evaluation  Patient identified by MRN, date of birth, ID band Patient awake    Reviewed: Allergy & Precautions, NPO status , Patient's Chart, lab work & pertinent test results  History of Anesthesia Complications (+) PONV and history of anesthetic complications  Airway Mallampati: II  TM Distance: >3 FB Neck ROM: Full    Dental  (+) Teeth Intact, Dental Advisory Given   Pulmonary neg pulmonary ROS,    Pulmonary exam normal breath sounds clear to auscultation       Cardiovascular hypertension, Pt. on medications Normal cardiovascular exam+ dysrhythmias (on digoxin) Supra Ventricular Tachycardia  Rhythm:Regular Rate:Normal  HLD   Neuro/Psych PSYCHIATRIC DISORDERS Anxiety Depression negative neurological ROS     GI/Hepatic Neg liver ROS, GERD  Medicated and Controlled,Umbilical hernia    Endo/Other  diabetes, Type 2, Oral Hypoglycemic Agents  Renal/GU negative Renal ROS     Musculoskeletal  (+) Arthritis , Fibromyalgia -  Abdominal   Peds  Hematology negative hematology ROS (+)   Anesthesia Other Findings Day of surgery medications reviewed with the patient.   Reproductive/Obstetrics                            Anesthesia Physical Anesthesia Plan  ASA: III  Anesthesia Plan: General   Post-op Pain Management:    Induction: Intravenous  PONV Risk Score and Plan: 4 or greater and Ondansetron, Dexamethasone, Midazolam, Scopolamine patch - Pre-op and Diphenhydramine  Airway Management Planned: Oral ETT  Additional Equipment:   Intra-op Plan:   Post-operative Plan: Extubation in OR  Informed Consent: I have reviewed the patients History and Physical, chart, labs and discussed the procedure including the risks, benefits and alternatives for the proposed anesthesia with the patient or authorized representative who has indicated his/her understanding and acceptance.    Dental advisory given  Plan Discussed with: CRNA  Anesthesia Plan Comments:        Anesthesia Quick Evaluation

## 2018-03-21 ENCOUNTER — Encounter (HOSPITAL_BASED_OUTPATIENT_CLINIC_OR_DEPARTMENT_OTHER): Payer: Self-pay | Admitting: *Deleted

## 2018-03-21 ENCOUNTER — Ambulatory Visit (HOSPITAL_BASED_OUTPATIENT_CLINIC_OR_DEPARTMENT_OTHER): Payer: 59 | Admitting: Anesthesiology

## 2018-03-21 ENCOUNTER — Ambulatory Visit (HOSPITAL_BASED_OUTPATIENT_CLINIC_OR_DEPARTMENT_OTHER)
Admission: RE | Admit: 2018-03-21 | Discharge: 2018-03-21 | Disposition: A | Discharge status: Home / Self Care | Payer: 59 | Source: Ambulatory Visit | Attending: Surgery | Admitting: Surgery

## 2018-03-21 ENCOUNTER — Other Ambulatory Visit: Payer: Self-pay

## 2018-03-21 ENCOUNTER — Encounter (HOSPITAL_BASED_OUTPATIENT_CLINIC_OR_DEPARTMENT_OTHER)
Admission: RE | Disposition: A | Discharge status: Home / Self Care | Payer: Self-pay | Source: Ambulatory Visit | Attending: Surgery

## 2018-03-21 DIAGNOSIS — Z886 Allergy status to analgesic agent status: Secondary | ICD-10-CM | POA: Insufficient documentation

## 2018-03-21 DIAGNOSIS — K429 Umbilical hernia without obstruction or gangrene: Secondary | ICD-10-CM | POA: Insufficient documentation

## 2018-03-21 DIAGNOSIS — Z79899 Other long term (current) drug therapy: Secondary | ICD-10-CM | POA: Insufficient documentation

## 2018-03-21 DIAGNOSIS — E7800 Pure hypercholesterolemia, unspecified: Secondary | ICD-10-CM | POA: Diagnosis not present

## 2018-03-21 DIAGNOSIS — Z7984 Long term (current) use of oral hypoglycemic drugs: Secondary | ICD-10-CM | POA: Insufficient documentation

## 2018-03-21 DIAGNOSIS — E119 Type 2 diabetes mellitus without complications: Secondary | ICD-10-CM | POA: Diagnosis not present

## 2018-03-21 HISTORY — PX: INSERTION OF MESH: SHX5868

## 2018-03-21 HISTORY — DX: Other specified postprocedural states: Z98.890

## 2018-03-21 HISTORY — PX: UMBILICAL HERNIA REPAIR: SHX196

## 2018-03-21 HISTORY — DX: Cardiac arrhythmia, unspecified: I49.9

## 2018-03-21 HISTORY — DX: Adverse effect of unspecified anesthetic, initial encounter: T41.45XA

## 2018-03-21 HISTORY — DX: Umbilical hernia without obstruction or gangrene: K42.9

## 2018-03-21 HISTORY — DX: Type 2 diabetes mellitus without complications: E11.9

## 2018-03-21 HISTORY — DX: Other complications of anesthesia, initial encounter: T88.59XA

## 2018-03-21 HISTORY — DX: Nausea with vomiting, unspecified: R11.2

## 2018-03-21 LAB — GLUCOSE, CAPILLARY: Glucose-Capillary: 84 mg/dL (ref 70–99)

## 2018-03-21 SURGERY — REPAIR, HERNIA, UMBILICAL, ADULT
Anesthesia: General | Site: Abdomen

## 2018-03-21 MED ORDER — EPHEDRINE 5 MG/ML INJ
INTRAVENOUS | Status: AC
  Filled 2018-03-21: qty 10

## 2018-03-21 MED ORDER — MIDAZOLAM HCL 2 MG/2ML IJ SOLN
INTRAMUSCULAR | Status: DC | PRN
  Administered 2018-03-21: 2 mg via INTRAVENOUS

## 2018-03-21 MED ORDER — DEXAMETHASONE SODIUM PHOSPHATE 10 MG/ML IJ SOLN
INTRAMUSCULAR | Status: DC | PRN
  Administered 2018-03-21: 5 mg via INTRAVENOUS

## 2018-03-21 MED ORDER — ACETAMINOPHEN 500 MG PO TABS
ORAL_TABLET | ORAL | Status: AC
  Filled 2018-03-21: qty 2

## 2018-03-21 MED ORDER — SCOPOLAMINE 1 MG/3DAYS TD PT72
MEDICATED_PATCH | TRANSDERMAL | Status: AC
  Filled 2018-03-21: qty 1

## 2018-03-21 MED ORDER — ACETAMINOPHEN 500 MG PO TABS
1000.0000 mg | ORAL_TABLET | ORAL | Status: AC
  Administered 2018-03-21: 1000 mg via ORAL

## 2018-03-21 MED ORDER — MIDAZOLAM HCL 2 MG/2ML IJ SOLN
INTRAMUSCULAR | Status: AC
  Filled 2018-03-21: qty 2

## 2018-03-21 MED ORDER — MIDAZOLAM HCL 2 MG/2ML IJ SOLN: 1.0000 mg | INTRAMUSCULAR | Status: DC | PRN

## 2018-03-21 MED ORDER — DIPHENHYDRAMINE HCL 50 MG/ML IJ SOLN
INTRAMUSCULAR | Status: AC
  Filled 2018-03-21: qty 1

## 2018-03-21 MED ORDER — CHLORHEXIDINE GLUCONATE CLOTH 2 % EX PADS: 6.0000 | MEDICATED_PAD | Freq: Once | CUTANEOUS | Status: DC

## 2018-03-21 MED ORDER — PROMETHAZINE HCL 25 MG/ML IJ SOLN: 6.2500 mg | INTRAMUSCULAR | Status: DC | PRN

## 2018-03-21 MED ORDER — DEXAMETHASONE SODIUM PHOSPHATE 10 MG/ML IJ SOLN
INTRAMUSCULAR | Status: AC
  Filled 2018-03-21: qty 1

## 2018-03-21 MED ORDER — EPHEDRINE SULFATE-NACL 50-0.9 MG/10ML-% IV SOSY
PREFILLED_SYRINGE | INTRAVENOUS | Status: DC | PRN
  Administered 2018-03-21: 5 mg via INTRAVENOUS

## 2018-03-21 MED ORDER — GABAPENTIN 300 MG PO CAPS
300.0000 mg | ORAL_CAPSULE | ORAL | Status: AC
  Administered 2018-03-21: 300 mg via ORAL

## 2018-03-21 MED ORDER — GABAPENTIN 300 MG PO CAPS
ORAL_CAPSULE | ORAL | Status: AC
  Filled 2018-03-21: qty 1

## 2018-03-21 MED ORDER — LIDOCAINE 2% (20 MG/ML) 5 ML SYRINGE
INTRAMUSCULAR | Status: AC
  Filled 2018-03-21: qty 5

## 2018-03-21 MED ORDER — PROPOFOL 10 MG/ML IV BOLUS
INTRAVENOUS | Status: DC | PRN
  Administered 2018-03-21: 150 mg via INTRAVENOUS

## 2018-03-21 MED ORDER — LACTATED RINGERS IV SOLN
INTRAVENOUS | Status: DC
  Administered 2018-03-21: 07:00:00 via INTRAVENOUS

## 2018-03-21 MED ORDER — DIPHENHYDRAMINE HCL 50 MG/ML IJ SOLN
INTRAMUSCULAR | Status: DC | PRN
  Administered 2018-03-21: 12.5 mg via INTRAVENOUS

## 2018-03-21 MED ORDER — CEFAZOLIN SODIUM-DEXTROSE 2-4 GM/100ML-% IV SOLN
2.0000 g | INTRAVENOUS | Status: AC
  Administered 2018-03-21: 2 g via INTRAVENOUS

## 2018-03-21 MED ORDER — FENTANYL CITRATE (PF) 100 MCG/2ML IJ SOLN: 25.0000 ug | INTRAMUSCULAR | Status: DC | PRN

## 2018-03-21 MED ORDER — ROCURONIUM BROMIDE 50 MG/5ML IV SOSY
PREFILLED_SYRINGE | INTRAVENOUS | Status: AC
  Filled 2018-03-21: qty 5

## 2018-03-21 MED ORDER — ONDANSETRON HCL 4 MG/2ML IJ SOLN
INTRAMUSCULAR | Status: DC | PRN
  Administered 2018-03-21: 4 mg via INTRAVENOUS

## 2018-03-21 MED ORDER — HYDROCODONE-ACETAMINOPHEN 5-325 MG PO TABS
ORAL_TABLET | ORAL | Status: AC
  Filled 2018-03-21: qty 1

## 2018-03-21 MED ORDER — LIDOCAINE HCL (CARDIAC) PF 100 MG/5ML IV SOSY
PREFILLED_SYRINGE | INTRAVENOUS | Status: DC | PRN
  Administered 2018-03-21: 80 mg via INTRAVENOUS

## 2018-03-21 MED ORDER — FENTANYL CITRATE (PF) 100 MCG/2ML IJ SOLN
INTRAMUSCULAR | Status: AC
  Filled 2018-03-21: qty 2

## 2018-03-21 MED ORDER — SCOPOLAMINE 1 MG/3DAYS TD PT72
1.0000 | MEDICATED_PATCH | Freq: Once | TRANSDERMAL | Status: AC | PRN
  Administered 2018-03-21: 1 via TRANSDERMAL

## 2018-03-21 MED ORDER — ONDANSETRON HCL 4 MG/2ML IJ SOLN
INTRAMUSCULAR | Status: AC
  Filled 2018-03-21: qty 2

## 2018-03-21 MED ORDER — HYDROCODONE-ACETAMINOPHEN 5-325 MG PO TABS
1.0000 | ORAL_TABLET | Freq: Once | ORAL | Status: AC
  Administered 2018-03-21: 1 via ORAL

## 2018-03-21 MED ORDER — FENTANYL CITRATE (PF) 100 MCG/2ML IJ SOLN
INTRAMUSCULAR | Status: DC | PRN
  Administered 2018-03-21: 50 ug via INTRAVENOUS

## 2018-03-21 MED ORDER — CEFAZOLIN SODIUM-DEXTROSE 2-4 GM/100ML-% IV SOLN
INTRAVENOUS | Status: AC
  Filled 2018-03-21: qty 100

## 2018-03-21 MED ORDER — BUPIVACAINE-EPINEPHRINE 0.25% -1:200000 IJ SOLN
INTRAMUSCULAR | Status: DC | PRN
  Administered 2018-03-21: 6 mL

## 2018-03-21 MED ORDER — HYDROCODONE-ACETAMINOPHEN 5-325 MG PO TABS: 1.0000 | ORAL_TABLET | Freq: Four times a day (QID) | ORAL | 0 refills | Status: DC | PRN

## 2018-03-21 MED ORDER — FENTANYL CITRATE (PF) 100 MCG/2ML IJ SOLN: 50.0000 ug | INTRAMUSCULAR | Status: DC | PRN

## 2018-03-21 MED FILL — HYDROCODON-APAP 5-325: 5-325 | 5 days supply | Qty: 20 | Fill #0

## 2018-03-21 SURGICAL SUPPLY — 65 items
APL SKNCLS STERI-STRIP NONHPOA (GAUZE/BANDAGES/DRESSINGS) ×1
APL SWBSTK 6 STRL LF DISP (MISCELLANEOUS)
APPLICATOR COTTON TIP 6 STRL (MISCELLANEOUS) IMPLANT
APPLICATOR COTTON TIP 6IN STRL (MISCELLANEOUS)
BENZOIN TINCTURE PRP APPL 2/3 (GAUZE/BANDAGES/DRESSINGS) ×3 IMPLANT
BLADE CLIPPER SURG (BLADE) ×3 IMPLANT
BLADE HEX COATED 2.75 (ELECTRODE) ×3 IMPLANT
BLADE SURG 15 STRL LF DISP TIS (BLADE) ×1 IMPLANT
BLADE SURG 15 STRL SS (BLADE) ×3
CANISTER SUCT 1200ML W/VALVE (MISCELLANEOUS) IMPLANT
CHLORAPREP W/TINT 26ML (MISCELLANEOUS) ×3 IMPLANT
CLOSURE WOUND 1/2 X4 (GAUZE/BANDAGES/DRESSINGS) ×1
COVER BACK TABLE 60X90IN (DRAPES) ×3 IMPLANT
COVER MAYO STAND STRL (DRAPES) ×3 IMPLANT
DECANTER SPIKE VIAL GLASS SM (MISCELLANEOUS) IMPLANT
DRAPE LAPAROTOMY T 102X78X121 (DRAPES) ×3 IMPLANT
DRAPE UTILITY XL STRL (DRAPES) ×3 IMPLANT
DRSG TEGADERM 4X4.75 (GAUZE/BANDAGES/DRESSINGS) ×3 IMPLANT
ELECT REM PT RETURN 9FT ADLT (ELECTROSURGICAL) ×3
ELECTRODE REM PT RTRN 9FT ADLT (ELECTROSURGICAL) ×1 IMPLANT
GAUZE SPONGE 4X4 12PLY STRL LF (GAUZE/BANDAGES/DRESSINGS) IMPLANT
GLOVE BIO SURGEON STRL SZ 6.5 (GLOVE) ×1 IMPLANT
GLOVE BIO SURGEON STRL SZ7 (GLOVE) ×3 IMPLANT
GLOVE BIO SURGEONS STRL SZ 6.5 (GLOVE) ×1
GLOVE BIOGEL PI IND STRL 7.0 (GLOVE) IMPLANT
GLOVE BIOGEL PI IND STRL 7.5 (GLOVE) ×1 IMPLANT
GLOVE BIOGEL PI INDICATOR 7.0 (GLOVE) ×4
GLOVE BIOGEL PI INDICATOR 7.5 (GLOVE) ×2
GOWN STRL REUS W/ TWL LRG LVL3 (GOWN DISPOSABLE) ×2 IMPLANT
GOWN STRL REUS W/TWL LRG LVL3 (GOWN DISPOSABLE) ×6
MESH VENTRALEX ST 1-7/10 CRC S (Mesh General) ×2 IMPLANT
NDL HYPO 25X1 1.5 SAFETY (NEEDLE) ×1 IMPLANT
NDL SAFETY ECLIPSE 18X1.5 (NEEDLE) IMPLANT
NEEDLE HYPO 18GX1.5 SHARP (NEEDLE)
NEEDLE HYPO 25X1 1.5 SAFETY (NEEDLE) ×3 IMPLANT
NS IRRIG 1000ML POUR BTL (IV SOLUTION) IMPLANT
PACK BASIN DAY SURGERY FS (CUSTOM PROCEDURE TRAY) ×3 IMPLANT
PENCIL BUTTON HOLSTER BLD 10FT (ELECTRODE) ×3 IMPLANT
SLEEVE SCD COMPRESS KNEE MED (MISCELLANEOUS) ×3 IMPLANT
SPONGE GAUZE 2X2 8PLY STER LF (GAUZE/BANDAGES/DRESSINGS) ×1
SPONGE GAUZE 2X2 8PLY STRL LF (GAUZE/BANDAGES/DRESSINGS) ×2 IMPLANT
STAPLER VISISTAT 35W (STAPLE) IMPLANT
STRIP CLOSURE SKIN 1/2X4 (GAUZE/BANDAGES/DRESSINGS) ×2 IMPLANT
SUT MNCRL AB 4-0 PS2 18 (SUTURE) ×3 IMPLANT
SUT NOVA 0 T19/GS 22DT (SUTURE) ×4 IMPLANT
SUT NOVA NAB DX-16 0-1 5-0 T12 (SUTURE) IMPLANT
SUT NOVA NAB GS-21 1 T12 (SUTURE) IMPLANT
SUT PDS AB 0 CT 36 (SUTURE) IMPLANT
SUT PROLENE 0 CT 2 (SUTURE) IMPLANT
SUT SILK 3 0 TIES 17X18 (SUTURE)
SUT SILK 3-0 18XBRD TIE BLK (SUTURE) IMPLANT
SUT VIC AB 2-0 CT1 27 (SUTURE)
SUT VIC AB 2-0 CT1 TAPERPNT 27 (SUTURE) IMPLANT
SUT VIC AB 3-0 SH 27 (SUTURE) ×3
SUT VIC AB 3-0 SH 27X BRD (SUTURE) ×1 IMPLANT
SUT VIC AB 4-0 BRD 54 (SUTURE) IMPLANT
SUT VIC AB 4-0 SH 18 (SUTURE) IMPLANT
SUT VICRYL 4-0 PS2 18IN ABS (SUTURE) IMPLANT
SYR BULB 3OZ (MISCELLANEOUS) IMPLANT
SYR CONTROL 10ML LL (SYRINGE) ×3 IMPLANT
TOWEL GREEN STERILE FF (TOWEL DISPOSABLE) ×3 IMPLANT
TOWEL OR NON WOVEN STRL DISP B (DISPOSABLE) ×3 IMPLANT
TUBE CONNECTING 20'X1/4 (TUBING)
TUBE CONNECTING 20X1/4 (TUBING) IMPLANT
YANKAUER SUCT BULB TIP NO VENT (SUCTIONS) IMPLANT

## 2018-03-21 NOTE — Anesthesia Procedure Notes (Signed)
Procedure Name: LMA Insertion Date/Time: 03/21/2018 7:26 AM Performed by: Raenette Rover, CRNA Pre-anesthesia Checklist: Patient identified, Emergency Drugs available, Suction available and Patient being monitored Patient Re-evaluated:Patient Re-evaluated prior to induction Oxygen Delivery Method: Circle system utilized Preoxygenation: Pre-oxygenation with 100% oxygen Induction Type: IV induction LMA: LMA inserted LMA Size: 4.0 Number of attempts: 1 Placement Confirmation: positive ETCO2,  CO2 detector and breath sounds checked- equal and bilateral Tube secured with: Tape Dental Injury: Teeth and Oropharynx as per pre-operative assessment

## 2018-03-21 NOTE — H&P (Signed)
History of Present Illness The patient is a 62 year old female who presents with an umbilical hernia. Referred by Dr. 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 This has become larger over the last couple of months and is causing significant discomfort. She denies any obstructive symptoms. Her job currently does not involve a lot of heavy lifting. However she would like to have this repaired because of the discomfort. She had previous laparoscopic surgery through a small infraumbilical incision.   Past Surgical History Breast Biopsy  Left. Breast Mass; Local Excision  Left. Colon Polyp Removal - Colonoscopy  Sentinel Lymph Node Biopsy  Spinal Surgery - Lower Back   Diagnostic Studies History  Colonoscopy  within last year Mammogram  1-3 years ago  Allergies  Lodine *ANALGESICS - ANTI-INFLAMMATORY*  [name] Lipitor *ANTIHYPERLIPIDEMICS*  Myalgia Allergies Reconciled   Medication History  LORazepam (2MG  Tablet, Oral) Active. Accu-Chek Guide (In Vitro) Active. Digoxin (250MCG Tablet, Oral) Active. Fenofibrate (160MG  Tablet, Oral) Active. Januvia (100MG  Tablet, Oral) Active. MetFORMIN HCl (500MG  Tablet, Oral) Active. Pantoprazole Sodium (40MG  Tablet DR, Oral) Active. Calcium /Vitamin D (Oral) Specific strength unknown - Active. Crestor (Oral) Specific strength unknown - Active. Daypro (600MG  Tablet, Oral) Active. Effexor XR (37.5MG  Capsule ER 24HR, Oral) Active. Protonix (40MG  Tablet DR, Oral) Active. Medications Reconciled  Social History  Alcohol use  Occasional alcohol use. Caffeine use  Tea. No drug use  Tobacco use  Never smoker.  Family History  Arthritis  Brother, Father, Mother. Cancer  Mother. Diabetes Mellitus  Father. Heart Disease   Father. Hypertension  Father. Kidney Disease  Father.  Pregnancy / Birth History  Age at menarche  17 years. Age of menopause  39-50 Gravida  2 Length (months) of breastfeeding  7-12 Maternal age  38-35 Para  2  Other Problems  Back Pain  Breast Cancer  Diabetes Mellitus  Hypercholesterolemia  Lump In Breast  Oophorectomy     Review of Systems  General Present- Night Sweats. Not Present- Appetite Loss, Chills, Fatigue, Fever, Weight Gain and Weight Loss. HEENT Present- Wears glasses/contact lenses. Not Present- Earache, Hearing Loss, Hoarseness, Nose Bleed, Oral Ulcers, Ringing in the Ears, Seasonal Allergies, Sinus Pain, Sore Throat, Visual Disturbances and Yellow Eyes. Respiratory Not Present- Bloody sputum, Chronic Cough, Difficulty Breathing, Snoring and Wheezing. Cardiovascular Present- Palpitations and Rapid Heart Rate. Not Present- Chest Pain, Difficulty Breathing Lying Down, Leg Cramps, Shortness of Breath and Swelling of Extremities. Gastrointestinal Present- Abdominal Pain. Not Present- Bloating, Bloody Stool, Change in Bowel Habits, Chronic diarrhea, Constipation, Difficulty Swallowing, Excessive gas, Gets full quickly at meals, Hemorrhoids, Indigestion, Nausea, Rectal Pain and Vomiting. Female Genitourinary Not Present- Frequency, Nocturia, Painful Urination, Pelvic Pain and Urgency. Musculoskeletal Present- Back Pain. Not Present- Joint Pain, Joint Stiffness, Muscle Pain, Muscle Weakness and Swelling of Extremities. Neurological Not Present- Decreased Memory, Fainting, Headaches, Numbness, Seizures, Tingling, Tremor, Trouble walking and Weakness. Psychiatric Not Present- Anxiety, Bipolar, Change in Sleep Pattern, Depression, Fearful and Frequent crying. Endocrine Present- Hot flashes. Not Present- Cold Intolerance, Excessive Hunger, Hair Changes, Heat Intolerance and New Diabetes. Hematology Not Present- Blood Thinners, Easy Bruising, Excessive  bleeding, Gland problems, HIV and Persistent Infections.  Vitals Weight: 154.6 lb Height: 62in Height was reported by patient. Body Surface Area: 1.71 m Body Mass Index: 28.28 kg/m  Temp.: 97.57F(Temporal)  Pulse: 64 (  Regular)  BP: 152/86 (Sitting, Left Arm, Standard)       Physical Exam  The physical exam findings are as follows: Note:WDWN in NAD Eyes: Pupils equal, round; sclera anicteric HENT: Oral mucosa moist; good dentition Neck: No masses palpated, no thyromegaly Lungs: CTA bilaterally; normal respiratory effort CV: Regular rate and rhythm; no murmurs; extremities well-perfused with no edema Abd: +bowel sounds, soft, non-tender, no palpable organomegaly; palpable hernia above umbilicus - sac about 2 cm across, partially reducible Infraumbilical scar - no hernia palpated in this area. Skin: Warm, dry; no sign of jaundice Psychiatric - alert and oriented x 4; calm mood and affect    Assessment & Plan  UMBILICAL HERNIA WITHOUT OBSTRUCTION OR GANGRENE (K42.9) Current Plans Schedule for Surgery - Umbilical hernia repair with possible mesh placement. The surgical procedure has been discussed with the patient. Potential risks, benefits, alternative treatments, and expected outcomes have been explained. All of the patient's questions at this time have been answered. The likelihood of reaching the patient's treatment goal is good. The patient understand the proposed surgical procedure and wishes to proceed.  Jill Richardson. Jill Dover, MD, [facility] Surgery  General/ Trauma Surgery Beeper [phone]  03/21/2018 7:12 AM

## 2018-03-21 NOTE — Op Note (Signed)
Indications:  The patient presented with a history of an enlarging umbilical hernia just above the umbilicus.  The patient was examined and we recommended umbilical hernia repair with mesh.  Pre-operative diagnosis:  Umbilical hernia  Post-operative diagnosis:  Same  Procedure:  Umbilical hernia repair with mesh  Procedure Details  The patient was seen again in the Holding Room. The risks, benefits, complications, treatment options, and expected outcomes were discussed with the patient. The possibilities of reaction to medication, pulmonary aspiration, perforation of viscus, bleeding, recurrent infection, the need for additional procedures, and development of a complication requiring transfusion or further operation were discussed with the patient and/or family. There was concurrence with the proposed plan, and informed consent was obtained. The site of surgery was properly noted/marked. The patient was taken to the Operating Room, identified as Jill Richardson, and the procedure verified as umbilical hernia repair. A Time Out was held and the above information confirmed.  After an adequate level of general anesthesia was obtained, the patient's abdomen was prepped with Chloraprep and draped in sterile fashion.  We made a transverse incision above the umbilicus.  Dissection was carried down to the hernia sac with cautery.  We dissected bluntly around the hernia sac down to the edge of the fascial defect.  The hernia sac contained a small amount of omentum.  The fascial defect measured 1 cm.  We cleared the fascia in all directions.  A small Ventralex mesh was inserted into the pre-peritoneal space and was deployed.  The mesh was secured with four trans-fascial sutures of 0 Novofil.  The fascial defect was closed with multiple interrupted figure-of-eight 0 Novofil sutures.  The base of the umbilicus was tacked down with 3-0 Vicryl.  3-0 Vicryl was used to close the subcutaneous tissues and 4-0 Monocryl was  used to close the skin.  Steri-strips and clean dressing were applied.  The patient was extubated and brought to the recovery room in stable condition.  All sponge, instrument, and needle counts were correct prior to closure and at the conclusion of the case.   Estimated Blood Loss: Minimal          Complications: None; patient tolerated the procedure well.         Disposition: PACU - hemodynamically stable.         Condition: stable  Jill Richardson. Jill Dover, MD, [facility]  General/ Trauma Surgery Beeper [phone]  03/21/2018 8:06 AM

## 2018-03-21 NOTE — Discharge Instructions (Signed)
[facility], [place]  UMBILICAL HERNIA REPAIR: POST OP INSTRUCTIONS  Always review your discharge instruction sheet given to you by the facility where your surgery was performed. IF YOU HAVE DISABILITY OR FAMILY LEAVE FORMS, YOU MUST BRING THEM TO THE OFFICE FOR PROCESSING.   DO NOT GIVE THEM TO YOUR DOCTOR.  1. A  prescription for pain medication may be given to you upon discharge.  Take your pain medication as prescribed, if needed.  If narcotic pain medicine is not needed, then you may take acetaminophen (Tylenol) or ibuprofen (Advil) as needed. 2. Take your usually prescribed medications unless otherwise directed. 3. If you need a refill on your pain medication, please contact your pharmacy.  They will contact our office to request authorization. Prescriptions will not be filled after 5 pm or on week-ends. 4. You should follow a light diet the first 24 hours after arrival home, such as soup and crackers, etc.  Be sure to include lots of fluids daily.  Resume your normal diet the day after surgery. 5. Most patients will experience some swelling and bruising around the umbilicus.  Ice packs and reclining will help.  Swelling and bruising can take several days to resolve.  6. It is common to experience some constipation if taking pain medication after surgery.  Increasing fluid intake and taking a stool softener (such as Colace) will usually help or prevent this problem from occurring.  A mild laxative (Milk of Magnesia or Miralax) should be taken according to package directions if there are no bowel movements after 48 hours. 7. Unless discharge instructions indicate otherwise, you may remove your bandages 48 hours after surgery, and you may shower at that time.  You will have steri-strips (small skin tapes) in place directly over the incision.  These strips should be left on the skin for 7-10 days. 8. ACTIVITIES:  You may resume regular (light) daily activities beginning the next day--such  as daily self-care, walking, climbing stairs--gradually increasing activities as tolerated.  You may have sexual intercourse when it is comfortable.  Refrain from any heavy lifting or straining until approved by your doctor. a. You may drive when you are no longer taking prescription pain medication, you can comfortably wear a seatbelt, and you can safely maneuver your car and apply brakes. b. RETURN TO WORK:  2-3 weeks with light duty - no lifting over 15 lbs. 9. You should see your doctor in the office for a follow-up appointment approximately 2-3 weeks after your surgery.  Make sure that you call for this appointment within a day or two after you arrive home to insure a convenient appointment time. 10. OTHER INSTRUCTIONS:  __________________________________________________________________________________________________________________________________________________________________________________________  WHEN TO CALL YOUR DOCTOR: 1. Fever over 101.0 2. Inability to urinate 3. Nausea and/or vomiting 4. Extreme swelling or bruising 5. Continued bleeding from incision. 6. Increased pain, redness, or drainage from the incision  The clinic staff is available to answer your questions during regular business hours.  Please dont hesitate to call and ask to speak to one of the nurses for clinical concerns.  If you have a medical emergency, go to the nearest emergency room or call 911.  A surgeon from [facility] is always on call at the hospital   [address] [place], [place], [place]  [address] ?  [address], [place], [place]   [address] [phone]    FAX [phone] Web site: [email or web address]     Post Anesthesia  Home Care Instructions  Activity: Get plenty of rest for the remainder of the day. A responsible individual must stay with you for 24 hours following the procedure.  For the next 24 hours, DO NOT: -Drive a car -Conservation officer, nature -Drink alcoholic beverages -Take any medication unless instructed by your physician -Make any legal decisions or sign important papers.  Meals: Start with liquid foods such as gelatin or soup. Progress to regular foods as tolerated. Avoid greasy, spicy, heavy foods. If nausea and/or vomiting occur, drink only clear liquids until the nausea and/or vomiting subsides. Call your physician if vomiting continues.  Special Instructions/Symptoms: Your throat may feel dry or sore from the anesthesia or the breathing tube placed in your throat during surgery. If this causes discomfort, gargle with warm salt water. The discomfort should disappear within 24 hours.  If you had a scopolamine patch placed behind your ear for the management of post- operative nausea and/or vomiting:  1. The medication in the patch is effective for 72 hours, after which it should be removed.  Wrap patch in a tissue and discard in the trash. Wash hands thoroughly with soap and water. 2. You may remove the patch earlier than 72 hours if you experience unpleasant side effects which may include dry mouth, dizziness or visual disturbances. 3. Avoid touching the patch. Wash your hands with soap and water after contact with the patch.

## 2018-03-21 NOTE — Anesthesia Postprocedure Evaluation (Signed)
Anesthesia Post Note  Patient: DEZAREE TRACEY  Procedure(s) Performed: HERNIA REPAIR UMBILICAL ADULT WITH POSSIBLE MESH (N/A Abdomen) INSERTION OF MESH (N/A Abdomen)     Patient location during evaluation: PACU Anesthesia Type: General Level of consciousness: awake and alert, awake and oriented Pain management: pain level controlled Vital Signs Assessment: post-procedure vital signs reviewed and stable Respiratory status: spontaneous breathing, nonlabored ventilation and respiratory function stable Cardiovascular status: blood pressure returned to baseline and stable Postop Assessment: no apparent nausea or vomiting Anesthetic complications: no    Last Vitals:  Vitals:   03/21/18 0830 03/21/18 0908  BP: 129/76 (!) 153/89  Pulse: 67 95  Resp: 12 18  Temp:  36.7 C  SpO2: 96% 98%    Last Pain:  Vitals:   03/21/18 0908  TempSrc:   PainSc: 4                  Catalina Gravel

## 2018-03-21 NOTE — Transfer of Care (Signed)
Immediate Anesthesia Transfer of Care Note  Patient: Jill Richardson  Procedure(s) Performed: HERNIA REPAIR UMBILICAL ADULT WITH POSSIBLE MESH (N/A Abdomen) INSERTION OF MESH (N/A Abdomen)  Patient Location: PACU  Anesthesia Type:General  Level of Consciousness: awake, alert , oriented, drowsy and patient cooperative  Airway & Oxygen Therapy: Patient Spontanous Breathing and Patient connected to face mask oxygen  Post-op Assessment: Report given to RN and Post -op Vital signs reviewed and stable  Post vital signs: Reviewed and stable  Last Vitals:  Vitals Value Taken Time  BP 132/69 03/21/2018  8:07 AM  Temp    Pulse 70 03/21/2018  8:09 AM  Resp 15 03/21/2018  8:09 AM  SpO2 100 % 03/21/2018  8:09 AM  Vitals shown include unvalidated device data.  Last Pain:  Vitals:   03/21/18 3582  TempSrc: Oral  PainSc: 0-No pain         Complications: No apparent anesthesia complications

## 2018-03-22 ENCOUNTER — Encounter (HOSPITAL_BASED_OUTPATIENT_CLINIC_OR_DEPARTMENT_OTHER): Payer: Self-pay | Admitting: Surgery

## 2018-04-04 MED FILL — RAMIPRIL 2.5 MG CAPSULE: 2.5 | 90 days supply | Qty: 90 | Fill #1

## 2018-04-12 DIAGNOSIS — H524 Presbyopia: Secondary | ICD-10-CM | POA: Diagnosis not present

## 2018-04-19 MED FILL — PANTOPRAZOLE SOD DR 40 MG T: 40 | 90 days supply | Qty: 90 | Fill #2

## 2018-04-19 MED FILL — DIGOXIN 250 MCG TAB: 250 | 90 days supply | Qty: 90 | Fill #1

## 2018-04-19 MED FILL — FENOFIBRATE 160 MG TABLET: 160 | 90 days supply | Qty: 90 | Fill #1

## 2018-04-27 MED FILL — metFORMIN HCL 500 MG TABS: 500 | 90 days supply | Qty: 180 | Fill #1

## 2018-05-15 MED FILL — VENLAFAXINE HCL ER 37.5 MG: 37.5 | 90 days supply | Qty: 90 | Fill #2

## 2018-05-24 DIAGNOSIS — D225 Melanocytic nevi of trunk: Secondary | ICD-10-CM | POA: Diagnosis not present

## 2018-05-24 DIAGNOSIS — L821 Other seborrheic keratosis: Secondary | ICD-10-CM | POA: Diagnosis not present

## 2018-05-29 ENCOUNTER — Other Ambulatory Visit: Payer: Self-pay | Admitting: Adult Health

## 2018-05-29 DIAGNOSIS — Z1231 Encounter for screening mammogram for malignant neoplasm of breast: Secondary | ICD-10-CM

## 2018-06-29 ENCOUNTER — Other Ambulatory Visit: Payer: Self-pay | Admitting: Internal Medicine

## 2018-06-29 MED FILL — ACCU-CHEK GUIDE STRP: 50 days supply | Qty: 100 | Fill #1

## 2018-06-29 MED FILL — JANUVIA 100 MG TABLET: 100 | 90 days supply | Qty: 90 | Fill #0

## 2018-06-30 MED FILL — ACCU-CHEK FASTCLIX LANCETS: 51 days supply | Qty: 102 | Fill #1

## 2018-07-06 ENCOUNTER — Other Ambulatory Visit: Payer: Self-pay | Admitting: Internal Medicine

## 2018-07-06 MED FILL — RAMIPRIL 2.5 MG CAPSULE: 2.5 | 90 days supply | Qty: 90 | Fill #0

## 2018-07-12 ENCOUNTER — Ambulatory Visit
Admission: RE | Admit: 2018-07-12 | Discharge: 2018-07-12 | Disposition: A | Discharge status: Home / Self Care | Payer: 59 | Source: Ambulatory Visit | Attending: Adult Health | Admitting: Adult Health

## 2018-07-12 DIAGNOSIS — Z1231 Encounter for screening mammogram for malignant neoplasm of breast: Secondary | ICD-10-CM | POA: Diagnosis not present

## 2018-07-12 HISTORY — DX: Personal history of irradiation: Z92.3

## 2018-07-18 ENCOUNTER — Other Ambulatory Visit: Payer: 59 | Admitting: Internal Medicine

## 2018-07-21 ENCOUNTER — Other Ambulatory Visit: Payer: 59 | Admitting: Internal Medicine

## 2018-07-21 DIAGNOSIS — E8881 Metabolic syndrome: Secondary | ICD-10-CM | POA: Diagnosis not present

## 2018-07-21 DIAGNOSIS — Z0000 Encounter for general adult medical examination without abnormal findings: Secondary | ICD-10-CM | POA: Diagnosis not present

## 2018-07-21 DIAGNOSIS — E119 Type 2 diabetes mellitus without complications: Secondary | ICD-10-CM | POA: Diagnosis not present

## 2018-07-21 DIAGNOSIS — M797 Fibromyalgia: Secondary | ICD-10-CM | POA: Diagnosis not present

## 2018-07-21 DIAGNOSIS — F5101 Primary insomnia: Secondary | ICD-10-CM | POA: Diagnosis not present

## 2018-07-22 LAB — COMPLETE METABOLIC PANEL WITH GFR
AG Ratio: 1.4 (calc) (ref 1.0–2.5)
ALBUMIN MSPROF: 4.1 g/dL (ref 3.6–5.1)
ALT: 25 U/L (ref 6–29)
AST: 30 U/L (ref 10–35)
Alkaline phosphatase (APISO): 29 U/L — ABNORMAL LOW (ref 33–130)
BUN/Creatinine Ratio: 28 (calc) — ABNORMAL HIGH (ref 6–22)
BUN: 26 mg/dL — AB (ref 7–25)
CALCIUM: 9.6 mg/dL (ref 8.6–10.4)
CHLORIDE: 105 mmol/L (ref 98–110)
CO2: 29 mmol/L (ref 20–32)
Creat: 0.93 mg/dL (ref 0.50–0.99)
GFR, Est African American: 75 mL/min/{1.73_m2} (ref >=60)
GFR, Est Non African American: 65 mL/min/{1.73_m2} (ref >=60)
GLOBULIN: 2.9 g/dL (ref 1.9–3.7)
GLUCOSE: 94 mg/dL (ref 65–99)
POTASSIUM: 4.6 mmol/L (ref 3.5–5.3)
Sodium: 142 mmol/L (ref 135–146)
Total Bilirubin: 0.4 mg/dL (ref 0.2–1.2)
Total Protein: 7 g/dL (ref 6.1–8.1)

## 2018-07-22 LAB — LIPID PANEL
CHOLESTEROL: 209 mg/dL — AB (ref <200)
HDL: 52 mg/dL (ref >50)
LDL Cholesterol (Calc): 128 mg/dL (calc) — ABNORMAL HIGH
Non-HDL Cholesterol (Calc): 157 mg/dL (calc) — ABNORMAL HIGH (ref <130)
Total CHOL/HDL Ratio: 4 (calc) (ref <5.0)
Triglycerides: 172 mg/dL — ABNORMAL HIGH (ref <150)

## 2018-07-22 LAB — CBC WITH DIFFERENTIAL/PLATELET
Absolute Monocytes: 366 cells/uL (ref 200–950)
BASOS PCT: 1.1 %
Basophils Absolute: 68 cells/uL (ref 0–200)
EOS PCT: 3.5 %
Eosinophils Absolute: 217 cells/uL (ref 15–500)
HEMATOCRIT: 38 % (ref 35.0–45.0)
HEMOGLOBIN: 12.6 g/dL (ref 11.7–15.5)
LYMPHS ABS: 1172 {cells}/uL (ref 850–3900)
MCH: 28.1 pg (ref 27.0–33.0)
MCHC: 33.2 g/dL (ref 32.0–36.0)
MCV: 84.8 fL (ref 80.0–100.0)
MPV: 9.9 fL (ref 7.5–12.5)
Monocytes Relative: 5.9 %
NEUTROS ABS: 4377 {cells}/uL (ref 1500–7800)
Neutrophils Relative %: 70.6 %
Platelets: 414 10*3/uL — ABNORMAL HIGH (ref 140–400)
RBC: 4.48 10*6/uL (ref 3.80–5.10)
RDW: 13.2 % (ref 11.0–15.0)
Total Lymphocyte: 18.9 %
WBC: 6.2 10*3/uL (ref 3.8–10.8)

## 2018-07-22 LAB — TSH: TSH: 1.6 mIU/L (ref 0.40–4.50)

## 2018-07-22 LAB — HEMOGLOBIN A1C
Hgb A1c MFr Bld: 6 % of total Hgb — ABNORMAL HIGH (ref <5.7)
MEAN PLASMA GLUCOSE: 126 (calc)
eAG (mmol/L): 7 (calc)

## 2018-07-22 LAB — VITAMIN D 25 HYDROXY (VIT D DEFICIENCY, FRACTURES): Vit D, 25-Hydroxy: 31 ng/mL (ref 30–100)

## 2018-07-28 ENCOUNTER — Encounter: Payer: Self-pay | Admitting: Internal Medicine

## 2018-07-28 ENCOUNTER — Ambulatory Visit (INDEPENDENT_AMBULATORY_CARE_PROVIDER_SITE_OTHER): Payer: 59 | Admitting: Internal Medicine

## 2018-07-28 VITALS — BP 160/100 | HR 84 | Temp 98.6°F | Ht 60.0 in | Wt 151.0 lb

## 2018-07-28 DIAGNOSIS — F3289 Other specified depressive episodes: Secondary | ICD-10-CM

## 2018-07-28 DIAGNOSIS — M797 Fibromyalgia: Secondary | ICD-10-CM

## 2018-07-28 DIAGNOSIS — E782 Mixed hyperlipidemia: Secondary | ICD-10-CM

## 2018-07-28 DIAGNOSIS — Z853 Personal history of malignant neoplasm of breast: Secondary | ICD-10-CM | POA: Diagnosis not present

## 2018-07-28 DIAGNOSIS — E7800 Pure hypercholesterolemia, unspecified: Secondary | ICD-10-CM

## 2018-07-28 DIAGNOSIS — E8881 Metabolic syndrome: Secondary | ICD-10-CM

## 2018-07-28 DIAGNOSIS — Z86018 Personal history of other benign neoplasm: Secondary | ICD-10-CM

## 2018-07-28 DIAGNOSIS — F5101 Primary insomnia: Secondary | ICD-10-CM

## 2018-07-28 DIAGNOSIS — E119 Type 2 diabetes mellitus without complications: Secondary | ICD-10-CM | POA: Diagnosis not present

## 2018-07-28 DIAGNOSIS — Z0000 Encounter for general adult medical examination without abnormal findings: Secondary | ICD-10-CM

## 2018-07-28 DIAGNOSIS — K219 Gastro-esophageal reflux disease without esophagitis: Secondary | ICD-10-CM

## 2018-07-28 LAB — POCT URINALYSIS DIPSTICK
Appearance: NEGATIVE
BILIRUBIN UA: NEGATIVE
Blood, UA: NEGATIVE
GLUCOSE UA: NEGATIVE
KETONES UA: NEGATIVE
Leukocytes, UA: NEGATIVE
Nitrite, UA: NEGATIVE
ODOR: NEGATIVE
Protein, UA: NEGATIVE
Spec Grav, UA: 1.01 (ref 1.010–1.025)
Urobilinogen, UA: 0.2 E.U./dL
pH, UA: 6.5 (ref 5.0–8.0)

## 2018-07-28 MED ORDER — VENLAFAXINE HCL ER 75 MG PO CP24: 75.0000 mg | ORAL_CAPSULE | Freq: Every day | ORAL | 3 refills | Status: DC

## 2018-07-28 MED ORDER — RAMIPRIL 2.5 MG PO CAPS: 2.5000 mg | ORAL_CAPSULE | Freq: Every day | ORAL | 1 refills | Status: DC

## 2018-07-28 MED ORDER — LORAZEPAM 2 MG PO TABS: 2.0000 mg | ORAL_TABLET | Freq: Every day | ORAL | 1 refills | Status: DC

## 2018-07-28 MED ORDER — ROSUVASTATIN CALCIUM 10 MG PO TABS: 10.0000 mg | ORAL_TABLET | Freq: Every day | ORAL | 1 refills | Status: DC

## 2018-07-28 MED ORDER — METFORMIN HCL 500 MG PO TABS: 500.0000 mg | ORAL_TABLET | Freq: Two times a day (BID) | ORAL | 1 refills | Status: DC

## 2018-07-28 MED ORDER — FENOFIBRATE 160 MG PO TABS: 160.0000 mg | ORAL_TABLET | Freq: Every day | ORAL | 1 refills | Status: DC

## 2018-07-28 MED ORDER — ONDANSETRON HCL 4 MG PO TABS: 4.0000 mg | ORAL_TABLET | Freq: Three times a day (TID) | ORAL | 0 refills | Status: DC | PRN

## 2018-07-28 MED FILL — LORazepam 2 MG TABS: 2 | 90 days supply | Qty: 90 | Fill #0

## 2018-07-28 MED FILL — ROSUVASTATIN CALCIUM 10 MG: 10 | 90 days supply | Qty: 90 | Fill #0

## 2018-07-28 MED FILL — metFORMIN HCL 500 MG TABS: 500 | 90 days supply | Qty: 180 | Fill #0

## 2018-07-28 MED FILL — ONDANSETRON HCL 4 MG TABLET: 4 | 7 days supply | Qty: 20 | Fill #0

## 2018-07-28 MED FILL — FENOFIBRATE 160 MG TABLET: 160 | 90 days supply | Qty: 90 | Fill #0

## 2018-07-28 MED FILL — PANTOPRAZOLE SOD DR 40 MG T: 40 | 90 days supply | Qty: 90 | Fill #3

## 2018-07-28 NOTE — Progress Notes (Signed)
Subjective:    Patient ID: Jill Richardson, female    DOB: [birth year], 63 y.o.   MRN: [id number]  HPI 63 year old White Female in today for health maintenance exam and evaluation of medical issues.  Continues to work as an Engineer, production at [employer].  History of low back pain and lumbar fusion L5-S1 in 2005 with cages and pedicle screws by Dr. Carloyn Manner.  History of breast cancer, controlled type 2 diabetes mellitus, hyperlipidemia, GE reflux, fibromyalgia, obesity and history of SVT which she says still occurs sometimes.  This is treated with digoxin and will be continued.  She is allergic to Lodine.  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  Able to tolerate Daypro for musculoskeletal pain.  Left oophorectomy March 1997.  Exploratory laparotomy for small bowel obstruction 1 month later.  History of dysplastic nevus of the shoulder 2008.  Longstanding history of fibromyalgia syndrome dating back to the 1990s.  Colonoscopy done 2008 by Dr. Cristina Gong with inflammatory polyp removed.  This was not adenomatous.  And another colonoscopy in 2018 with another benign polyp removed with follow-up recommended in 10 years  In September 2019 she had a periumbilical hernia repair by Dr. Georgette Dover.  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  She has had 2 pregnancies and no miscarriages.  Menarche at age 89.  Menopause around age 63.  She took estrogen replacement until her cancer diagnosis.  Tumor was ER/PR positive with evidence of lymphovascular invasion.  Nodes were negative.  She had radiation and lumpectomy.  She was treated with tamoxifen and then Femara .  She takes Crestor for hyperlipidemia.  Takes Flexeril for back pain and Ativan for insomnia.  Occasionally takes tramadol for back pain or Norco.  History of GE reflux treated with PPI.  Is on metformin and Januvia for diabetes mellitus with good control.  Take flu vaccine through  employment.  Family history: Father with history of diabetes and hypertension died from complications of heart failure.  Mother is in good health.  Brother with history of hypertension.  Sister in good health.  Social history: She is married and has grandchildren.  Husband is disabled due to chronic brain injury after a tree fell on him in the mid 54s.  2 adult sons.    Review of Systems no new complaints     Objective:   Physical Exam Vitals signs reviewed.  Constitutional:      General: She is not in acute distress.    Appearance: Normal appearance. She is obese. She is not diaphoretic.  HENT:     Head: Normocephalic and atraumatic.     Right Ear: Tympanic membrane normal.     Left Ear: Tympanic membrane normal.     Nose: Nose normal.     Mouth/Throat:     Mouth: Mucous membranes are moist.  Eyes:     General: No scleral icterus.       Right eye: No discharge.        Left eye: No discharge.     Extraocular Movements: Extraocular movements intact.     Conjunctiva/sclera: Conjunctivae normal.     Pupils: Pupils are equal, round, and reactive to light.  Neck:     Musculoskeletal: Neck supple.     Vascular: No carotid bruit.     Comments: No thyromegaly Cardiovascular:     Rate and Rhythm: Normal rate and regular rhythm.     Heart sounds: No murmur.  Pulmonary:     Effort: Pulmonary effort is normal. No respiratory distress.     Breath sounds: Normal breath sounds.     Comments: Has some thickening in right medial breast and fibrocystic changes in left breast.  Recent normal mammogram Abdominal:     General: Bowel sounds are normal.     Palpations: Abdomen is soft. There is no mass.     Tenderness: There is no abdominal tenderness. There is no rebound.  Genitourinary:    Comments: Bimanual exam normal.  Pap deferred because she will be 63 in a couple of months.  Says GYN did not think she needed another Pap smear at this point Musculoskeletal:     Right lower leg: No  edema.     Left lower leg: No edema.  Lymphadenopathy:     Cervical: No cervical adenopathy.  Skin:    General: Skin is warm and dry.     Findings: No rash.  Neurological:     General: No focal deficit present.     Mental Status: She is alert and oriented to person, place, and time.     Cranial Nerves: No cranial nerve deficit.     Coordination: Coordination normal.  Psychiatric:        Mood and Affect: Mood normal.        Behavior: Behavior normal.        Thought Content: Thought content normal.        Judgment: Judgment normal.           Assessment & Plan:  History of fibromyalgia syndrome stable on current treatment  History of breast cancer-no recurrence  History of back pain with L5-S1 fusion 2005  Controlled type 2 diabetes mellitus  History of supraventricular tachycardia treated with digoxin and patient would like to stay on it because she says sometimes she has episodes of SVT  Insomnia treated with chronic sleep medication  History of depression-stable  Hyperlipidemia-treated with statin  Obesity-continue diet and exercise efforts  History of dysplastic nevus 2008  GE reflux treated with PPI  Metabolic syndrome  Plan: Continue same medications and return in 6 months.  Immunizations are up-to-date.  She is considering retirement sometime next year.

## 2018-08-07 MED FILL — VENLAFAXINE HCL ER 75 MG CA: 75 | 90 days supply | Qty: 90 | Fill #0

## 2018-09-12 ENCOUNTER — Other Ambulatory Visit: Payer: Self-pay

## 2018-09-12 ENCOUNTER — Telehealth: Payer: Self-pay | Admitting: Internal Medicine

## 2018-09-12 DIAGNOSIS — I10 Essential (primary) hypertension: Secondary | ICD-10-CM

## 2018-09-12 MED ORDER — AMLODIPINE BESYLATE 5 MG PO TABS: 5.0000 mg | ORAL_TABLET | Freq: Every day | ORAL | 0 refills | Status: DC

## 2018-09-12 MED FILL — AMLODIPINE BESYLATE 5 MG TA: 5 | 30 days supply | Qty: 30 | Fill #0

## 2018-09-12 NOTE — Telephone Encounter (Signed)
Call in Norvasc 5 mg daily. Call us with BP readings in 7-10 days

## 2018-09-12 NOTE — Telephone Encounter (Signed)
Jill Richardson [phone]  [name] called to say her BP is running high, she wanted to see if you could call anything in without her coming in to office, with the current coronavirus. I ask her to fax blood pressure readings.

## 2018-09-26 MED FILL — RAMIPRIL 2.5 MG CAPS: 2.5 | 90 days supply | Qty: 90 | Fill #0

## 2018-09-26 MED FILL — JANUVIA 100 MG TABLET: 100 | 90 days supply | Qty: 90 | Fill #0

## 2018-09-26 MED FILL — ROSUVASTATIN CALCIUM 10 MG: 10 | 90 days supply | Qty: 90 | Fill #0

## 2018-09-26 MED FILL — PANTOPRAZOLE SOD DR 40 MG T: 40 | 90 days supply | Qty: 90 | Fill #0

## 2018-10-02 ENCOUNTER — Encounter: Payer: Self-pay | Admitting: Internal Medicine

## 2018-10-02 ENCOUNTER — Ambulatory Visit (INDEPENDENT_AMBULATORY_CARE_PROVIDER_SITE_OTHER): Payer: 59 | Admitting: Internal Medicine

## 2018-10-02 DIAGNOSIS — I10 Essential (primary) hypertension: Secondary | ICD-10-CM | POA: Diagnosis not present

## 2018-10-02 MED ORDER — LOSARTAN POTASSIUM 50 MG PO TABS: 50.0000 mg | ORAL_TABLET | Freq: Every day | ORAL | 0 refills | Status: DC

## 2018-10-02 MED ORDER — AMLODIPINE BESYLATE 5 MG PO TABS: 5.0000 mg | ORAL_TABLET | Freq: Every day | ORAL | 0 refills | Status: DC

## 2018-10-02 MED FILL — LOSARTAN POTASSIUM 100 MG T: 100 | 30 days supply | Qty: 15 | Fill #0

## 2018-10-02 MED FILL — SM BLOOD PRESSURE MONITOR: 30 days supply | Qty: 1 | Fill #0

## 2018-10-02 NOTE — Patient Instructions (Addendum)
Discontinue Altace (ramipril) take amlodipine 5 mg daily and start Cozaar 50 mg daily.  Call with blood pressure readings in 2 to 3 weeks.  We can add diuretic if necessary or increase Cozaar to 100 mg daily.  She has follow-up appointment here in July.

## 2018-10-02 NOTE — Progress Notes (Signed)
   Subjective:      HPI 63 year old Female for follow up on elevated blood pressure.In mid march was started on Amlodipine 5 mg daily in response to persistently elevated BP readings.  She has submitted multiple blood pressure readings via fax and BP is still elevated.  In late February through mid March blood pressure was ranging 152/88, 160/92, 170/108, 156/104.  She had not been on antihypertensive medication previously except for low-dose ramipril due to diabetes mellitus.  Interactive audio and video telecommunications were attempted between this provider and patient however failed.  Therefore we continued with audio only.    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  Reviewed blood pressure readings she had submitted.      Assessment & Plan:  Persistently elevated blood pressure readings despite addition of amlodipine.  Discontinue ramipril 2.5 mg daily which was given for renal protection in the setting of diabetes.  Start Cozaar 50 mg daily.  Monitor blood pressure and call if not responding in 2 weeks.  She has a follow-up appointment here in July but we can certainly see her sooner if necessary.  Continue amlodipine.  May need to add diuretic and/or increased dose of Cozaar.

## 2018-10-06 MED FILL — AMLODIPINE BESYLATE 5 MG TA: 5 | 30 days supply | Qty: 30 | Fill #0

## 2018-10-11 MED FILL — FENOFIBRATE 160 MG TABLET: 160 | 90 days supply | Qty: 90 | Fill #0

## 2018-10-22 ENCOUNTER — Encounter: Payer: Self-pay | Admitting: Internal Medicine

## 2018-10-26 ENCOUNTER — Other Ambulatory Visit: Payer: Self-pay

## 2018-10-26 ENCOUNTER — Ambulatory Visit (INDEPENDENT_AMBULATORY_CARE_PROVIDER_SITE_OTHER): Payer: 59 | Admitting: Internal Medicine

## 2018-10-26 ENCOUNTER — Encounter: Payer: Self-pay | Admitting: Internal Medicine

## 2018-10-26 DIAGNOSIS — M791 Myalgia, unspecified site: Secondary | ICD-10-CM | POA: Diagnosis not present

## 2018-10-26 DIAGNOSIS — I10 Essential (primary) hypertension: Secondary | ICD-10-CM

## 2018-10-26 DIAGNOSIS — M797 Fibromyalgia: Secondary | ICD-10-CM | POA: Diagnosis not present

## 2018-10-26 MED ORDER — LOSARTAN POTASSIUM 100 MG PO TABS: 100.0000 mg | ORAL_TABLET | Freq: Every day | ORAL | 0 refills | Status: DC

## 2018-10-26 MED ORDER — OXAPROZIN 600 MG PO TABS: 1200.0000 mg | ORAL_TABLET | Freq: Every day | ORAL | 3 refills | Status: DC

## 2018-10-26 MED ORDER — AMLODIPINE BESYLATE 5 MG PO TABS: 5.0000 mg | ORAL_TABLET | Freq: Every day | ORAL | 3 refills | Status: DC

## 2018-10-26 MED FILL — LOSARTAN POTASSIUM 100 MG T: 100 | 30 days supply | Qty: 30 | Fill #0

## 2018-10-26 MED FILL — AMLODIPINE BESYLATE 5 MG TA: 5 | 90 days supply | Qty: 90 | Fill #0

## 2018-10-26 MED FILL — OXAPROZIN 600 MG TABLET: 600 | 90 days supply | Qty: 180 | Fill #0

## 2018-10-26 NOTE — Progress Notes (Signed)
   Subjective:    Patient ID: Jill Richardson, female    DOB: 09-09-1955, 63 y.o.   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  History of fibromyalgia syndrome.  She recently quit taking Daypro.  Has noted significant myalgias and arthralgias.  She is on Crestor 10 mg daily but has not had an issue with musculoskeletal pain despite having been on that for some time.  Due to the Coronavirus pandemic, interactive audio and video telecommunications were achieved between this provider and patient.  Patient identified his symptoms are more of a longstanding patient in this practice using 2 identifiers.  She agrees to visit in this format today.  Musculoskeletal pain has been attributed previously to fibromyalgia syndrome.  Patient also has hypertension and was recently started on losartan 50 mg daily.  Blood pressure continues to fluctuate.  She is also on amlodipine which will be refilled today.  At times systolic is up to 528 and can be as low as 119.  Diastolic ranges from 41-32.  These readings are since April 14.  We are going to increase losartan to 100 mg daily and she will continue to monitor her blood pressure.  We can add a diuretic if necessary.  She will continue with amlodipine.    Review of Systems     Objective:   Physical Exam Not examined today she submitted multiple blood pressure readings which were reviewed today       Assessment & Plan:  Essential hypertension-increase losartan from 50 to 100 mg daily continue to monitor blood pressure.  Myalgias-could be exacerbated by stopping Daypro.  Has longstanding history of fibromyalgia syndrome with myalgias.  She will restart Daypro.  If myalgias persist we can consider stopping Crestor.  Norvasc is been refilled she will continue on that for essential hypertension.  She will contact me again in another 2 weeks or so and let me know how her blood pressure control is going.

## 2018-10-26 NOTE — Patient Instructions (Signed)
Increase losartan from 50 to 100 mg daily continue to monitor blood pressures.  Start Daypro for myalgias.  Myalgias persist, consider stopping Crestor.  Recontact me in 2 weeks with blood pressure readings

## 2018-10-30 MED FILL — metFORMIN HCL 500 MG TABS: 500 | 90 days supply | Qty: 180 | Fill #0

## 2018-11-13 MED FILL — VENLAFAXINE HCL ER 75 MG CA: 75 | 90 days supply | Qty: 90 | Fill #0

## 2018-11-24 ENCOUNTER — Telehealth: Payer: Self-pay | Admitting: Internal Medicine

## 2018-11-24 MED ORDER — LOSARTAN POTASSIUM 100 MG PO TABS: 100.0000 mg | ORAL_TABLET | Freq: Every day | ORAL | 0 refills | Status: DC

## 2018-11-24 MED FILL — LOSARTAN POTASSIUM 100 MG T: 100 | 30 days supply | Qty: 30 | Fill #0

## 2018-11-24 NOTE — Telephone Encounter (Signed)
Did you get her BP readings?

## 2018-11-24 NOTE — Addendum Note (Signed)
Addended by: Mady Haagensen on: 11/24/2018 12:39 PM   Modules accepted: Orders

## 2018-11-24 NOTE — Telephone Encounter (Signed)
Patient was notified.

## 2018-11-24 NOTE — Telephone Encounter (Signed)
Yes and they look fine to me.

## 2018-11-24 NOTE — Telephone Encounter (Signed)
Pt said she faxed over a list of blood pressures she recorded and didn't know if Dr Renold Genta had gotten a chance to look at them yet, I told her I would send a message to see.

## 2018-11-26 ENCOUNTER — Telehealth: Payer: Self-pay | Admitting: Internal Medicine

## 2018-11-26 NOTE — Telephone Encounter (Signed)
Pt faxed multiple BP readings on increased dose of Losartan- went from 50 to 100 mg daily. These were reviewed last week. These are very acceptable  120- mid 618'M systolic and 85-92 mostly low 76'F diatolic. No change in meds for now.

## 2018-12-26 ENCOUNTER — Other Ambulatory Visit: Payer: Self-pay | Admitting: Internal Medicine

## 2018-12-26 MED FILL — JANUVIA 100 MG TABLET: 100 | 90 days supply | Qty: 90 | Fill #0

## 2018-12-26 MED FILL — LORazepam 2 MG TABS: 2 | 90 days supply | Qty: 90 | Fill #1

## 2018-12-26 MED FILL — PANTOPRAZOLE SOD DR 40 MG T: 40 | 90 days supply | Qty: 90 | Fill #0

## 2018-12-26 MED FILL — LOSARTAN POTASSIUM 100 MG T: 100 | 30 days supply | Qty: 30 | Fill #1

## 2019-01-18 ENCOUNTER — Telehealth: Payer: Self-pay | Admitting: Internal Medicine

## 2019-01-18 ENCOUNTER — Other Ambulatory Visit: Payer: Self-pay | Admitting: Internal Medicine

## 2019-01-18 MED FILL — FENOFIBRATE 160 MG TABLET: 160 | 90 days supply | Qty: 90 | Fill #0

## 2019-01-18 MED FILL — ROSUVASTATIN CALCIUM 10 MG: 10 | 90 days supply | Qty: 90 | Fill #0

## 2019-01-18 NOTE — Telephone Encounter (Signed)
She needs C-met, TSH and Hgb AIC

## 2019-01-18 NOTE — Telephone Encounter (Signed)
Pt called and said she has an appt coming up next Friday and didn't know if Dr Renold Genta wanted her to get labs done for this appt or not

## 2019-01-18 NOTE — Telephone Encounter (Signed)
Added

## 2019-01-22 ENCOUNTER — Other Ambulatory Visit: Payer: Self-pay | Admitting: Internal Medicine

## 2019-01-22 MED FILL — metFORMIN HCL 500 MG TABS: 500 | 90 days supply | Qty: 180 | Fill #0

## 2019-01-23 ENCOUNTER — Other Ambulatory Visit: Payer: 59 | Admitting: Internal Medicine

## 2019-01-23 ENCOUNTER — Other Ambulatory Visit: Payer: Self-pay

## 2019-01-23 DIAGNOSIS — I10 Essential (primary) hypertension: Secondary | ICD-10-CM

## 2019-01-23 DIAGNOSIS — M797 Fibromyalgia: Secondary | ICD-10-CM | POA: Diagnosis not present

## 2019-01-24 LAB — COMPLETE METABOLIC PANEL WITH GFR
AG Ratio: 1.3 (calc) (ref 1.0–2.5)
ALT: 22 U/L (ref 6–29)
AST: 27 U/L (ref 10–35)
Albumin: 3.8 g/dL (ref 3.6–5.1)
Alkaline phosphatase (APISO): 28 U/L — ABNORMAL LOW (ref 37–153)
BUN/Creatinine Ratio: 31 (calc) — ABNORMAL HIGH (ref 6–22)
BUN: 28 mg/dL — ABNORMAL HIGH (ref 7–25)
CO2: 22 mmol/L (ref 20–32)
Calcium: 9.4 mg/dL (ref 8.6–10.4)
Chloride: 108 mmol/L (ref 98–110)
Creat: 0.9 mg/dL (ref 0.50–0.99)
GFR, Est African American: 78 mL/min/{1.73_m2} (ref >=60)
GFR, Est Non African American: 67 mL/min/{1.73_m2} (ref >=60)
Globulin: 2.9 g/dL (calc) (ref 1.9–3.7)
Glucose, Bld: 78 mg/dL (ref 65–139)
Potassium: 4.4 mmol/L (ref 3.5–5.3)
Sodium: 140 mmol/L (ref 135–146)
Total Bilirubin: 0.3 mg/dL (ref 0.2–1.2)
Total Protein: 6.7 g/dL (ref 6.1–8.1)

## 2019-01-24 LAB — HEMOGLOBIN A1C
Hgb A1c MFr Bld: 5.6 %{Hb}
Mean Plasma Glucose: 114 (calc)
eAG (mmol/L): 6.3 (calc)

## 2019-01-24 LAB — TSH: TSH: 0.7 m[IU]/L (ref 0.40–4.50)

## 2019-01-26 ENCOUNTER — Encounter: Payer: Self-pay | Admitting: Internal Medicine

## 2019-01-26 ENCOUNTER — Ambulatory Visit (INDEPENDENT_AMBULATORY_CARE_PROVIDER_SITE_OTHER): Payer: 59 | Admitting: Internal Medicine

## 2019-01-26 ENCOUNTER — Other Ambulatory Visit: Payer: Self-pay

## 2019-01-26 DIAGNOSIS — I10 Essential (primary) hypertension: Secondary | ICD-10-CM | POA: Diagnosis not present

## 2019-01-26 MED ORDER — ONDANSETRON HCL 4 MG PO TABS: 4.0000 mg | ORAL_TABLET | Freq: Three times a day (TID) | ORAL | 0 refills | Status: DC | PRN

## 2019-01-26 MED ORDER — LORATADINE 10 MG PO TABS: 10.0000 mg | ORAL_TABLET | Freq: Every day | ORAL | 3 refills | Status: DC

## 2019-01-26 MED ORDER — VENLAFAXINE HCL ER 75 MG PO CP24: 75.0000 mg | ORAL_CAPSULE | Freq: Every day | ORAL | 3 refills | Status: DC

## 2019-01-26 MED ORDER — ACCU-CHEK GUIDE VI STRP: ORAL_STRIP | 12 refills | Status: DC

## 2019-01-26 MED ORDER — AMLODIPINE BESYLATE 5 MG PO TABS: 5.0000 mg | ORAL_TABLET | Freq: Every day | ORAL | 3 refills | Status: DC

## 2019-01-26 MED FILL — SM LORATADINE 10 MG TABS: 10 | 90 days supply | Qty: 90 | Fill #0

## 2019-01-26 MED FILL — AMLODIPINE BESYLATE 5 MG TA: 5 | 90 days supply | Qty: 90 | Fill #0

## 2019-01-26 MED FILL — VENLAFAXINE HCL ER 75 MG CA: 75 | 90 days supply | Qty: 90 | Fill #0

## 2019-01-26 MED FILL — ONDANSETRON HCL 4 MG TABLET: 4 | 7 days supply | Qty: 20 | Fill #0

## 2019-01-26 MED FILL — ACCU-CHEK GUIDE STRP: 50 days supply | Qty: 100 | Fill #0

## 2019-01-26 NOTE — Progress Notes (Signed)
   Subjective:    Patient ID: Jill Richardson, female    DOB: 1955-12-26, 63 y.o.   MRN: [id number]  HPI 63 year old Female in for 62-monthrecheck.  History of SVT, metabolic syndrome, hyperlipidemia, essential hypertension, controlled type 2 diabetes mellitus, GE reflux.  History of insomnia.  Hemoglobin A1c excellent at 5.6%.  TSH normal.  C met normal.  Due for Welcome to Medicare physical exam January 2021.  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  Remains on lipid-lowering medication including generic Crestor and fenofibrate.  Remains on Effexor for depression.  Takes Daypro for musculoskeletal pain/fibromyalgia.  For glucose intolerance is on metformin and Januvia.      Review of Systems no new complaints     Objective:   Physical Exam Blood pressure stable at 120/88.  BMI 30.86.  Pulse 78.  Weight 158 pounds.  Skin warm and dry..  Cardiac exam regular rate and rhythm normal S1 and.  Extremities without edema.  No carotid bruits.       Assessment & Plan:  Essential hypertension-stable on current regimen  Diabetes mellitus-stable on current regimen.  She wears a [employer] device keep track of her Accu-Cheks and activity  Depression-stable on Effexor  Hyperlipidemia-continue statin medication and fenofibrate  Fibromyalgia-treated with anxiety  Insomnia treated with Ativan  Musculoskeletal pain treated with Daypro  GE reflux treated with PPI  Remote history of breast cancer with no recurrence  Remote history of SVT treated with digoxin  Plan: Continue current medications.  I am pleased with her lab results today.  Follow-up in 6 months for maintenance exam and fasting labs.

## 2019-01-26 NOTE — Patient Instructions (Signed)
I am pleased with your lab results.  Continue current medications and follow-up with physical exam and fasting lab work in 6 months.

## 2019-02-06 MED FILL — SHINGRIX 50 MCG SUS: 50 | 1 days supply | Qty: 1 | Fill #0

## 2019-02-26 MED FILL — LOSARTAN POTASSIUM 100 MG T: 100 | 30 days supply | Qty: 30 | Fill #0

## 2019-03-26 MED FILL — OXAPROZIN 600 MG TABLET: 600 | 90 days supply | Qty: 180 | Fill #0

## 2019-03-26 MED FILL — LOSARTAN POTASSIUM 100 MG T: 100 | 30 days supply | Qty: 30 | Fill #1

## 2019-04-03 MED FILL — JANUVIA 100 MG TABLET: 100 | 90 days supply | Qty: 90 | Fill #1

## 2019-04-09 DIAGNOSIS — Z20828 Contact with and (suspected) exposure to other viral communicable diseases: Secondary | ICD-10-CM | POA: Diagnosis not present

## 2019-04-09 DIAGNOSIS — R05 Cough: Secondary | ICD-10-CM | POA: Diagnosis not present

## 2019-04-11 MED FILL — SHINGRIX 50 MCG SUS: 50 | 1 days supply | Qty: 1 | Fill #1

## 2019-04-16 ENCOUNTER — Other Ambulatory Visit: Payer: Self-pay | Admitting: Internal Medicine

## 2019-04-16 MED FILL — metFORMIN HCL 500 MG TABS: 500 | 90 days supply | Qty: 180 | Fill #0

## 2019-04-23 ENCOUNTER — Other Ambulatory Visit: Payer: Self-pay | Admitting: Internal Medicine

## 2019-04-23 MED FILL — LOSARTAN POTASSIUM 100 MG T: 100 | 90 days supply | Qty: 90 | Fill #0

## 2019-04-23 MED FILL — ROSUVASTATIN CALCIUM 10 MG: 10 | 90 days supply | Qty: 90 | Fill #0

## 2019-04-23 MED FILL — PANTOPRAZOLE SOD DR 40 MG T: 40 | 90 days supply | Qty: 90 | Fill #1

## 2019-04-23 MED FILL — FENOFIBRATE 160 MG TABLET: 160 | 90 days supply | Qty: 90 | Fill #0

## 2019-05-08 MED FILL — AMLODIPINE BESYLATE 5 MG TA: 5 | 90 days supply | Qty: 90 | Fill #1

## 2019-05-08 MED FILL — VENLAFAXINE HCL ER 75 MG CA: 75 | 90 days supply | Qty: 90 | Fill #1

## 2019-05-11 DIAGNOSIS — H524 Presbyopia: Secondary | ICD-10-CM | POA: Diagnosis not present

## 2019-06-11 ENCOUNTER — Other Ambulatory Visit: Payer: Self-pay | Admitting: Adult Health

## 2019-06-11 DIAGNOSIS — Z1231 Encounter for screening mammogram for malignant neoplasm of breast: Secondary | ICD-10-CM

## 2019-07-09 ENCOUNTER — Other Ambulatory Visit: Payer: Self-pay | Admitting: Internal Medicine

## 2019-07-09 MED FILL — JANUVIA 100 MG TABLET: 100 | 60 days supply | Qty: 60 | Fill #0

## 2019-07-23 ENCOUNTER — Other Ambulatory Visit: Payer: Self-pay | Admitting: Internal Medicine

## 2019-07-23 MED FILL — FENOFIBRATE 160 MG TABLET: 160 | 90 days supply | Qty: 90 | Fill #0

## 2019-07-23 MED FILL — metFORMIN HCL 500 MG TABS: 500 | 90 days supply | Qty: 180 | Fill #0

## 2019-07-23 MED FILL — LOSARTAN POTASSIUM 100 MG T: 100 | 90 days supply | Qty: 90 | Fill #1

## 2019-08-01 ENCOUNTER — Ambulatory Visit
Admission: RE | Admit: 2019-08-01 | Discharge: 2019-08-01 | Disposition: A | Discharge status: Home / Self Care | Payer: 59 | Source: Ambulatory Visit | Attending: Adult Health | Admitting: Adult Health

## 2019-08-01 ENCOUNTER — Other Ambulatory Visit: Payer: Self-pay | Admitting: Internal Medicine

## 2019-08-01 ENCOUNTER — Other Ambulatory Visit: Payer: Self-pay

## 2019-08-01 DIAGNOSIS — Z1231 Encounter for screening mammogram for malignant neoplasm of breast: Secondary | ICD-10-CM | POA: Diagnosis not present

## 2019-08-01 MED FILL — AMLODIPINE BESYLATE 5 MG TA: 5 | 90 days supply | Qty: 90 | Fill #2

## 2019-08-01 MED FILL — ROSUVASTATIN CALCIUM 10 MG: 10 | 90 days supply | Qty: 90 | Fill #0

## 2019-08-02 ENCOUNTER — Other Ambulatory Visit: Payer: 59 | Admitting: Internal Medicine

## 2019-08-02 DIAGNOSIS — M797 Fibromyalgia: Secondary | ICD-10-CM

## 2019-08-02 DIAGNOSIS — E119 Type 2 diabetes mellitus without complications: Secondary | ICD-10-CM | POA: Diagnosis not present

## 2019-08-02 DIAGNOSIS — K219 Gastro-esophageal reflux disease without esophagitis: Secondary | ICD-10-CM

## 2019-08-02 DIAGNOSIS — E782 Mixed hyperlipidemia: Secondary | ICD-10-CM

## 2019-08-02 DIAGNOSIS — E7800 Pure hypercholesterolemia, unspecified: Secondary | ICD-10-CM | POA: Diagnosis not present

## 2019-08-02 DIAGNOSIS — Z0000 Encounter for general adult medical examination without abnormal findings: Secondary | ICD-10-CM | POA: Diagnosis not present

## 2019-08-02 DIAGNOSIS — I10 Essential (primary) hypertension: Secondary | ICD-10-CM

## 2019-08-02 DIAGNOSIS — F3289 Other specified depressive episodes: Secondary | ICD-10-CM | POA: Diagnosis not present

## 2019-08-02 DIAGNOSIS — F5101 Primary insomnia: Secondary | ICD-10-CM

## 2019-08-02 DIAGNOSIS — E8881 Metabolic syndrome: Secondary | ICD-10-CM

## 2019-08-02 DIAGNOSIS — Z853 Personal history of malignant neoplasm of breast: Secondary | ICD-10-CM

## 2019-08-03 ENCOUNTER — Other Ambulatory Visit: Payer: Self-pay

## 2019-08-03 ENCOUNTER — Encounter: Payer: Self-pay | Admitting: Internal Medicine

## 2019-08-03 ENCOUNTER — Ambulatory Visit (INDEPENDENT_AMBULATORY_CARE_PROVIDER_SITE_OTHER): Payer: 59 | Admitting: Internal Medicine

## 2019-08-03 VITALS — BP 130/90 | HR 78 | Ht 60.0 in | Wt 160.0 lb

## 2019-08-03 DIAGNOSIS — E8881 Metabolic syndrome: Secondary | ICD-10-CM | POA: Diagnosis not present

## 2019-08-03 DIAGNOSIS — Z853 Personal history of malignant neoplasm of breast: Secondary | ICD-10-CM

## 2019-08-03 DIAGNOSIS — I10 Essential (primary) hypertension: Secondary | ICD-10-CM | POA: Diagnosis not present

## 2019-08-03 DIAGNOSIS — F5101 Primary insomnia: Secondary | ICD-10-CM

## 2019-08-03 DIAGNOSIS — M797 Fibromyalgia: Secondary | ICD-10-CM

## 2019-08-03 DIAGNOSIS — B009 Herpesviral infection, unspecified: Secondary | ICD-10-CM

## 2019-08-03 DIAGNOSIS — Z0000 Encounter for general adult medical examination without abnormal findings: Secondary | ICD-10-CM

## 2019-08-03 DIAGNOSIS — E782 Mixed hyperlipidemia: Secondary | ICD-10-CM

## 2019-08-03 DIAGNOSIS — Z86018 Personal history of other benign neoplasm: Secondary | ICD-10-CM

## 2019-08-03 DIAGNOSIS — R8290 Unspecified abnormal findings in urine: Secondary | ICD-10-CM

## 2019-08-03 DIAGNOSIS — F3289 Other specified depressive episodes: Secondary | ICD-10-CM | POA: Diagnosis not present

## 2019-08-03 LAB — CBC WITH DIFFERENTIAL/PLATELET
Absolute Monocytes: 377 cells/uL (ref 200–950)
Basophils Absolute: 61 cells/uL (ref 0–200)
Basophils Relative: 1.2 %
Eosinophils Absolute: 296 cells/uL (ref 15–500)
Eosinophils Relative: 5.8 %
HCT: 36 % (ref 35.0–45.0)
Hemoglobin: 11.7 g/dL (ref 11.7–15.5)
Lymphs Abs: 1301 cells/uL (ref 850–3900)
MCH: 28.7 pg (ref 27.0–33.0)
MCHC: 32.5 g/dL (ref 32.0–36.0)
MCV: 88.2 fL (ref 80.0–100.0)
MPV: 9.8 fL (ref 7.5–12.5)
Monocytes Relative: 7.4 %
Neutro Abs: 3065 cells/uL (ref 1500–7800)
Neutrophils Relative %: 60.1 %
Platelets: 373 10*3/uL (ref 140–400)
RBC: 4.08 10*6/uL (ref 3.80–5.10)
RDW: 13 % (ref 11.0–15.0)
Total Lymphocyte: 25.5 %
WBC: 5.1 10*3/uL (ref 3.8–10.8)

## 2019-08-03 LAB — POCT URINALYSIS DIPSTICK
Bilirubin, UA: NEGATIVE
Blood, UA: NEGATIVE
Glucose, UA: NEGATIVE
Ketones, UA: NEGATIVE
Nitrite, UA: NEGATIVE
Protein, UA: NEGATIVE
Spec Grav, UA: 1.01 (ref 1.010–1.025)
Urobilinogen, UA: 0.2 E.U./dL
pH, UA: 6.5 (ref 5.0–8.0)

## 2019-08-03 LAB — COMPLETE METABOLIC PANEL WITH GFR
AG Ratio: 1.4 (calc) (ref 1.0–2.5)
ALT: 23 U/L (ref 6–29)
AST: 25 U/L (ref 10–35)
Albumin: 3.9 g/dL (ref 3.6–5.1)
Alkaline phosphatase (APISO): 26 U/L — ABNORMAL LOW (ref 37–153)
BUN/Creatinine Ratio: 24 (calc) — ABNORMAL HIGH (ref 6–22)
BUN: 26 mg/dL — ABNORMAL HIGH (ref 7–25)
CO2: 23 mmol/L (ref 20–32)
Calcium: 9.7 mg/dL (ref 8.6–10.4)
Chloride: 106 mmol/L (ref 98–110)
Creat: 1.1 mg/dL — ABNORMAL HIGH (ref 0.50–0.99)
GFR, Est African American: 61 mL/min/{1.73_m2} (ref >=60)
GFR, Est Non African American: 53 mL/min/{1.73_m2} — ABNORMAL LOW (ref >=60)
Globulin: 2.7 g/dL (calc) (ref 1.9–3.7)
Glucose, Bld: 82 mg/dL (ref 65–99)
Potassium: 4.4 mmol/L (ref 3.5–5.3)
Sodium: 141 mmol/L (ref 135–146)
Total Bilirubin: 0.3 mg/dL (ref 0.2–1.2)
Total Protein: 6.6 g/dL (ref 6.1–8.1)

## 2019-08-03 LAB — TSH: TSH: 1.51 mIU/L (ref 0.40–4.50)

## 2019-08-03 LAB — LIPID PANEL
Cholesterol: 156 mg/dL (ref <200)
HDL: 72 mg/dL (ref >=50)
LDL Cholesterol (Calc): 68 mg/dL (calc)
Non-HDL Cholesterol (Calc): 84 mg/dL (calc) (ref <130)
Total CHOL/HDL Ratio: 2.2 (calc) (ref <5.0)
Triglycerides: 82 mg/dL (ref <150)

## 2019-08-03 LAB — HEMOGLOBIN A1C
Hgb A1c MFr Bld: 5.5 % of total Hgb (ref <5.7)
Mean Plasma Glucose: 111 (calc)
eAG (mmol/L): 6.2 (calc)

## 2019-08-03 MED ORDER — ACYCLOVIR 400 MG PO TABS: 400.0000 mg | ORAL_TABLET | Freq: Every day | ORAL | 1 refills | Status: DC

## 2019-08-03 MED FILL — ACYCLOVIR 400 MG TABLET: 400 | 18 days supply | Qty: 90 | Fill #0

## 2019-08-03 NOTE — Patient Instructions (Signed)
It was a pleasure today. Continue same meds and RTC in 6 months.

## 2019-08-03 NOTE — Progress Notes (Signed)
Subjective:    Patient ID: Jill Richardson, female    DOB: 06-18-1956, 64 y.o.   MRN: [id number]  HPI  64 year old Female for health maintenance exam and evaluation of medical issues.  Continues to work as an Engineer, production at [employer]. .  History of low back pain and lumbar fusion L5-S1 in 2005 with cages and pedicle screws by Dr. Carloyn Manner.  History of breast cancer, controlled type 2 diabetes mellitus, hyperlipidemia, GE reflux, fibromyalgia, obesity, history of SVT.  She is allergic to Lodine.  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  Able to tolerate Daypro for musculoskeletal pain.  Left oophorectomy March 1997.  Exploratory laparotomy for small bowel obstruction 1 month later.  History of dysplastic nevus of the shoulder 2008.  Longstanding history of fibromyalgia syndrome dating back to the 1990s.  In September 2019 she had a periumbilical hernia repair by Dr. Georgette Dover.  Had colonoscopy 2008 by Dr. Cristina Gong with an inflammatory polyp removed.  This was not adenomatous.  Had another colonoscopy in 2018 with another benign polyp removed.  Follow-up was recommended in 10 years.  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  She has had 2 pregnancies and no miscarriages.  Menarche at age 37.  Menopause around age 65.  She took estrogen replacement until her cancer diagnosis.  Tumor was ER/PR positive with evidence of lymphovascular invasion.  Nodes were negative.  She had radiation and lumpectomy.  Was treated with tamoxifen and Femara and has done well.  Takes flu vaccine through employment.  She takes Crestor for hyperlipidemia.  She takes Flexeril for back pain and Ativan for insomnia.  Occasionally takes tramadol or Norco for more severe back pain but seldom.  History of GE reflux treated with PPI.  She is on Metformin and Januvia for diabetes mellitus with good control.  Social history: She is married and has grandchildren.   Husband is disabled due to chronic brain injury after an accident with x-ray in the mid 1990s.  2 adult sons.  Family history: Father with history of diabetes and hypertension died from complications of heart failure.  Mother still living.  Brother with history of hypertension.  Sister in good health.      Review of Systems  Respiratory: Negative.   Cardiovascular: Negative.   Gastrointestinal: Negative.   Genitourinary: Negative.   Neurological: Negative.   Psychiatric/Behavioral: Negative.        Objective:   Physical Exam Vitals reviewed.  Constitutional:      General: She is not in acute distress.    Appearance: Normal appearance. She is not toxic-appearing or diaphoretic.  HENT:     Head: Normocephalic and atraumatic.     Right Ear: Tympanic membrane normal.     Left Ear: Tympanic membrane normal.     Nose: Nose normal.  Eyes:     General: No scleral icterus.       Right eye: No discharge.        Left eye: No discharge.     Extraocular Movements: Extraocular movements intact.     Conjunctiva/sclera: Conjunctivae normal.     Pupils: Pupils are equal, round, and reactive to light.  Neck:     Vascular: No carotid bruit.  Cardiovascular:     Rate and Rhythm: Normal rate and regular rhythm.     Pulses: Normal pulses.     Heart sounds: Normal heart sounds. No murmur.  Pulmonary:  Effort: Pulmonary effort is normal. No respiratory distress.     Breath sounds: Normal breath sounds. No wheezing or rales.  Abdominal:     General: Bowel sounds are normal. There is no distension.     Palpations: Abdomen is soft. There is no mass.     Tenderness: There is no abdominal tenderness. There is no guarding.  Genitourinary:    Comments: Deferred to GYN Musculoskeletal:     Cervical back: Neck supple. No rigidity.     Right lower leg: No edema.     Left lower leg: No edema.  Lymphadenopathy:     Cervical: No cervical adenopathy.  Skin:    General: Skin is warm and dry.   Neurological:     General: No focal deficit present.     Mental Status: She is alert and oriented to person, place, and time.     Cranial Nerves: No cranial nerve deficit.     Motor: No weakness.     Gait: Gait normal.  Psychiatric:        Mood and Affect: Mood normal.        Behavior: Behavior normal.        Thought Content: Thought content normal.        Judgment: Judgment normal.           Assessment & Plan:  Normal health maintenance exam  Essential hypertension-stable with amlodipine 5 mg daily, losartan 100 mg daily.  She has had some fluctuations recently with work or in the mornings before taking her medication.  Continue to monitor but no change in regimen.  Hyperlipidemia-lipid panel stable on fenofibrate 160 mg daily and Crestor 10 mg daily.  Type 2 diabetes mellitus-excellent hemoglobin A1c on Metformin 500 mg twice daily and Januvia 100 mg daily.  Given good Rx card if she is having trouble purchasing Januvia on upcoming new insurance.  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  Will prescribe Zovirax 400 mg daily for prophylaxis.  This can be continued indefinitely.  Is probably aggravated by mask wearing  History of breast cancer-no recurrence  History of back pain status post L5-S1 fusion in 2005 and stable  Insomnia treated with chronic sleep medication  History of depression stable with SSRI  Obesity-continue diet and exercise efforts  History of dysplastic nevus 2008  GE reflux treated with PPI  Metabolic syndrome  Plan: I am pleased with her lab results at present time.  Trace LE noted on urine dipstick.  Culture will be obtained but she is asymptomatic.  Return in 6 months or as needed.  Okay to refill medications for 6 months if pharmacy contact us.

## 2019-08-04 LAB — URINE CULTURE
MICRO NUMBER:: 10121293
SPECIMEN QUALITY:: ADEQUATE

## 2019-08-06 MED FILL — PANTOPRAZOLE SOD DR 40 MG T: 40 | 90 days supply | Qty: 90 | Fill #2

## 2019-08-07 MED FILL — VENLAFAXINE HCL ER 75 MG CA: 75 | 90 days supply | Qty: 90 | Fill #2

## 2019-09-03 MED FILL — JANUVIA 100 MG TABLET: 100 | 60 days supply | Qty: 60 | Fill #1

## 2019-10-01 MED FILL — HYDROCODON-APAP 5-325: 5-325 | 1 days supply | Qty: 6 | Fill #0

## 2019-10-01 MED FILL — AMOXICILLIN 500 MG CAPSULE: 500 | 7 days supply | Qty: 21 | Fill #0

## 2019-10-02 MED FILL — ACYCLOVIR 400 MG TABLET: 400 | 18 days supply | Qty: 90 | Fill #1

## 2019-10-15 MED FILL — FENOFIBRATE 160 MG TABLET: 160 | 90 days supply | Qty: 90 | Fill #1

## 2019-10-15 MED FILL — LOSARTAN POTASSIUM 100 MG T: 100 | 90 days supply | Qty: 90 | Fill #2

## 2019-10-15 MED FILL — OXAPROZIN 600 MG TABLET: 600 | 90 days supply | Qty: 180 | Fill #1

## 2019-10-29 ENCOUNTER — Other Ambulatory Visit: Payer: Self-pay | Admitting: Internal Medicine

## 2019-10-29 MED FILL — VENLAFAXINE HCL ER 75 MG CA: 75 | 90 days supply | Qty: 90 | Fill #3

## 2019-10-29 MED FILL — metFORMIN HCL 500 MG TABS: 500 | 90 days supply | Qty: 180 | Fill #0

## 2019-10-29 MED FILL — PANTOPRAZOLE SOD DR 40 MG T: 40 | 90 days supply | Qty: 90 | Fill #3

## 2019-10-29 MED FILL — LORazepam 2 MG TABS: 2 | 90 days supply | Qty: 90 | Fill #0

## 2019-10-29 MED FILL — ROSUVASTATIN CALCIUM 10 MG: 10 | 90 days supply | Qty: 90 | Fill #0

## 2019-11-22 MED FILL — AMOXICILLIN 500 MG CAPSULE: 500 | 7 days supply | Qty: 21 | Fill #0

## 2019-11-22 MED FILL — HYDROCODON-APAP 5-325: 5-325 | 2 days supply | Qty: 8 | Fill #0

## 2020-01-04 ENCOUNTER — Other Ambulatory Visit: Payer: Self-pay | Admitting: Internal Medicine

## 2020-01-04 MED FILL — JANUVIA 100 MG TABLET: 100 | 90 days supply | Qty: 90 | Fill #0

## 2020-01-07 ENCOUNTER — Telehealth: Payer: Self-pay | Admitting: Internal Medicine

## 2020-01-07 NOTE — Telephone Encounter (Signed)
Mail form to [facility] [place] [address]

## 2020-01-21 ENCOUNTER — Other Ambulatory Visit: Payer: Self-pay | Admitting: Internal Medicine

## 2020-01-21 MED FILL — FENOFIBRATE 160 MG TABLET: 160 | 90 days supply | Qty: 90 | Fill #2

## 2020-01-21 MED FILL — LOSARTAN POTASSIUM 100 MG T: 100 | 90 days supply | Qty: 90 | Fill #3

## 2020-01-23 ENCOUNTER — Other Ambulatory Visit: Payer: Self-pay | Admitting: Internal Medicine

## 2020-01-23 MED FILL — PANTOPRAZOLE SOD DR 40 MG T: 40 | 90 days supply | Qty: 90 | Fill #0

## 2020-01-23 MED FILL — ROSUVASTATIN CALCIUM 10 MG: 10 | 90 days supply | Qty: 90 | Fill #0

## 2020-02-04 ENCOUNTER — Other Ambulatory Visit: Payer: Self-pay | Admitting: Internal Medicine

## 2020-02-04 MED FILL — AMLODIPINE BESYLATE 5 MG TA: 5 | 90 days supply | Qty: 90 | Fill #0

## 2020-02-04 MED FILL — METFORMIN HCL 500 MG TABS: 500 | 90 days supply | Qty: 180 | Fill #0

## 2020-02-04 MED FILL — VENLAFAXINE HCL ER 75 MG CA: 75 | 90 days supply | Qty: 90 | Fill #0

## 2020-02-05 ENCOUNTER — Other Ambulatory Visit: Payer: PPO | Admitting: Internal Medicine

## 2020-02-05 ENCOUNTER — Other Ambulatory Visit: Payer: Self-pay

## 2020-02-05 DIAGNOSIS — E782 Mixed hyperlipidemia: Secondary | ICD-10-CM

## 2020-02-05 DIAGNOSIS — E119 Type 2 diabetes mellitus without complications: Secondary | ICD-10-CM | POA: Diagnosis not present

## 2020-02-06 LAB — LIPID PANEL
Cholesterol: 168 mg/dL (ref <200)
HDL: 73 mg/dL (ref >=50)
LDL Cholesterol (Calc): 77 mg/dL (calc)
Non-HDL Cholesterol (Calc): 95 mg/dL (calc) (ref <130)
Total CHOL/HDL Ratio: 2.3 (calc) (ref <5.0)
Triglycerides: 92 mg/dL (ref <150)

## 2020-02-06 LAB — HEPATIC FUNCTION PANEL
AG Ratio: 1.5 (calc) (ref 1.0–2.5)
ALT: 23 U/L (ref 6–29)
AST: 26 U/L (ref 10–35)
Albumin: 3.8 g/dL (ref 3.6–5.1)
Alkaline phosphatase (APISO): 25 U/L — ABNORMAL LOW (ref 37–153)
Bilirubin, Direct: 0.1 mg/dL (ref 0.0–0.2)
Globulin: 2.6 g/dL (calc) (ref 1.9–3.7)
Indirect Bilirubin: 0.4 mg/dL (calc) (ref 0.2–1.2)
Total Bilirubin: 0.5 mg/dL (ref 0.2–1.2)
Total Protein: 6.4 g/dL (ref 6.1–8.1)

## 2020-02-06 LAB — HEMOGLOBIN A1C
Hgb A1c MFr Bld: 5.5 % of total Hgb (ref <5.7)
Mean Plasma Glucose: 111 (calc)
eAG (mmol/L): 6.2 (calc)

## 2020-02-07 ENCOUNTER — Ambulatory Visit (INDEPENDENT_AMBULATORY_CARE_PROVIDER_SITE_OTHER): Payer: PPO | Admitting: Internal Medicine

## 2020-02-07 ENCOUNTER — Other Ambulatory Visit: Payer: Self-pay | Admitting: Internal Medicine

## 2020-02-07 ENCOUNTER — Encounter: Payer: Self-pay | Admitting: Internal Medicine

## 2020-02-07 ENCOUNTER — Other Ambulatory Visit: Payer: Self-pay

## 2020-02-07 VITALS — BP 128/80 | HR 80 | Ht 60.0 in | Wt 159.0 lb

## 2020-02-07 DIAGNOSIS — E785 Hyperlipidemia, unspecified: Secondary | ICD-10-CM | POA: Diagnosis not present

## 2020-02-07 DIAGNOSIS — E8881 Metabolic syndrome: Secondary | ICD-10-CM

## 2020-02-07 DIAGNOSIS — E1169 Type 2 diabetes mellitus with other specified complication: Secondary | ICD-10-CM

## 2020-02-07 DIAGNOSIS — I10 Essential (primary) hypertension: Secondary | ICD-10-CM

## 2020-02-07 DIAGNOSIS — B009 Herpesviral infection, unspecified: Secondary | ICD-10-CM | POA: Diagnosis not present

## 2020-02-07 DIAGNOSIS — Z853 Personal history of malignant neoplasm of breast: Secondary | ICD-10-CM | POA: Diagnosis not present

## 2020-02-07 DIAGNOSIS — M797 Fibromyalgia: Secondary | ICD-10-CM | POA: Diagnosis not present

## 2020-02-07 DIAGNOSIS — F5101 Primary insomnia: Secondary | ICD-10-CM | POA: Diagnosis not present

## 2020-02-07 MED ORDER — VALACYCLOVIR HCL 500 MG PO TABS: 500.0000 mg | ORAL_TABLET | Freq: Every day | ORAL | 3 refills | Status: DC

## 2020-02-07 MED FILL — ROSUVASTATIN CALCIUM 10 MG: 10 | 90 days supply | Qty: 90 | Fill #0

## 2020-02-07 MED FILL — PANTOPRAZOLE SOD DR 40 MG T: 40 | 90 days supply | Qty: 90 | Fill #0

## 2020-02-07 MED FILL — VALACYCLOVIR HCL 500 MG TAB: 500 | 90 days supply | Qty: 90 | Fill #0

## 2020-02-07 NOTE — Progress Notes (Signed)
   Subjective:    Patient ID: Jill Richardson, female    DOB: [birth year], 64 y.o.   MRN: [id number]  HPI  64 year old Female for 6 months. 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  Husband is having some medical issues with memory and will be evaluated soon. Spending time with grandchildren this summer.  Labs reviewed with her today.  Hemoglobin A1c 5.5% which is excellent.  Lipid panel is entirely within normal limits.  Liver panel within normal limits with exception of a low alkaline phosphatase of 25.  Mammogram done in February is normal.  Colonoscopy done 2018 at [facility] with follow-up recommended in 10 years.  One inflammatory polyp removed.    Review of Systems no new complaints     Objective:   Physical Exam vital signs reviewed Contusion right arm. Chest clear to auscultation.  Blood pressure 128/80 pulse 80 pulse oximetry 98% weight 159 pounds BMI 31.05 Skin warm and dry.  Nodes none.  Neck is supple without JVD thyromegaly or carotid bruits.  Chest clear to auscultation.  Cardiac exam regular rate and rhythm.  No lower extremity pitting edema.      Assessment & Plan:  Controlled type 2 diabetes mellitus with excellent hemoglobin A1c  Essential hypertension-blood pressure normal on current regimen.  This will not be changed.  BMI 31.05-continue to work on diet and exercise  History of breast cancer-recent mammogram normal  Health maintenance-colonoscopy is up-to-date.  History of primary insomnia-continue lorazepam  HSV type I-Valtrex prescribed for outbreaks  Time spent reviewing records, reviewing results, seeing patient and medical decision making is 30 minutes

## 2020-02-13 MED ORDER — VALACYCLOVIR HCL 500 MG PO TABS: 500.0000 mg | ORAL_TABLET | Freq: Every day | ORAL | 11 refills | Status: AC

## 2020-02-13 NOTE — Patient Instructions (Signed)
It was a pleasure to see you today.  Blood pressure, lipids and glucose control are excellent.  Continue current medications and return in 6 months.  Valtrex prescribed for outbreaks of HSV type I.

## 2020-03-10 MED FILL — CHLORHEXIDINE 0.12% RINSE: 0.12 | 16 days supply | Qty: 473 | Fill #0

## 2020-04-16 ENCOUNTER — Other Ambulatory Visit: Payer: Self-pay | Admitting: Internal Medicine

## 2020-04-16 MED FILL — LORazepam 2 MG TABS: 2 | 90 days supply | Qty: 90 | Fill #1

## 2020-04-16 MED FILL — LOSARTAN POTASSIUM 100 MG T: 100 | 90 days supply | Qty: 90 | Fill #0

## 2020-04-16 MED FILL — OXAPROZIN 600 MG TABLET: 600 | 90 days supply | Qty: 180 | Fill #0

## 2020-04-25 MED FILL — FENOFIBRATE 160 MG TABLET: 160 | 90 days supply | Qty: 90 | Fill #3

## 2020-04-25 MED FILL — OXAPROZIN 600 MG TABLET: 600 | 90 days supply | Qty: 180 | Fill #0

## 2020-05-09 ENCOUNTER — Other Ambulatory Visit: Payer: Self-pay | Admitting: Internal Medicine

## 2020-05-09 MED FILL — PANTOPRAZOLE SOD DR 40 MG T: 40 | 90 days supply | Qty: 90 | Fill #1

## 2020-05-09 MED FILL — AMLODIPINE BESYLATE 5 MG TA: 5 | 90 days supply | Qty: 90 | Fill #1

## 2020-05-09 MED FILL — VALACYCLOVIR HCL 500 MG TAB: 500 | 90 days supply | Qty: 90 | Fill #1

## 2020-05-09 MED FILL — VENLAFAXINE HCL ER 75 MG CA: 75 | 90 days supply | Qty: 90 | Fill #1

## 2020-05-09 MED FILL — ROSUVASTATIN CALCIUM 10 MG: 10 | 90 days supply | Qty: 90 | Fill #0

## 2020-05-09 MED FILL — METFORMIN HCL 500 MG TABS: 500 | 90 days supply | Qty: 180 | Fill #0

## 2020-05-26 ENCOUNTER — Telehealth: Payer: Self-pay

## 2020-05-26 NOTE — Telephone Encounter (Signed)
Can she come at 10:30 in the morning?

## 2020-05-26 NOTE — Telephone Encounter (Signed)
[address] three weeks ago and injured left hip, has a hematoma it hurts she can walk okay. She would like for you to look at it.

## 2020-05-27 ENCOUNTER — Ambulatory Visit (INDEPENDENT_AMBULATORY_CARE_PROVIDER_SITE_OTHER): Payer: PPO | Admitting: Internal Medicine

## 2020-05-27 ENCOUNTER — Other Ambulatory Visit: Payer: Self-pay | Admitting: Internal Medicine

## 2020-05-27 ENCOUNTER — Other Ambulatory Visit: Payer: Self-pay

## 2020-05-27 ENCOUNTER — Encounter: Payer: Self-pay | Admitting: Internal Medicine

## 2020-05-27 VITALS — BP 120/80 | HR 70 | Temp 98.1°F | Ht 60.0 in | Wt 162.0 lb

## 2020-05-27 DIAGNOSIS — S300XXA Contusion of lower back and pelvis, initial encounter: Secondary | ICD-10-CM

## 2020-05-27 MED ORDER — HYDROCODONE-ACETAMINOPHEN 10-325 MG PO TABS: ORAL_TABLET | ORAL | 0 refills | Status: DC

## 2020-05-27 MED FILL — HYDROCODON-APAP 10-325: 10-325 | 5 days supply | Qty: 15 | Fill #0

## 2020-05-27 NOTE — Patient Instructions (Addendum)
Warm hot compresses to left buttock 20 minutes 2-3 times daily.  Take Norco 10/325 sparingly 1/2 to 1 tablet every 8 hours with food as needed for severe pain.  It could take 6 weeks for hematoma to resolve.

## 2020-05-27 NOTE — Progress Notes (Signed)
   Subjective:    Patient ID: Jill Richardson, female    DOB: Feb 28, 1956, 64 y.o.   MRN: [id number]  HPI   Three weeks ago patient was mowing with a riding lawn mower and suffered a fall from the mower  while it was parked. She fell to the left and landed on her left buttock.  The fall resulted in a large hematoma of the left buttock which has been slow to resolve.  It was extremely painful.  It has begun to recede but she was concerned that it had not completely resolved after 3 weeks.  She wondered if area had become an abscess and might need to be drained  She is going on vacation next week to [place].  Would like to feel better.  Having considerable amount of pain still.  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    Review of Systems-no fever chills nausea or vomiting     Objective:   Physical Exam Patient is afebrile.  Weight 162 pounds height 5 feet 0 inches  Left buttock has approximate 6 cm x 5 cm area of resolving hematoma.  It is not red.  No significant bruising at this point in time.  No increased warmth.  Slight tenderness to palpation.       Assessment & Plan:  Resolving hematoma left buttock secondary to a fall  Plan: Given Norco 10/325 (#15)  tabs to take 1/2-1 every 8 hours as needed for pain.  Warm hot compresses will help I think.  This should be applied for 20 minutes 2 or 3 times daily.  It could take as long as 6 weeks for this to resolve.

## 2020-05-27 NOTE — Telephone Encounter (Signed)
She said she can not she can come at 12:00pm

## 2020-07-03 ENCOUNTER — Other Ambulatory Visit: Payer: Self-pay | Admitting: Adult Health

## 2020-07-03 DIAGNOSIS — Z1231 Encounter for screening mammogram for malignant neoplasm of breast: Secondary | ICD-10-CM

## 2020-07-23 ENCOUNTER — Other Ambulatory Visit: Payer: Self-pay | Admitting: Internal Medicine

## 2020-07-23 MED FILL — FENOFIBRATE 160 MG TABLET: 160 | 90 days supply | Qty: 90 | Fill #0

## 2020-07-23 MED FILL — LOSARTAN POTASSIUM 100 MG T: 100 | 30 days supply | Qty: 30 | Fill #1

## 2020-07-25 LAB — HM DIABETES EYE EXAM

## 2020-07-28 ENCOUNTER — Encounter: Payer: Self-pay | Admitting: Internal Medicine

## 2020-07-31 ENCOUNTER — Other Ambulatory Visit: Payer: PPO | Admitting: Internal Medicine

## 2020-07-31 ENCOUNTER — Other Ambulatory Visit: Payer: Self-pay

## 2020-07-31 DIAGNOSIS — Z86018 Personal history of other benign neoplasm: Secondary | ICD-10-CM | POA: Diagnosis not present

## 2020-07-31 DIAGNOSIS — I10 Essential (primary) hypertension: Secondary | ICD-10-CM | POA: Diagnosis not present

## 2020-07-31 DIAGNOSIS — Z853 Personal history of malignant neoplasm of breast: Secondary | ICD-10-CM

## 2020-07-31 DIAGNOSIS — Z0000 Encounter for general adult medical examination without abnormal findings: Secondary | ICD-10-CM | POA: Diagnosis not present

## 2020-07-31 DIAGNOSIS — E782 Mixed hyperlipidemia: Secondary | ICD-10-CM

## 2020-07-31 DIAGNOSIS — B009 Herpesviral infection, unspecified: Secondary | ICD-10-CM | POA: Diagnosis not present

## 2020-07-31 DIAGNOSIS — E785 Hyperlipidemia, unspecified: Secondary | ICD-10-CM

## 2020-07-31 DIAGNOSIS — M797 Fibromyalgia: Secondary | ICD-10-CM | POA: Diagnosis not present

## 2020-07-31 DIAGNOSIS — E8881 Metabolic syndrome: Secondary | ICD-10-CM | POA: Diagnosis not present

## 2020-07-31 DIAGNOSIS — E1169 Type 2 diabetes mellitus with other specified complication: Secondary | ICD-10-CM

## 2020-07-31 DIAGNOSIS — F5101 Primary insomnia: Secondary | ICD-10-CM | POA: Diagnosis not present

## 2020-08-01 LAB — COMPLETE METABOLIC PANEL WITH GFR
AG Ratio: 1.5 (calc) (ref 1.0–2.5)
ALT: 25 U/L (ref 6–29)
AST: 34 U/L (ref 10–35)
Albumin: 3.9 g/dL (ref 3.6–5.1)
Alkaline phosphatase (APISO): 28 U/L — ABNORMAL LOW (ref 37–153)
BUN/Creatinine Ratio: 25 (calc) — ABNORMAL HIGH (ref 6–22)
BUN: 27 mg/dL — ABNORMAL HIGH (ref 7–25)
CO2: 25 mmol/L (ref 20–32)
Calcium: 9.4 mg/dL (ref 8.6–10.4)
Chloride: 106 mmol/L (ref 98–110)
Creat: 1.06 mg/dL — ABNORMAL HIGH (ref 0.50–0.99)
GFR, Est African American: 63 mL/min/{1.73_m2} (ref >=60)
GFR, Est Non African American: 55 mL/min/{1.73_m2} — ABNORMAL LOW (ref >=60)
Globulin: 2.6 g/dL (calc) (ref 1.9–3.7)
Glucose, Bld: 75 mg/dL (ref 65–99)
Potassium: 4.3 mmol/L (ref 3.5–5.3)
Sodium: 142 mmol/L (ref 135–146)
Total Bilirubin: 0.4 mg/dL (ref 0.2–1.2)
Total Protein: 6.5 g/dL (ref 6.1–8.1)

## 2020-08-01 LAB — CBC WITH DIFFERENTIAL/PLATELET
Absolute Monocytes: 336 cells/uL (ref 200–950)
Basophils Absolute: 80 cells/uL (ref 0–200)
Basophils Relative: 1.9 %
Eosinophils Absolute: 227 cells/uL (ref 15–500)
Eosinophils Relative: 5.4 %
HCT: 36.6 % (ref 35.0–45.0)
Hemoglobin: 12.1 g/dL (ref 11.7–15.5)
Lymphs Abs: 1390 cells/uL (ref 850–3900)
MCH: 30.4 pg (ref 27.0–33.0)
MCHC: 33.1 g/dL (ref 32.0–36.0)
MCV: 92 fL (ref 80.0–100.0)
MPV: 10 fL (ref 7.5–12.5)
Monocytes Relative: 8 %
Neutro Abs: 2167 cells/uL (ref 1500–7800)
Neutrophils Relative %: 51.6 %
Platelets: 346 10*3/uL (ref 140–400)
RBC: 3.98 10*6/uL (ref 3.80–5.10)
RDW: 12.9 % (ref 11.0–15.0)
Total Lymphocyte: 33.1 %
WBC: 4.2 10*3/uL (ref 3.8–10.8)

## 2020-08-01 LAB — HEMOGLOBIN A1C
Hgb A1c MFr Bld: 5.5 % of total Hgb (ref <5.7)
Mean Plasma Glucose: 111 mg/dL
eAG (mmol/L): 6.2 mmol/L

## 2020-08-01 LAB — LIPID PANEL
Cholesterol: 165 mg/dL (ref <200)
HDL: 77 mg/dL (ref >=50)
LDL Cholesterol (Calc): 70 mg/dL (calc)
Non-HDL Cholesterol (Calc): 88 mg/dL (calc) (ref <130)
Total CHOL/HDL Ratio: 2.1 (calc) (ref <5.0)
Triglycerides: 96 mg/dL (ref <150)

## 2020-08-01 LAB — TSH: TSH: 2.08 mIU/L (ref 0.40–4.50)

## 2020-08-05 ENCOUNTER — Telehealth: Payer: Self-pay | Admitting: Internal Medicine

## 2020-08-05 ENCOUNTER — Ambulatory Visit (INDEPENDENT_AMBULATORY_CARE_PROVIDER_SITE_OTHER): Payer: PPO | Admitting: Internal Medicine

## 2020-08-05 ENCOUNTER — Other Ambulatory Visit: Payer: Self-pay | Admitting: Internal Medicine

## 2020-08-05 ENCOUNTER — Encounter: Payer: Self-pay | Admitting: Internal Medicine

## 2020-08-05 ENCOUNTER — Other Ambulatory Visit: Payer: Self-pay

## 2020-08-05 VITALS — BP 110/80 | HR 93 | Ht 59.5 in | Wt 157.0 lb

## 2020-08-05 DIAGNOSIS — E785 Hyperlipidemia, unspecified: Secondary | ICD-10-CM

## 2020-08-05 DIAGNOSIS — I10 Essential (primary) hypertension: Secondary | ICD-10-CM | POA: Diagnosis not present

## 2020-08-05 DIAGNOSIS — M797 Fibromyalgia: Secondary | ICD-10-CM

## 2020-08-05 DIAGNOSIS — Z853 Personal history of malignant neoplasm of breast: Secondary | ICD-10-CM

## 2020-08-05 DIAGNOSIS — Z0000 Encounter for general adult medical examination without abnormal findings: Secondary | ICD-10-CM

## 2020-08-05 DIAGNOSIS — F5101 Primary insomnia: Secondary | ICD-10-CM | POA: Diagnosis not present

## 2020-08-05 DIAGNOSIS — E1169 Type 2 diabetes mellitus with other specified complication: Secondary | ICD-10-CM | POA: Diagnosis not present

## 2020-08-05 DIAGNOSIS — F3289 Other specified depressive episodes: Secondary | ICD-10-CM | POA: Diagnosis not present

## 2020-08-05 DIAGNOSIS — E8881 Metabolic syndrome: Secondary | ICD-10-CM

## 2020-08-05 LAB — POCT URINALYSIS DIPSTICK
Appearance: NEGATIVE
Bilirubin, UA: NEGATIVE
Blood, UA: NEGATIVE
Glucose, UA: NEGATIVE
Ketones, UA: NEGATIVE
Leukocytes, UA: NEGATIVE
Nitrite, UA: NEGATIVE
Odor: NEGATIVE
Protein, UA: NEGATIVE
Spec Grav, UA: 1.015 (ref 1.010–1.025)
Urobilinogen, UA: 0.2 E.U./dL
pH, UA: 6.5 (ref 5.0–8.0)

## 2020-08-05 MED ORDER — TRAMADOL HCL 50 MG PO TABS: 50.0000 mg | ORAL_TABLET | Freq: Three times a day (TID) | ORAL | 0 refills | Status: DC | PRN

## 2020-08-05 MED ORDER — CYCLOBENZAPRINE HCL 10 MG PO TABS: 10.0000 mg | ORAL_TABLET | Freq: Three times a day (TID) | ORAL | 0 refills | Status: DC | PRN

## 2020-08-05 MED FILL — traMADol HCL 50 MG TABS: 50 | 10 days supply | Qty: 30 | Fill #0

## 2020-08-05 MED FILL — CYCLOBENZAPRINE HCL 10 MG T: 10 | 10 days supply | Qty: 30 | Fill #0

## 2020-08-05 NOTE — Telephone Encounter (Signed)
Mailed completed Form  For Januvia [place] [address]

## 2020-08-05 NOTE — Patient Instructions (Addendum)
We will order Pneumococcal 23 vaccine for you. Apologize that we are out of this today. Labs are excellent. Tramadol refilled. Please check with Employee Health about date of Tdap. RTC in 6 months.

## 2020-08-05 NOTE — Progress Notes (Signed)
Subjective:    Patient ID: Jill Richardson, female    DOB: 1956/03/29, 65 y.o.   MRN: [id number]  HPI 65 year old Female seen for Welcome to Medicare exam, health maintenance exam and evaluation of medical issues.  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 Enjoying time with family.  Having cataract surgery right eye soon.  History of diabetes mellitus, history of fibromyalgia syndrome dating back to the 1990s.  History of dysplastic nevus of the shoulder 2008.  Left oophorectomy March 1997.  Exploratory laparotomy for small bowel obstruction 1 month later.  History of low back pain and lumbar fusion L5-S1 in 2005 with cages and pedicle screws by Dr. Carloyn Manner.  Remote history of breast cancer with no recurrence.  History of type 2 diabetes mellitus-needs Januvia assistance form completed.  History of hyperlipidemia, GE reflux and history of SVT.  In September [age] had periumbilical hernia repair by Dr. Georgette Dover.  Had colonoscopy 2008 by Dr. Cristina Gong with an inflammatory polyp removed.  This was not adenomatous.  Had another colonoscopy in 2018 with another benign polyp removed.  Follow-up was recommended in 10 years.  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  She has had 2 pregnancies and no miscarriages.  Menarche at age 57.  Menopause around age 71.  She took estrogen replacement until her cancer diagnosis.  Tumor was ER/PR positive with evidence of lymphovascular invasion.  Nodes were negative.  She had radiation and lumpectomy.  Was treated with tamoxifen and Femara and has done well.  Takes Flexeril for back pain in addition to tramadol occasionally or Norco if severe back pain which is seldom.  Takes Crestor for hyperlipidemia and takes PPI for GE reflux.  She is on Metformin and Januvia for diabetes with good control.  Social history:  She is married and has grandchildren.  Husband is disabled due to a chronic brain injury after an accident in the mid 50s.  2 adult sons.  Family history: Father with history of diabetes and hypertension died from complications of heart failure.  Mother still living.  Brother with history of hypertension.  Sister in good health.  Has had 3 COVID-19 vaccines and annual flu vaccine.  Needs update on pneumococcal 23 but we are out today and this will be done at a later date in the near future.  Tetanus immunization last done in 2009 but Medicare would not pay for it unless she is injured.  She might be able to get it through [facility] [place].    Review of Systems  Constitutional: Negative.   HENT: Negative.   Respiratory: Negative.   Cardiovascular:       Remote history of SVT  Gastrointestinal:       History of GE reflux  Endocrine: Negative.   Genitourinary: Negative.   Musculoskeletal: Positive for arthralgias and back pain.  Neurological: Negative.   Psychiatric/Behavioral: Negative.        Objective:   Physical Exam Blood pressure 110/80, pulse 93 regular pulse oximetry 97% weight 157 pounds, BMI 31.18.  Has lost 3 pounds from February 2021.  Skin: Warm and dry.  No cervical adenopathy.  No thyromegaly.  No carotid bruits.  Chest is clear to auscultation.  Cardiac exam: Regular rate and rhythm normal S1 and S2 without murmurs or gallops.  Abdomen  is soft, nondistended without hepatosplenomegaly masses or tenderness.  No lower extremity pitting edema.  Neuro: Intact without focal deficits.  Affect felt judgment are normal.       Assessment & Plan:  Essential hypertension-under excellent control on current regimen.  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  Treated with Zovirax 400 mg daily for prophylaxis continue indefinitely.  Plan: Continue current medications and follow-up in 6 months.  Welcome to Medicare EKG is normal.  Will order pneumococcal 23 for her and have her return in the near future.  Subjective:   Patient presents for Medicare Annual/Subsequent preventive examination.  Review Past Medical/Family/Social:   Risk Factors  Current exercise habits: Light Dietary issues discussed: Low-fat low carbohydrate discussed  Cardiac risk factors: Glucose intolerance, hyperlipidemia, family history  Depression Screen  (Note: if answer to either of the following is "Yes", a more complete depression screening is indicated)   Over the past two weeks, have you felt down, depressed or hopeless? No  Over the past two weeks, have you felt little interest or pleasure in doing things? No Have you lost interest or pleasure in daily life? No Do you often feel hopeless? No Do you cry easily over simple problems? No   Activities of Daily Living  In your present state of health, do you have any difficulty performing the following activities?:   Driving? No  Managing money? No  Feeding yourself? No  Getting from bed to chair? No  Climbing a flight of stairs? No  Preparing food and eating?: No  Bathing or showering? No  Getting dressed: No  Getting to the toilet? No  Using the toilet:No  Moving around from place to place: No  In the past year have you fallen or had a near fall?: Are you sexually active? No  Do you have  more than one partner? No   Hearing Difficulties: No  Do you often ask people to speak up or repeat themselves? No  Do you experience ringing or noises in your ears? No  Do you have difficulty understanding soft or whispered voices? No  Do you feel that you have a problem with memory? No Do you often misplace items? No    Home Safety:  Do you have a smoke alarm at your residence?  No Do you have grab bars in the bathroom?  Yes Do you have throw rugs in your house?  No   Cognitive Testing  Alert? Yes Normal Appearance?Yes  Oriented to person? Yes Place? Yes  Time? Yes  Recall of three objects? Yes  Can perform simple calculations? Yes  Displays appropriate judgment?Yes  Can read the correct time from a watch face?Yes   List the Names of Other Physician/Practitioners you currently use:  See referral list for the physicians patient is currently seeing.     Review of Systems: See above   Objective:     General appearance: Seen  in no acute distress Head: Normocephalic, without obvious abnormality, atraumatic  Eyes: conj clear, EOMi PEERLA  Ears: normal TM's and external ear canals both ears  Nose: Nares normal. Septum midline. Mucosa normal. No drainage or sinus tenderness.  Throat: lips, mucosa, and tongue normal; teeth and gums normal  Neck: no adenopathy, no carotid bruit, no JVD, supple, symmetrical, trachea midline and thyroid not enlarged, symmetric, no tenderness/mass/nodules  No CVA tenderness.  Lungs: clear to auscultation bilaterally  Breasts: No masses appreciated Heart: regular rate and rhythm, S1, S2 normal, no murmur, click, rub or gallop  Abdomen: soft, non-tender; bowel sounds normal; no masses, no organomegaly  Musculoskeletal: ROM normal in all joints, no crepitus, no deformity, Normal muscle strengthen. Back  is symmetric, no curvature. Skin: Skin color, texture, turgor normal. No rashes or lesions  Lymph nodes: Cervical, supraclavicular, and axillary  nodes normal.  Neurologic: CN 2 -12 Normal, Normal symmetric reflexes. Normal coordination and gait  Psych: Alert & Oriented x 3, Mood appear stable.    Assessment:    Annual wellness medicare exam   Plan:    During the course of the visit the patient was educated and counseled about appropriate screening and preventive services including:  Check with employee health regarding your tetanus update  Pneumococcal 23 to be given in the near future  COVID-19 vaccines up-to-date as is flu vaccine      Patient Instructions (the written plan) was given to the patient.  Medicare Attestation  I have personally reviewed:  The patient's medical and social history  Their use of alcohol, tobacco or illicit drugs  Their current medications and supplements  The patient's functional ability including ADLs,fall risks, home safety risks, cognitive, and hearing and visual impairment  Diet and physical activities  Evidence for depression or mood disorders  The patient's weight, height, BMI, and visual acuity have been recorded in the chart. I have made referrals, counseling, and provided education to the patient based on review of the above and I have provided the patient with a written personalized care plan for preventive services.

## 2020-08-06 MED FILL — AMLODIPINE BESYLATE 5 MG TA: 5 | 90 days supply | Qty: 90 | Fill #2

## 2020-08-06 MED FILL — VENLAFAXINE HCL ER 75 MG CA: 75 | 90 days supply | Qty: 90 | Fill #2

## 2020-08-06 MED FILL — VALACYCLOVIR HCL 500 MG TAB: 500 | 90 days supply | Qty: 90 | Fill #2

## 2020-08-06 MED FILL — ROSUVASTATIN CALCIUM 10 MG: 10 | 90 days supply | Qty: 90 | Fill #1

## 2020-08-06 MED FILL — PANTOPRAZOLE SOD DR 40 MG T: 40 | 90 days supply | Qty: 90 | Fill #2

## 2020-08-06 MED FILL — METFORMIN HCL 500 MG TABS: 500 | 90 days supply | Qty: 180 | Fill #1

## 2020-08-12 ENCOUNTER — Ambulatory Visit (INDEPENDENT_AMBULATORY_CARE_PROVIDER_SITE_OTHER): Payer: PPO | Admitting: Internal Medicine

## 2020-08-12 ENCOUNTER — Ambulatory Visit
Admission: RE | Admit: 2020-08-12 | Discharge: 2020-08-12 | Disposition: A | Discharge status: Home / Self Care | Payer: PPO | Source: Ambulatory Visit | Attending: Adult Health | Admitting: Adult Health

## 2020-08-12 ENCOUNTER — Encounter: Payer: Self-pay | Admitting: Internal Medicine

## 2020-08-12 ENCOUNTER — Other Ambulatory Visit: Payer: Self-pay

## 2020-08-12 VITALS — BP 138/88 | HR 77 | Temp 97.9°F | Ht 59.5 in | Wt 157.0 lb

## 2020-08-12 DIAGNOSIS — Z23 Encounter for immunization: Secondary | ICD-10-CM

## 2020-08-12 DIAGNOSIS — Z1231 Encounter for screening mammogram for malignant neoplasm of breast: Secondary | ICD-10-CM

## 2020-08-12 NOTE — Progress Notes (Signed)
Pneumococcal 23 vaccine given by CMA

## 2020-08-12 NOTE — Patient Instructions (Signed)
Patient received a pneumococcal vaccine 23 IM.

## 2020-08-22 DIAGNOSIS — H18413 Arcus senilis, bilateral: Secondary | ICD-10-CM | POA: Diagnosis not present

## 2020-08-22 DIAGNOSIS — H25013 Cortical age-related cataract, bilateral: Secondary | ICD-10-CM | POA: Diagnosis not present

## 2020-08-22 DIAGNOSIS — H2512 Age-related nuclear cataract, left eye: Secondary | ICD-10-CM | POA: Diagnosis not present

## 2020-08-22 DIAGNOSIS — H2513 Age-related nuclear cataract, bilateral: Secondary | ICD-10-CM | POA: Diagnosis not present

## 2020-08-22 DIAGNOSIS — H25043 Posterior subcapsular polar age-related cataract, bilateral: Secondary | ICD-10-CM | POA: Diagnosis not present

## 2020-08-22 MED FILL — LOSARTAN POTASSIUM 100 MG T: 100 | 30 days supply | Qty: 30 | Fill #2

## 2020-08-22 NOTE — Telephone Encounter (Signed)
Patient was denied for help, the first time and was told to apply again and would likely be approved the 2nd time. So we have completed the form again and mailed to [facility].

## 2020-09-17 DIAGNOSIS — H2512 Age-related nuclear cataract, left eye: Secondary | ICD-10-CM | POA: Diagnosis not present

## 2020-09-18 DIAGNOSIS — H2511 Age-related nuclear cataract, right eye: Secondary | ICD-10-CM | POA: Diagnosis not present

## 2020-09-23 MED FILL — LOSARTAN POTASSIUM 100 MG T: 100 | 30 days supply | Qty: 30 | Fill #3

## 2020-09-26 LAB — HM DIABETES EYE EXAM

## 2020-10-04 MED FILL — Oxaprozin Tab 600 MG: ORAL | 90 days supply | Qty: 180 | Fill #0 | Status: AC

## 2020-10-06 ENCOUNTER — Other Ambulatory Visit (HOSPITAL_COMMUNITY): Payer: Self-pay

## 2020-10-08 DIAGNOSIS — H2511 Age-related nuclear cataract, right eye: Secondary | ICD-10-CM | POA: Diagnosis not present

## 2020-10-27 ENCOUNTER — Other Ambulatory Visit (HOSPITAL_COMMUNITY): Payer: Self-pay

## 2020-10-27 MED FILL — Losartan Potassium Tab 100 MG: ORAL | 90 days supply | Qty: 90 | Fill #0 | Status: AC

## 2020-10-27 MED FILL — Fenofibrate Tab 160 MG: ORAL | 90 days supply | Qty: 90 | Fill #0 | Status: AC

## 2020-11-02 MED FILL — Metformin HCl Tab 500 MG: ORAL | 90 days supply | Qty: 180 | Fill #0 | Status: AC

## 2020-11-02 MED FILL — Venlafaxine HCl Cap ER 24HR 75 MG (Base Equivalent): ORAL | 90 days supply | Qty: 90 | Fill #0 | Status: AC

## 2020-11-02 MED FILL — Pantoprazole Sodium EC Tab 40 MG (Base Equiv): ORAL | 90 days supply | Qty: 90 | Fill #0 | Status: AC

## 2020-11-02 MED FILL — Amlodipine Besylate Tab 5 MG (Base Equivalent): ORAL | 90 days supply | Qty: 90 | Fill #0 | Status: AC

## 2020-11-02 MED FILL — Rosuvastatin Calcium Tab 10 MG: ORAL | 90 days supply | Qty: 90 | Fill #0 | Status: AC

## 2020-11-02 MED FILL — Valacyclovir HCl Tab 500 MG: ORAL | 90 days supply | Qty: 90 | Fill #0 | Status: AC

## 2020-11-03 ENCOUNTER — Telehealth: Payer: Self-pay | Admitting: Internal Medicine

## 2020-11-03 ENCOUNTER — Other Ambulatory Visit (HOSPITAL_COMMUNITY): Payer: Self-pay

## 2020-11-03 NOTE — Telephone Encounter (Signed)
Jill Richardson [phone]  Dora called to say she has place on Right Upper thigh that is red, painful and hard, has been there for about 4 days, she thinks it might need to be lanced.

## 2020-11-03 NOTE — Telephone Encounter (Signed)
We are not able to do that. Suggest ED or urgent care. Perhaps [facility] would be less busy if convenient for her or new ED on [address].

## 2020-11-03 NOTE — Telephone Encounter (Signed)
Called patient to let her know that Dr Renold Genta does not lance places and she would need to go to ED or Urgent Care, she verbalized understanding.

## 2021-01-21 MED FILL — Losartan Potassium Tab 100 MG: ORAL | 90 days supply | Qty: 90 | Fill #1 | Status: AC

## 2021-01-22 ENCOUNTER — Other Ambulatory Visit (HOSPITAL_COMMUNITY): Payer: Self-pay

## 2021-02-02 ENCOUNTER — Other Ambulatory Visit: Payer: Self-pay

## 2021-02-02 ENCOUNTER — Other Ambulatory Visit: Payer: PPO | Admitting: Internal Medicine

## 2021-02-02 DIAGNOSIS — E119 Type 2 diabetes mellitus without complications: Secondary | ICD-10-CM | POA: Diagnosis not present

## 2021-02-02 DIAGNOSIS — E785 Hyperlipidemia, unspecified: Secondary | ICD-10-CM | POA: Diagnosis not present

## 2021-02-02 DIAGNOSIS — E782 Mixed hyperlipidemia: Secondary | ICD-10-CM | POA: Diagnosis not present

## 2021-02-02 DIAGNOSIS — E1169 Type 2 diabetes mellitus with other specified complication: Secondary | ICD-10-CM | POA: Diagnosis not present

## 2021-02-03 ENCOUNTER — Encounter: Payer: Self-pay | Admitting: Internal Medicine

## 2021-02-03 ENCOUNTER — Other Ambulatory Visit (HOSPITAL_COMMUNITY): Payer: Self-pay

## 2021-02-03 ENCOUNTER — Ambulatory Visit (INDEPENDENT_AMBULATORY_CARE_PROVIDER_SITE_OTHER): Payer: PPO | Admitting: Internal Medicine

## 2021-02-03 VITALS — BP 120/80 | HR 76 | Ht 59.5 in | Wt 160.0 lb

## 2021-02-03 DIAGNOSIS — Z853 Personal history of malignant neoplasm of breast: Secondary | ICD-10-CM | POA: Diagnosis not present

## 2021-02-03 DIAGNOSIS — E8881 Metabolic syndrome: Secondary | ICD-10-CM | POA: Diagnosis not present

## 2021-02-03 DIAGNOSIS — M7501 Adhesive capsulitis of right shoulder: Secondary | ICD-10-CM | POA: Diagnosis not present

## 2021-02-03 DIAGNOSIS — E785 Hyperlipidemia, unspecified: Secondary | ICD-10-CM

## 2021-02-03 DIAGNOSIS — M797 Fibromyalgia: Secondary | ICD-10-CM | POA: Diagnosis not present

## 2021-02-03 DIAGNOSIS — I10 Essential (primary) hypertension: Secondary | ICD-10-CM

## 2021-02-03 DIAGNOSIS — F3289 Other specified depressive episodes: Secondary | ICD-10-CM

## 2021-02-03 DIAGNOSIS — E1169 Type 2 diabetes mellitus with other specified complication: Secondary | ICD-10-CM | POA: Diagnosis not present

## 2021-02-03 DIAGNOSIS — F5101 Primary insomnia: Secondary | ICD-10-CM

## 2021-02-03 LAB — HEPATIC FUNCTION PANEL
AG Ratio: 1.3 (calc) (ref 1.0–2.5)
ALT: 34 U/L — ABNORMAL HIGH (ref 6–29)
AST: 37 U/L — ABNORMAL HIGH (ref 10–35)
Albumin: 3.5 g/dL — ABNORMAL LOW (ref 3.6–5.1)
Alkaline phosphatase (APISO): 23 U/L — ABNORMAL LOW (ref 37–153)
Bilirubin, Direct: 0.1 mg/dL (ref 0.0–0.2)
Globulin: 2.7 g/dL (calc) (ref 1.9–3.7)
Indirect Bilirubin: 0.3 mg/dL (calc) (ref 0.2–1.2)
Total Bilirubin: 0.4 mg/dL (ref 0.2–1.2)
Total Protein: 6.2 g/dL (ref 6.1–8.1)

## 2021-02-03 LAB — MICROALBUMIN / CREATININE URINE RATIO
Creatinine, Urine: 109 mg/dL (ref 20–275)
Microalb Creat Ratio: 3 mcg/mg creat (ref <30)
Microalb, Ur: 0.3 mg/dL

## 2021-02-03 LAB — HEMOGLOBIN A1C
Hgb A1c MFr Bld: 5.4 % of total Hgb (ref <5.7)
Mean Plasma Glucose: 108 mg/dL
eAG (mmol/L): 6 mmol/L

## 2021-02-03 LAB — LIPID PANEL
Cholesterol: 157 mg/dL (ref <200)
HDL: 69 mg/dL (ref >=50)
LDL Cholesterol (Calc): 70 mg/dL (calc)
Non-HDL Cholesterol (Calc): 88 mg/dL (calc) (ref <130)
Total CHOL/HDL Ratio: 2.3 (calc) (ref <5.0)
Triglycerides: 97 mg/dL (ref <150)

## 2021-02-03 MED ORDER — ROSUVASTATIN CALCIUM 10 MG PO TABS
10.0000 mg | ORAL_TABLET | Freq: Every day | ORAL | 1 refills | Status: DC
  Filled 2021-02-03 – 2021-04-24 (×2): qty 90, 90d supply, fill #0

## 2021-02-03 MED ORDER — JANUVIA 100 MG PO TABS
100.0000 mg | ORAL_TABLET | Freq: Every day | ORAL | 1 refills | Status: DC
Start: 2021-02-03 — End: 2022-06-21
  Filled 2021-02-03 – 2021-12-22 (×4): qty 90, 90d supply, fill #0

## 2021-02-03 MED ORDER — PANTOPRAZOLE SODIUM 40 MG PO TBEC
40.0000 mg | DELAYED_RELEASE_TABLET | Freq: Every day | ORAL | 1 refills | Status: DC
  Filled 2021-02-03 – 2021-04-24 (×2): qty 90, 90d supply, fill #0

## 2021-02-03 MED ORDER — FENOFIBRATE 160 MG PO TABS
160.0000 mg | ORAL_TABLET | Freq: Every day | ORAL | 3 refills | Status: DC
  Filled 2021-02-03 – 2021-04-24 (×2): qty 90, 90d supply, fill #0
  Filled 2021-07-27: qty 90, 90d supply, fill #1
  Filled 2021-11-02: qty 90, 90d supply, fill #2

## 2021-02-03 MED ORDER — LORAZEPAM 2 MG PO TABS
2.0000 mg | ORAL_TABLET | Freq: Every day | ORAL | 1 refills | Status: DC
  Filled 2021-02-03 – 2021-07-27 (×2): qty 90, 90d supply, fill #0

## 2021-02-03 MED ORDER — PREDNISONE 10 MG (21) PO TBPK
ORAL_TABLET | ORAL | 0 refills | Status: DC
  Filled 2021-02-03: qty 21, 6d supply, fill #0

## 2021-02-03 MED ORDER — VENLAFAXINE HCL ER 75 MG PO CP24
75.0000 mg | ORAL_CAPSULE | Freq: Every day | ORAL | 3 refills | Status: DC
  Filled 2021-02-03 – 2021-04-24 (×2): qty 90, 90d supply, fill #0
  Filled 2021-08-10: qty 90, 90d supply, fill #1
  Filled 2021-11-02: qty 90, 90d supply, fill #2

## 2021-02-03 MED ORDER — CYCLOBENZAPRINE HCL 10 MG PO TABS
10.0000 mg | ORAL_TABLET | Freq: Three times a day (TID) | ORAL | 5 refills | Status: DC | PRN
  Filled 2021-02-03 – 2021-10-06 (×3): qty 30, 10d supply, fill #0
  Filled 2022-01-04: qty 30, 10d supply, fill #1
  Filled 2022-01-31: qty 30, 10d supply, fill #2

## 2021-02-03 MED ORDER — AMLODIPINE BESYLATE 5 MG PO TABS
5.0000 mg | ORAL_TABLET | Freq: Every day | ORAL | 1 refills | Status: DC
  Filled 2021-02-03 – 2021-04-24 (×2): qty 90, 90d supply, fill #0

## 2021-02-03 MED ORDER — TRAMADOL HCL 50 MG PO TABS
50.0000 mg | ORAL_TABLET | Freq: Three times a day (TID) | ORAL | 0 refills | Status: AC | PRN
  Filled 2021-02-03: qty 15, 5d supply, fill #0

## 2021-02-03 MED ORDER — METFORMIN HCL 500 MG PO TABS
500.0000 mg | ORAL_TABLET | Freq: Two times a day (BID) | ORAL | 1 refills | Status: DC
  Filled 2021-02-03 – 2021-04-24 (×2): qty 180, 90d supply, fill #0

## 2021-02-03 NOTE — Progress Notes (Signed)
   Subjective:    Patient ID: Jill Richardson, female    DOB: 10-09-1955, 65 y.o.   MRN: [id number]  HPI 65 year old Female for 6 month recheck. Has new issues with right shoulder pain. Denies injury. Decreased ROM and pain. Likely has adhesive capsulitis. Will try prednisone taper, exercises demonstrated, may need physical therapy.  Also given prescription for Flexeril to take at bedtime.  She has a history of essential hypertension, pure hypercholesterolemia, vitamin D deficiency and history of left breast cancer.  History of fibromyalgia syndrome and GE reflux.  History of supraventricular tachycardia.  History of insomnia and type 2 diabetes mellitus.  History of depression.  Recent lab studies showed hemoglobin A1c excellent at 5.4%.  She has very mild elevation of SGOT and SGPT at 37 and 34 respectively.  Lipid panel is normal.    Review of Systems not getting a lot of exercise     Objective:   Physical Exam Blood pressure excellent at 120/80 pulse 76 pulse oximetry 97% weight 160 pounds BMI 31.78.  Has  gained 3 pounds since February. Decreased range of motion right shoulder.  Cannot raise right arm up overhead due to extreme pain.  Muscle strength is normal in the right upper extremity.  Sensation is intact. Neck is supple without thyromegaly or carotid bruits.  Chest clear to auscultation.  Cardiac exam regular rate and rhythm without ectopy. No LE pitting edema.     Assessment & Plan:  Adhesive capsulitis right shoulder-patient will be placed on prednisone 10 mg starting with 6 tablets day 1 and decreasing by 10 mg daily. ROM exercises demonstrated. If not improving,may need PT. Tramadol given for pain  History of fibromyalgia syndrome  Type 2 diabetes mellitus with excellent hemoglobin A1c-currently on Januvia and metformin  Hyperlipidemia- normal lipid panel on statin and fenofibrate  Mild elevation of LFTs will recheck in 6 months monitor. ? Fatty liver. Needs diet and  exercise regimen.  Remote Hx of breast cancer  Essential hypertension stable on current regimen  History of insomnia  treated with Ativan  GE reflux treated with PPI  History of depression treated with Effexor  GE reflux treated with PPI  Plan: Let me know within a week or 2 if shoulder not improving. May need PT. Watch diet and get more exercise. RTC in 6 months for CPE. Have Covid booster this Fall.

## 2021-02-04 ENCOUNTER — Other Ambulatory Visit (HOSPITAL_COMMUNITY): Payer: Self-pay

## 2021-02-17 ENCOUNTER — Other Ambulatory Visit: Payer: Self-pay | Admitting: Internal Medicine

## 2021-02-17 MED ORDER — VALACYCLOVIR HCL 500 MG PO TABS
500.0000 mg | ORAL_TABLET | Freq: Every day | ORAL | 3 refills | Status: DC
  Filled 2021-02-17 – 2021-05-23 (×2): qty 90, 90d supply, fill #0
  Filled 2021-08-10: qty 90, 90d supply, fill #1
  Filled 2021-11-15: qty 90, 90d supply, fill #2

## 2021-02-17 MED FILL — Oxaprozin Tab 600 MG: ORAL | 90 days supply | Qty: 180 | Fill #1 | Status: AC

## 2021-02-18 ENCOUNTER — Other Ambulatory Visit (HOSPITAL_COMMUNITY): Payer: Self-pay

## 2021-02-19 ENCOUNTER — Other Ambulatory Visit (HOSPITAL_COMMUNITY): Payer: Self-pay

## 2021-02-19 ENCOUNTER — Telehealth: Payer: Self-pay

## 2021-02-19 NOTE — Telephone Encounter (Signed)
Patient called has tried tramadol for a week but her right shoulder is still hurting she said she was told if pain continued she would be referred to ortho.  [phone]

## 2021-02-19 NOTE — Telephone Encounter (Signed)
Called Susen back to let her know she can call ortho and get her own appointment, or she can go to one of the walk-in clinics. She wanted Dr Ninfa Linden phone number so I gave her that.

## 2021-03-08 NOTE — Patient Instructions (Signed)
Take prednisone in tapering course for adhesive capsulitis and do range of motion exercises.  Watch diet and try to get more exercise as liver functions are slightly elevated.  Continue current medications and follow-up in 6 months for health maintenance exam.  Have COVID booster this fall.

## 2021-03-23 ENCOUNTER — Ambulatory Visit (INDEPENDENT_AMBULATORY_CARE_PROVIDER_SITE_OTHER): Payer: PPO | Admitting: Orthopaedic Surgery

## 2021-03-23 ENCOUNTER — Ambulatory Visit: Payer: Self-pay

## 2021-03-23 ENCOUNTER — Other Ambulatory Visit: Payer: Self-pay

## 2021-03-23 DIAGNOSIS — M79601 Pain in right arm: Secondary | ICD-10-CM

## 2021-03-23 MED ORDER — METHYLPREDNISOLONE ACETATE 40 MG/ML IJ SUSP
40.0000 mg | INTRAMUSCULAR | Status: AC | PRN
  Administered 2021-03-23: 40 mg via INTRA_ARTICULAR

## 2021-03-23 MED ORDER — LIDOCAINE HCL 1 % IJ SOLN
3.0000 mL | INTRAMUSCULAR | Status: AC | PRN
  Administered 2021-03-23: 3 mL

## 2021-03-23 NOTE — Progress Notes (Signed)
Office Visit Note   Patient: Jill Richardson           Date of Birth: 12-26-1955           MRN: [id number] Visit Date: 03/23/2021              Requested by: Elby Showers, MD [address] [place],  [place] [address] PCP: Elby Showers, MD   Assessment & Plan: Visit Diagnoses:  1. Right arm pain     Plan: Her signs and symptoms seem to be related more to shoulder impingement syndrome.  I did recommend a steroid injection in her right shoulder subacromial outlet and explained the risk and benefits of injections to her.  She agreed to this and tolerated well.  She is deferring physical therapy for now because she think she can get her shoulder moving more so and I agree with this.  I showed her some stretching to try.  I would like to see her back in 4 weeks to see how she is done overall.  If her motion is not improving we may have her consider outpatient physical therapy.  All questions and concerns were answered and addressed.  Follow-Up Instructions: Return in about 4 weeks (around 04/20/2021).   Orders:  Orders Placed This Encounter  Procedures   Large Joint Inj   XR Shoulder Right   XR Cervical Spine 2 or 3 views   No orders of the defined types were placed in this encounter.     Procedures: Large Joint Inj: R subacromial bursa on 03/23/2021 9:15 AM Indications: pain and diagnostic evaluation Details: 22 G 1.5 in needle  Arthrogram: No  Medications: 3 mL lidocaine 1 %; 40 mg methylPREDNISolone acetate 40 MG/ML Outcome: tolerated well, no immediate complications Procedure, treatment alternatives, risks and benefits explained, specific risks discussed. Consent was given by the patient. Immediately prior to procedure a time out was called to verify the correct patient, procedure, equipment, support staff and site/side marked as required. Patient was prepped and draped in the usual sterile fashion.      Clinical Data: No additional  findings.   Subjective: Chief Complaint  Patient presents with   Right Shoulder - Pain  The patient comes in today for evaluation treatment of right shoulder pain for about 6 months now.  There is no neck pain but there is numbness and tingling goes down to about her elbow.  It does wake her up at night.  She does sleep on the right side.  She has had no acute change in her medical status.  There is been no head injury.  A steroid taper by her primary care physician did help.  Ibuprofen has not really helped.  She says the pain is getting worse.  She is not had any therapy.  Most of her pain is reaching overhead and reaching behind her.  She is a diabetic but the steroid taper did not bump up her blood glucose.  HPI  Review of Systems There is currently listed no headache, chest pain, shortness of breath, fever, chills, nausea, vomiting  Objective: Vital Signs: There were no vitals taken for this visit.  Physical Exam She is alert and orient x3 and in no acute distress Ortho Exam Examination of her right shoulder shows her range of motion is almost full.  Reaching behind her is only to the lower lumbar spine and she can reach higher on the right side.  There is minimal weakness in the rotator  cuff.  She has good pinch and grip strength.  Her neck exam just shows some pain but no worrisome findings. Specialty Comments:  No specialty comments available.  Imaging: XR Cervical Spine 2 or 3 views  Result Date: 03/23/2021 2 views of the cervical spine show degenerative changes with arthritic changes and disc base narrowing throughout the cervical spine.  XR Shoulder Right  Result Date: 03/23/2021 3 views of the right shoulder show well located shoulder with no acute findings.    PMFS History: Patient Active Problem List   Diagnosis Date Noted   Essential hypertension 01/26/2019   Menopausal syndrome 10/01/2015   Depression 08/22/2012   Insomnia 16/03/9603   Metabolic syndrome  54/02/8118   Type 2 diabetes mellitus ([facility]) 11/28/2011   Hyperlipidemia 12/23/2010   GE reflux 12/23/2010   Fibromyalgia 12/23/2010   Breast cancer of upper-outer quadrant of left female breast ([facility]) 12/23/2010   History of supraventricular tachycardia 12/23/2010   Past Medical History:  Diagnosis Date   Breast cancer ([facility]) 1478   Complication of anesthesia    Diabetes mellitus without complication (HCC)    Dysrhythmia    hx SVT   Fibromyalgia    GERD (gastroesophageal reflux disease)    Hyperlipidemia    Personal history of radiation therapy    left breast Ca   PONV (postoperative nausea and vomiting)    S/P lumbar fusion 2005   L5-S1   SVT (supraventricular tachycardia) ([facility])    Umbilical hernia     Family History  Problem Relation Age of Onset   Diabetes Sister    Hypertension Brother    Hypertension Mother    Diabetes Father    Hypertension Father        pulmonary HTN   Cancer Father    Kidney disease Father    Breast cancer Neg Hx     Past Surgical History:  Procedure Laterality Date   BREAST LUMPECTOMY Left 2008   malignant   INSERTION OF MESH N/A 03/21/2018   Procedure: INSERTION OF MESH;  Surgeon: Donnie Mesa, MD;  Location: [facility];  Service: General;  Laterality: N/A;  LMA   LUMBAR FUSION  2005   OVARY REMOVED  2956   UMBILICAL HERNIA REPAIR N/A 03/21/2018   Procedure: HERNIA REPAIR UMBILICAL ADULT WITH POSSIBLE MESH;  Surgeon: Donnie Mesa, MD;  Location: [facility];  Service: General;  Laterality: N/A;  LMA   Social History   Occupational History   Not on file  Tobacco Use   Smoking status: Never   Smokeless tobacco: Never  Substance and Sexual Activity   Alcohol use: No    Alcohol/week: 0.0 standard drinks   Drug use: No   Sexual activity: Not on file

## 2021-04-07 ENCOUNTER — Other Ambulatory Visit (HOSPITAL_COMMUNITY): Payer: Self-pay

## 2021-04-07 ENCOUNTER — Other Ambulatory Visit: Payer: Self-pay

## 2021-04-07 ENCOUNTER — Telehealth (INDEPENDENT_AMBULATORY_CARE_PROVIDER_SITE_OTHER): Payer: PPO | Admitting: Internal Medicine

## 2021-04-07 ENCOUNTER — Encounter: Payer: Self-pay | Admitting: Internal Medicine

## 2021-04-07 ENCOUNTER — Telehealth: Payer: Self-pay | Admitting: Internal Medicine

## 2021-04-07 VITALS — BP 138/88 | HR 84 | Temp 98.0°F | Resp 18

## 2021-04-07 DIAGNOSIS — U071 COVID-19: Secondary | ICD-10-CM

## 2021-04-07 DIAGNOSIS — M797 Fibromyalgia: Secondary | ICD-10-CM

## 2021-04-07 DIAGNOSIS — I10 Essential (primary) hypertension: Secondary | ICD-10-CM | POA: Diagnosis not present

## 2021-04-07 DIAGNOSIS — E785 Hyperlipidemia, unspecified: Secondary | ICD-10-CM

## 2021-04-07 DIAGNOSIS — E1169 Type 2 diabetes mellitus with other specified complication: Secondary | ICD-10-CM | POA: Diagnosis not present

## 2021-04-07 MED ORDER — HYDROCODONE BIT-HOMATROP MBR 5-1.5 MG/5ML PO SOLN
5.0000 mL | Freq: Three times a day (TID) | ORAL | 0 refills | Status: DC | PRN
  Filled 2021-04-07: qty 120, 8d supply, fill #0

## 2021-04-07 MED ORDER — NIRMATRELVIR&RITONAVIR 150/100 10 X 150 MG & 10 X 100MG PO TBPK
2.0000 | ORAL_TABLET | Freq: Two times a day (BID) | ORAL | 0 refills | Status: AC
  Filled 2021-04-07: qty 20, 5d supply, fill #0

## 2021-04-07 NOTE — Patient Instructions (Addendum)
Take Paxlovid as directed. Walk some to prevent atelectasis. Take Hycodan sparingly. Stay well hydrated. Monitor accuchecks.  Tylenol as needed for fever.

## 2021-04-07 NOTE — Progress Notes (Signed)
   Subjective:    Patient ID: Jill Richardson, female    DOB: Feb 16, 1956, 65 y.o.   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  Says her son works in the Emergency Department and he  came home last week with COVID symptoms and also tested positive for Covid-19.  Patient has malaise and fatigue.  No nausea or vomiting.  Has cough and congestion.  Has headache.  Due to the Coronavirus pandemic, patient is seen via interactive audio and video telecommunications.  She is identified using 2 identifiers as Jill Richardson. Laurance Flatten, a longstanding patient in this practice.  She is at her home and I am at my office.  She is agreeable to visit in this format today.     Review of Systems no nausea or vomiting.  Has malaise and generalized weakness.     Objective:   Physical Exam Vital signs are reviewed.  She is seen virtually and appears  pale.  She sounds hoarse and congested.  Not tachypneic.      Assessment & Plan:  Acute COVID-19 virus infection  History of Diabetes mellitus  Essential hypertension  Fibromyalgia syndrome  Plan: Paxlovid renal failure dose as patient has GFR 55 cc/min which is less than 60 cc/min recommended for regular dosing.  Spoke with pharmacist about this.  Hycodan 1 teaspoon every 6 hours as needed for cough  Tylenol if needed for fever.  Stay well-hydrated, walk some to prevent atelectasis.  Quarantine at home for 5 days.  Monitor Accu-Cheks.  Time spent on virtual visit is 15 minutes

## 2021-04-07 NOTE — Telephone Encounter (Signed)
Jill Richardson [phone]  [name] called to say she tested positive for COVID last night, she started getting sick yesterday with stuffy nose, sneezing cough, body aches, this morning she is loosing her voice, no fever, has had at least 3 vaccines. She can get blood pressure and tempeture for a video visit.

## 2021-04-07 NOTE — Telephone Encounter (Signed)
scheduled

## 2021-04-21 ENCOUNTER — Other Ambulatory Visit: Payer: Self-pay | Admitting: Internal Medicine

## 2021-04-21 ENCOUNTER — Other Ambulatory Visit (HOSPITAL_COMMUNITY): Payer: Self-pay

## 2021-04-21 MED ORDER — LOSARTAN POTASSIUM 100 MG PO TABS
100.0000 mg | ORAL_TABLET | Freq: Every day | ORAL | 3 refills | Status: DC
  Filled 2021-04-21: qty 90, 90d supply, fill #0
  Filled 2021-07-27 – 2021-07-30 (×2): qty 90, 90d supply, fill #1
  Filled 2021-11-02: qty 90, 90d supply, fill #2
  Filled 2022-01-31: qty 90, 90d supply, fill #3

## 2021-04-22 ENCOUNTER — Encounter: Payer: Self-pay | Admitting: Orthopaedic Surgery

## 2021-04-22 ENCOUNTER — Other Ambulatory Visit: Payer: Self-pay

## 2021-04-22 ENCOUNTER — Ambulatory Visit (INDEPENDENT_AMBULATORY_CARE_PROVIDER_SITE_OTHER): Payer: PPO | Admitting: Orthopaedic Surgery

## 2021-04-22 DIAGNOSIS — M25511 Pain in right shoulder: Secondary | ICD-10-CM | POA: Diagnosis not present

## 2021-04-22 DIAGNOSIS — M79601 Pain in right arm: Secondary | ICD-10-CM

## 2021-04-22 NOTE — Progress Notes (Signed)
The patient comes today for follow-up after having a subacromial steroid injection in the right shoulder.  She says she is much better overall and injection was very helpful.  Her right shoulder moves smoothly and fluidly and does not seem to have any deficits to the rotator cuff or any other issues.  She has been doing her home exercises as well.  At this point since she is doing so well follow-up can be as needed.  If her shoulder flares up again we can always repeat injection before I consider a MRI or even therapy depending on how she is doing.  All question concerns were answered and addressed.

## 2021-04-24 ENCOUNTER — Other Ambulatory Visit (HOSPITAL_COMMUNITY): Payer: Self-pay

## 2021-04-27 ENCOUNTER — Other Ambulatory Visit (HOSPITAL_COMMUNITY): Payer: Self-pay

## 2021-05-23 ENCOUNTER — Other Ambulatory Visit: Payer: Self-pay | Admitting: Internal Medicine

## 2021-05-23 ENCOUNTER — Other Ambulatory Visit (HOSPITAL_COMMUNITY): Payer: Self-pay

## 2021-05-23 MED ORDER — OXAPROZIN 600 MG PO TABS
1200.0000 mg | ORAL_TABLET | Freq: Every day | ORAL | 2 refills | Status: DC
  Filled 2021-05-23: qty 180, 90d supply, fill #0
  Filled 2021-08-25: qty 180, 90d supply, fill #1
  Filled 2021-08-29: qty 180, 90d supply, fill #0

## 2021-05-25 ENCOUNTER — Other Ambulatory Visit (HOSPITAL_COMMUNITY): Payer: Self-pay

## 2021-05-26 ENCOUNTER — Other Ambulatory Visit (HOSPITAL_COMMUNITY): Payer: Self-pay

## 2021-05-26 ENCOUNTER — Telehealth: Payer: Self-pay | Admitting: Internal Medicine

## 2021-05-26 DIAGNOSIS — Z0289 Encounter for other administrative examinations: Secondary | ICD-10-CM

## 2021-05-26 NOTE — Telephone Encounter (Signed)
Completed form for [employer]- and mailed  [employer] [place] [address]

## 2021-05-28 ENCOUNTER — Other Ambulatory Visit (HOSPITAL_COMMUNITY): Payer: Self-pay

## 2021-05-29 ENCOUNTER — Other Ambulatory Visit (HOSPITAL_COMMUNITY): Payer: Self-pay

## 2021-07-09 ENCOUNTER — Other Ambulatory Visit: Payer: Self-pay | Admitting: Internal Medicine

## 2021-07-09 DIAGNOSIS — Z1231 Encounter for screening mammogram for malignant neoplasm of breast: Secondary | ICD-10-CM

## 2021-07-27 ENCOUNTER — Other Ambulatory Visit: Payer: Self-pay | Admitting: Internal Medicine

## 2021-07-27 ENCOUNTER — Other Ambulatory Visit (HOSPITAL_COMMUNITY): Payer: Self-pay

## 2021-07-27 MED ORDER — METFORMIN HCL 500 MG PO TABS
500.0000 mg | ORAL_TABLET | Freq: Two times a day (BID) | ORAL | 3 refills | Status: DC
  Filled 2021-07-27: qty 180, 90d supply, fill #0
  Filled 2021-11-02: qty 180, 90d supply, fill #1
  Filled 2022-01-31: qty 180, 90d supply, fill #2
  Filled 2022-04-27: qty 180, 90d supply, fill #3

## 2021-07-27 MED ORDER — PANTOPRAZOLE SODIUM 40 MG PO TBEC
40.0000 mg | DELAYED_RELEASE_TABLET | Freq: Every day | ORAL | 3 refills | Status: DC
  Filled 2021-07-27: qty 90, 90d supply, fill #0
  Filled 2021-11-02: qty 90, 90d supply, fill #1
  Filled 2022-01-31: qty 90, 90d supply, fill #2
  Filled 2022-04-27: qty 90, 90d supply, fill #3

## 2021-07-30 ENCOUNTER — Other Ambulatory Visit (HOSPITAL_COMMUNITY): Payer: Self-pay

## 2021-08-04 ENCOUNTER — Other Ambulatory Visit: Payer: Self-pay

## 2021-08-04 ENCOUNTER — Other Ambulatory Visit: Payer: PPO | Admitting: Internal Medicine

## 2021-08-04 DIAGNOSIS — E1169 Type 2 diabetes mellitus with other specified complication: Secondary | ICD-10-CM

## 2021-08-04 DIAGNOSIS — I10 Essential (primary) hypertension: Secondary | ICD-10-CM

## 2021-08-04 DIAGNOSIS — Z1329 Encounter for screening for other suspected endocrine disorder: Secondary | ICD-10-CM | POA: Diagnosis not present

## 2021-08-04 DIAGNOSIS — E785 Hyperlipidemia, unspecified: Secondary | ICD-10-CM

## 2021-08-05 LAB — HEMOGLOBIN A1C
Hgb A1c MFr Bld: 5.1 % of total Hgb (ref <5.7)
Mean Plasma Glucose: 100 mg/dL
eAG (mmol/L): 5.5 mmol/L

## 2021-08-05 LAB — COMPLETE METABOLIC PANEL WITH GFR
AG Ratio: 1.3 (calc) (ref 1.0–2.5)
ALT: 24 U/L (ref 6–29)
AST: 31 U/L (ref 10–35)
Albumin: 3.5 g/dL — ABNORMAL LOW (ref 3.6–5.1)
Alkaline phosphatase (APISO): 26 U/L — ABNORMAL LOW (ref 37–153)
BUN/Creatinine Ratio: 22 (calc) (ref 6–22)
BUN: 34 mg/dL — ABNORMAL HIGH (ref 7–25)
CO2: 25 mmol/L (ref 20–32)
Calcium: 9.2 mg/dL (ref 8.6–10.4)
Chloride: 106 mmol/L (ref 98–110)
Creat: 1.55 mg/dL — ABNORMAL HIGH (ref 0.50–1.05)
Globulin: 2.7 g/dL (calc) (ref 1.9–3.7)
Glucose, Bld: 76 mg/dL (ref 65–99)
Potassium: 4.2 mmol/L (ref 3.5–5.3)
Sodium: 142 mmol/L (ref 135–146)
Total Bilirubin: 0.4 mg/dL (ref 0.2–1.2)
Total Protein: 6.2 g/dL (ref 6.1–8.1)
eGFR: 36 mL/min/{1.73_m2} — ABNORMAL LOW (ref >=60)

## 2021-08-05 LAB — CBC WITH DIFFERENTIAL/PLATELET
Absolute Monocytes: 300 cells/uL (ref 200–950)
Basophils Absolute: 42 cells/uL (ref 0–200)
Basophils Relative: 1.1 %
Eosinophils Absolute: 122 cells/uL (ref 15–500)
Eosinophils Relative: 3.2 %
HCT: 32 % — ABNORMAL LOW (ref 35.0–45.0)
Hemoglobin: 10.7 g/dL — ABNORMAL LOW (ref 11.7–15.5)
Lymphs Abs: 1197 cells/uL (ref 850–3900)
MCH: 32.3 pg (ref 27.0–33.0)
MCHC: 33.4 g/dL (ref 32.0–36.0)
MCV: 96.7 fL (ref 80.0–100.0)
MPV: 9.7 fL (ref 7.5–12.5)
Monocytes Relative: 7.9 %
Neutro Abs: 2139 cells/uL (ref 1500–7800)
Neutrophils Relative %: 56.3 %
Platelets: 317 10*3/uL (ref 140–400)
RBC: 3.31 10*6/uL — ABNORMAL LOW (ref 3.80–5.10)
RDW: 13.6 % (ref 11.0–15.0)
Total Lymphocyte: 31.5 %
WBC: 3.8 10*3/uL (ref 3.8–10.8)

## 2021-08-05 LAB — LIPID PANEL
Cholesterol: 161 mg/dL (ref <200)
HDL: 64 mg/dL (ref >=50)
LDL Cholesterol (Calc): 77 mg/dL (calc)
Non-HDL Cholesterol (Calc): 97 mg/dL (calc) (ref <130)
Total CHOL/HDL Ratio: 2.5 (calc) (ref <5.0)
Triglycerides: 121 mg/dL (ref <150)

## 2021-08-05 LAB — TSH: TSH: 2.35 mIU/L (ref 0.40–4.50)

## 2021-08-06 ENCOUNTER — Other Ambulatory Visit: Payer: Self-pay

## 2021-08-06 ENCOUNTER — Encounter: Payer: Self-pay | Admitting: Internal Medicine

## 2021-08-06 ENCOUNTER — Ambulatory Visit (INDEPENDENT_AMBULATORY_CARE_PROVIDER_SITE_OTHER): Payer: PPO | Admitting: Internal Medicine

## 2021-08-06 VITALS — BP 138/84 | HR 81 | Temp 97.9°F | Ht 60.25 in | Wt 162.0 lb

## 2021-08-06 DIAGNOSIS — Z853 Personal history of malignant neoplasm of breast: Secondary | ICD-10-CM

## 2021-08-06 DIAGNOSIS — G8929 Other chronic pain: Secondary | ICD-10-CM

## 2021-08-06 DIAGNOSIS — M797 Fibromyalgia: Secondary | ICD-10-CM

## 2021-08-06 DIAGNOSIS — F5101 Primary insomnia: Secondary | ICD-10-CM

## 2021-08-06 DIAGNOSIS — D649 Anemia, unspecified: Secondary | ICD-10-CM

## 2021-08-06 DIAGNOSIS — E1169 Type 2 diabetes mellitus with other specified complication: Secondary | ICD-10-CM | POA: Diagnosis not present

## 2021-08-06 DIAGNOSIS — M7918 Myalgia, other site: Secondary | ICD-10-CM

## 2021-08-06 DIAGNOSIS — F439 Reaction to severe stress, unspecified: Secondary | ICD-10-CM

## 2021-08-06 DIAGNOSIS — Z0000 Encounter for general adult medical examination without abnormal findings: Secondary | ICD-10-CM

## 2021-08-06 DIAGNOSIS — I10 Essential (primary) hypertension: Secondary | ICD-10-CM | POA: Diagnosis not present

## 2021-08-06 DIAGNOSIS — E785 Hyperlipidemia, unspecified: Secondary | ICD-10-CM | POA: Diagnosis not present

## 2021-08-06 LAB — POCT URINALYSIS DIPSTICK
Bilirubin, UA: NEGATIVE
Blood, UA: NEGATIVE
Glucose, UA: NEGATIVE
Ketones, UA: NEGATIVE
Leukocytes, UA: NEGATIVE
Nitrite, UA: NEGATIVE
Protein, UA: NEGATIVE
Spec Grav, UA: 1.01 (ref 1.010–1.025)
Urobilinogen, UA: 0.2 E.U./dL
pH, UA: 7.5 (ref 5.0–8.0)

## 2021-08-06 NOTE — Progress Notes (Signed)
° ° ° °  Annual Wellness Visit     Patient: Jill Richardson, Female    DOB: 1955-07-18, 66 y.o.   MRN: [id number] Visit Date: 08/06/2021  Chief Complaint  Patient presents with   Medicare Wellness   Subjective    Jill Richardson is a 66 y.o. female who presents today for her Annual Wellness Visit.  HPI  Repeat colonoscopy due in 2028. Labs show anemia and elevated creatinine. Takes NSAIDS daily for musculoskeletal pain and has done so for years.   Social History   Social History Narrative   Social history: She is married and has grandchildren.  Husband is disabled due to a chronic brain injury after an accident in the mid 52s.  2 adult sons.       Family history: Father with history of diabetes and hypertension died from complications of heart failure.  Mother still living.  Brother with history of hypertension.  Sister in good health.    Patient Care Team: Elby Showers, MD as PCP - General (Internal Medicine) Gus Height, MD (Inactive) (Obstetrics and Gynecology) Eston Esters, MD (Inactive) (Hematology and Oncology) Tyler Pita, MD (Radiation Oncology) Neldon Mc, MD (General Surgery)  Review of Systems   Objective    Vitals: Ht 5' 0.25" (1.53 m)    Wt 162 lb (73.5 kg)    BMI 31.38 kg/m   Physical Exam   Most recent functional status assessment: In your present state of health, do you have any difficulty performing the following activities: 08/06/2021  Hearing? N  Vision? N  Difficulty concentrating or making decisions? N  Walking or climbing stairs? N  Doing errands, shopping? N  Preparing Food and eating ? N  In the past six months, have you accidently leaked urine? N  Do you have problems with loss of bowel control? N  Managing your Medications? N  Managing your Finances? N  Housekeeping or managing your Housekeeping? N  Some recent data might be hidden   Most recent fall risk assessment: Fall Risk  08/06/2021  Falls in the past year? 0  Number  falls in past yr: 0  Injury with Fall? 0  Risk for fall due to : No Fall Risks  Follow up Falls evaluation completed    Most recent depression screenings: PHQ 2/9 Scores 08/06/2021 08/05/2020  PHQ - 2 Score 0 0  PHQ- 9 Score - -   Most recent cognitive screening: 6CIT Screen 08/06/2021  What Year? 0 points  What month? 0 points  What time? 0 points  Count back from 20 0 points  Months in reverse 0 points  Repeat phrase 0 points  Total Score 0       Assessment & Plan     Annual wellness visit done today including the all of the following: Reviewed patient's Family Medical History Reviewed and updated list of patient's medical providers Assessment of cognitive impairment was done Assessed patient's functional ability Established a written schedule for health screening [place] Completed and Reviewed  Discussed health benefits of physical activity, and encouraged her to engage in regular exercise appropriate for her age and condition.            Angus Seller, CMA

## 2021-08-06 NOTE — Patient Instructions (Addendum)
Change Januvia to one half tablet daily and recheck HGB AIC in 6 months. ° °Be well hydrated and repeat B-met and 1-[redacted] weeks along with anemia studies then too. °

## 2021-08-06 NOTE — Progress Notes (Signed)
Annual Wellness Visit     Patient: Jill Richardson, Female    DOB: 11/09/1955, 66 y.o.   MRN: [id number] Visit Date: 08/06/2021  Chief Complaint  Patient presents with   [facility] Wellness   Annual Exam   Subjective    DOTTI Richardson is a 66 y.o. female who presents today for her Annual Wellness Visit.  HPI She is also here for health maintenance exam and evaluation of medical issues.Husband has been dx with kidney cancer and is being seen by Dr. Alen Blew. 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 Is only eating 2 meals a day. Patient has developed a normocytic anemia and needs anemia studies. Has also developed elevated serum creatinine of 1.55.  She was fasting at the time.  May have some chronic kidney disease.  Albumin is low at 3.5.  Has not been eating a great deal.  Liver functions are normal with the exception of low alkaline phosphatase of 26 which she always has.  TSH is normal.  Cholesterol and triglycerides are normal.  History of right cataract extraction.  History of fibromyalgia syndrome dating back to the 1990s.  History of dysplastic nevus of the shoulder in 2008.  Left oophorectomy March 1997.  Exploratory laparotomy for small bowel obstruction 1 month later.  History of chronic low back pain with lumbar fusion L5-S1 in 2005 with cages and pedicle screws by Dr. Carloyn Manner.  History of type 2 diabetes mellitus treated with Januvia.  Remote history of breast cancer with no recurrence.  In September 1443 had a periumbilical hernia repair by Dr. Georgette Dover.  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  She has had 2 pregnancies and no miscarriages.  Menarche at age 56.  Menopause around age 62.  She took estrogen replacement until her cancer diagnosis.  Tumor was ER/PR positive with evidence of lymphovascular invasion.  Nodes were negative.  She had radiation and lumpectomy.  Was treated with tamoxifen and Femara and has done well.  Had  colonoscopy in 2008 by Dr. Cristina Gong with an inflammatory polyp removed.  This was not adenomatous.  Had another colonoscopy in 2018 with another benign polyp removed.  Follow-up was recommended in 10 years.  Takes Crestor for hyperlipidemia and PPI for GE reflux.  She is on metformin and Januvia for diabetes with good control.  Takes Flexeril for back pain in addition to occasionally tramadol or Norco if back pain is severe.  Social history: She is married and has grandchildren.  Husband is disabled due to chronic injury after an accident in the mid 16s.  2 adult sons.  Family history: Father with history of diabetes and hypertension died from complications of heart failure.  Mother still living.  Brother with history of hypertension.  Sister in good health.   Review of systems: Fatigue and situational stress     Social History   Social History Narrative   Social history: She is married and has grandchildren.  Husband is disabled due to a chronic brain injury after an accident in the mid 10s.  2 adult sons.       Family history: Father with history of diabetes and hypertension died from complications of heart failure.  Mother still living.  Brother with history of hypertension.  Sister in good health.    Patient Care Team: Elby Showers, MD as PCP - General (Internal Medicine) Gus Height, MD (Inactive) (Obstetrics and Gynecology) Eston Esters, MD (Inactive) (Hematology and Oncology) Tammi Klippel,  Rodman Key, MD (Radiation Oncology) Neldon Mc, MD (General Surgery)  Review of Systems fatigue and situational stress   Objective    Vitals: BP 138/84    Pulse 81    Temp 97.9 F (36.6 C) (Tympanic)    Ht 5' 0.25" (1.53 m)    Wt 162 lb (73.5 kg)    SpO2 99%    BMI 31.38 kg/m   Physical Exam: Skin: Warm and dry.  No cervical adenopathy.  TMs clear.  Neck is supple.  No thyromegaly.  Chest clear.  Cardiac exam: Regular rate and rhythm.  Abdomen soft nondistended without  hepatosplenomegaly masses or tenderness.  Breast without masses.  No lower extremity pitting edema.  Brief neurological exam intact without deficits.  Affect thought and judgment are normal.   Most recent functional status assessment: In your present state of health, do you have any difficulty performing the following activities: 08/06/2021  Hearing? N  Vision? N  Difficulty concentrating or making decisions? N  Walking or climbing stairs? N  Doing errands, shopping? N  Preparing Food and eating ? N  In the past six months, have you accidently leaked urine? N  Do you have problems with loss of bowel control? N  Managing your Medications? N  Managing your Finances? N  Housekeeping or managing your Housekeeping? N  Some recent data might be hidden   Most recent fall risk assessment: Fall Risk  08/06/2021  Falls in the past year? 0  Number falls in past yr: 0  Injury with Fall? 0  Risk for fall due to : No Fall Risks  Follow up Falls evaluation completed    Most recent depression screenings: PHQ 2/9 Scores 08/06/2021 08/05/2020  PHQ - 2 Score 0 0  PHQ- 9 Score - -   Most recent cognitive screening: 6CIT Screen 08/06/2021  What Year? 0 points  What month? 0 points  What time? 0 points  Count back from 20 0 points  Months in reverse 0 points  Repeat phrase 0 points  Total Score 0       Assessment & Plan  Hemoglobin is 10.7 g.  Does not have symptoms of GI bleeding.  Needs to consider repeat colonoscopy.  Do not have good reason for this.  Her anemia studies show a low retic count 1.5%.  Her iron level is within normal limits at 88.  B12 and folate were within normal limits.  She will return to discuss this in late February.  She is on chronic NSAID therapy.  History of breast cancer with no recurrence  History of back pain status post L5-S1 fusion in 2005  Insomnia treated with chronic sleep medication  History of depression stable with SSRI  History of dysplastic nevus  2008  GE reflux treated with PPI  Type 2 diabetes mellitus-stable  Elevated serum creatinine.  Consider chronic kidney disease with chronic NSAID therapy and essential hypertension.  May need nephrology consultation.  Mixed hyperlipidemia treated with fenofibrate and Crestor  Type 2 diabetes mellitus stable  Essential hypertension-stable  Plan: Anemia studies to be drawn and we will follow-up in the late February.  She will return well-hydrated and repeat basic metabolic panel as well.  Follow-up late February.  Change Januvia to 1/2 tablet daily and recheck hemoglobin A1c in 6 months.     Annual wellness visit done today including the all of the following: Reviewed patient's Family Medical History Reviewed and updated list of patient's medical providers Assessment of cognitive impairment was done  Assessed patient's functional ability Established a written schedule for health screening services Health Risk Assessent Completed and Reviewed  Discussed health benefits of physical activity, and encouraged her to engage in regular exercise appropriate for her age and condition.        IElby Showers, MD, have reviewed all documentation for this visit. The documentation on 08/06/21 for the exam, diagnosis, procedures, and orders are all accurate and complete. IElby Showers, MD, have reviewed all documentation for this visit. The documentation on 08/23/21 for the exam, diagnosis, procedures, and orders are all accurate and complete.   Elby Showers, MD

## 2021-08-06 NOTE — Progress Notes (Deleted)
IElby Showers, MD, have reviewed all documentation for this visit. The documentation on 08/06/21 for the exam, diagnosis, procedures, and orders are all accurate and complete.

## 2021-08-10 ENCOUNTER — Other Ambulatory Visit: Payer: Self-pay | Admitting: Internal Medicine

## 2021-08-10 ENCOUNTER — Other Ambulatory Visit (HOSPITAL_COMMUNITY): Payer: Self-pay

## 2021-08-10 MED ORDER — ROSUVASTATIN CALCIUM 10 MG PO TABS
10.0000 mg | ORAL_TABLET | Freq: Every day | ORAL | 3 refills | Status: DC
  Filled 2021-08-10: qty 90, 90d supply, fill #0
  Filled 2021-11-15: qty 90, 90d supply, fill #1
  Filled 2022-03-01: qty 90, 90d supply, fill #2
  Filled 2022-05-21: qty 90, 90d supply, fill #3

## 2021-08-10 MED ORDER — AMLODIPINE BESYLATE 5 MG PO TABS
5.0000 mg | ORAL_TABLET | Freq: Every day | ORAL | 3 refills | Status: DC
  Filled 2021-08-10: qty 90, 90d supply, fill #0
  Filled 2021-11-23: qty 90, 90d supply, fill #1
  Filled 2022-03-01: qty 90, 90d supply, fill #2
  Filled 2022-05-21: qty 90, 90d supply, fill #3

## 2021-08-17 ENCOUNTER — Ambulatory Visit
Admission: RE | Admit: 2021-08-17 | Discharge: 2021-08-17 | Disposition: A | Discharge status: Home / Self Care | Payer: PPO | Source: Ambulatory Visit | Attending: Internal Medicine | Admitting: Internal Medicine

## 2021-08-17 ENCOUNTER — Other Ambulatory Visit: Payer: Self-pay

## 2021-08-17 DIAGNOSIS — Z1231 Encounter for screening mammogram for malignant neoplasm of breast: Secondary | ICD-10-CM | POA: Diagnosis not present

## 2021-08-18 ENCOUNTER — Other Ambulatory Visit: Payer: PPO | Admitting: Internal Medicine

## 2021-08-18 DIAGNOSIS — R7989 Other specified abnormal findings of blood chemistry: Secondary | ICD-10-CM | POA: Diagnosis not present

## 2021-08-18 DIAGNOSIS — D649 Anemia, unspecified: Secondary | ICD-10-CM

## 2021-08-18 DIAGNOSIS — R5383 Other fatigue: Secondary | ICD-10-CM | POA: Diagnosis not present

## 2021-08-18 LAB — BASIC METABOLIC PANEL
BUN/Creatinine Ratio: 21 (calc) (ref 6–22)
BUN: 31 mg/dL — ABNORMAL HIGH (ref 7–25)
CO2: 26 mmol/L (ref 20–32)
Calcium: 9.1 mg/dL (ref 8.6–10.4)
Chloride: 104 mmol/L (ref 98–110)
Creat: 1.46 mg/dL — ABNORMAL HIGH (ref 0.50–1.05)
Glucose, Bld: 103 mg/dL — ABNORMAL HIGH (ref 65–99)
Potassium: 4.3 mmol/L (ref 3.5–5.3)
Sodium: 140 mmol/L (ref 135–146)

## 2021-08-18 LAB — IRON, TOTAL/TOTAL IRON BINDING CAP
%SAT: 17 % (calc) (ref 16–45)
Iron: 88 ug/dL (ref 45–160)
TIBC: 513 mcg/dL (calc) — ABNORMAL HIGH (ref 250–450)

## 2021-08-18 LAB — B12 AND FOLATE PANEL
Folate: >24 ng/mL
Vitamin B-12: 540 pg/mL (ref 200–1100)

## 2021-08-18 LAB — RETICULOCYTES
ABS Retic: 46650 cells/uL (ref 20000–80000)
Retic Ct Pct: 1.5 %

## 2021-08-18 NOTE — Addendum Note (Signed)
Addended by: Angus Seller on: 08/18/2021 10:43 AM   Modules accepted: Orders

## 2021-08-20 NOTE — Progress Notes (Signed)
Appt made

## 2021-08-25 ENCOUNTER — Encounter: Payer: Self-pay | Admitting: Internal Medicine

## 2021-08-25 ENCOUNTER — Other Ambulatory Visit: Payer: Self-pay

## 2021-08-25 ENCOUNTER — Ambulatory Visit (INDEPENDENT_AMBULATORY_CARE_PROVIDER_SITE_OTHER): Payer: PPO | Admitting: Internal Medicine

## 2021-08-25 ENCOUNTER — Other Ambulatory Visit (HOSPITAL_COMMUNITY): Payer: Self-pay

## 2021-08-25 VITALS — BP 130/80 | HR 94 | Temp 98.2°F | Ht 60.03 in | Wt 161.8 lb

## 2021-08-25 DIAGNOSIS — D649 Anemia, unspecified: Secondary | ICD-10-CM | POA: Diagnosis not present

## 2021-08-25 NOTE — Progress Notes (Signed)
Subjective:    Patient ID: Jill Richardson, female    DOB: 12/17/1955, 66 y.o.   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 Does not think it is too much.  She was seen here for Medicare annual wellness visit and health maintenance exam on February night and had some abnormal findings on her lab work.  She is here today at my request to discuss these findings.  Her hemoglobin A1c was actually normal.  However she was found to have a normocytic anemia.  B12 and folate levels were normal.  Retake count was only 1.5%.  Iron level was 88 and percent saturation was 17%.  Total iron binding capacity was elevated at 513.  In addition to these abnormalities her creatinine was 1.46 and 1.552 weeks apart.  Previously in February 2022 creatinine had been 1.06 and in 2021 creatinine had been 1.10.  It looks like she is developing chronic kidney disease.  She does have a history of fibromyalgia syndrome and has taken Daypro for a long time.  She also has hypertension treated with Cozaar 100 mg daily and amlodipine 5 mg daily.  She has type 2 diabetes treated with Januvia.  She has hyperlipidemia mixed and takes fenofibrate and rosuvastatin.  She had a colonoscopy in 2008 by Dr. Cristina Richardson with an inflammatory polyp removed.  This was not adenomatous.  She had another colonoscopy in 2018 with another benign polyp removed.  Follow-up was recommended in 10 years but in view of iron deficiency, she probably should have another colonoscopy sooner.  History of chronic low back pain with lumbar fusion L5-S1 in 2005 with cages and pedicle screws by Dr. Carloyn Richardson.  Longstanding history of fibromyalgia syndrome.  Longstanding use of Daypro (NSAID).  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  Became menopausal around age 58.  Tumor was ER/PR positive with evidence of lymphovascular invasion.  Nodes were negative.  She had radiation and lumpectomy and  was treated with tamoxifen and then Femara.  History of type 2 diabetes mellitus, hyperlipidemia, GE reflux, history of SVT.  History of dysplastic nevus of the shoulder in 2008.  Left oophorectomy in March 1997 and exploratory laparotomy for small bowel obstruction 1 month later.  She is allergic to Lodine.  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  Social history: She is married and has grandchildren.  Husband is disabled due to chronic brain injury after a tree fell on him in the mid 65s.  2 adult sons.  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   Review of Systems she does not complain of weakness or significant fatigue.  She has a remote history of breast cancer.     Objective:   Physical Exam Blood pressure 130/80 pulse 94 temperature 98.2 degrees pulse oximetry 99% weight 161 pounds 12 ounces  She was not examined today but is here for discussion of lab abnormalities.  She has reviewed these.  B12 and folate levels are within normal limits.  Reticulocyte count is low at 1.5%.  Iron level is 88 and percent saturation is 17%.  Total iron binding capacity elevated 513.  Hemoglobin on February 7 was low at 10.7 with a MCV of 96.7.  Basic metabolic panel fasting showed creatinine of 1.55 on February 7 and previously was 1.06 in 2022.  Had her well hydrate  herself and repeated be met and it was 1.46 on February 21       Assessment & Plan:  Normocytic anemia  Low reticulocyte count count  Chronic kidney disease-stage IIIa could be from chronic NSAID use for fibromyalgia  Plan: Patient is being referred to Hematology for evaluation.  I recommended repeat colonoscopy although she had colonoscopy in 2018 by Dr. Cristina Richardson.  Also she is being referred to [facility] for elevated serum creatinine.  Questions answered and she understands the medical issues quite well as she is a Equities trader.

## 2021-08-25 NOTE — Patient Instructions (Signed)
We are referring you to hematology for normocytic anemia and to [facility] for elevated serum creatinine.  Please call [facility] to see if Gastroenterologist would like to perform repeat colonoscopy given normocytic anemia.

## 2021-08-26 ENCOUNTER — Telehealth: Payer: Self-pay | Admitting: Oncology

## 2021-08-26 ENCOUNTER — Other Ambulatory Visit (HOSPITAL_COMMUNITY): Payer: Self-pay

## 2021-08-26 NOTE — Telephone Encounter (Signed)
Scheduled appt per 2/28 referral. Pt is aware of appt date and time. Pt is aware to arrive 15 mins prior to appt time and to bring and updated insurance card. Pt is aware of appt location.   °

## 2021-08-29 ENCOUNTER — Other Ambulatory Visit (HOSPITAL_COMMUNITY): Payer: Self-pay

## 2021-08-31 ENCOUNTER — Other Ambulatory Visit (HOSPITAL_COMMUNITY): Payer: Self-pay

## 2021-09-01 ENCOUNTER — Other Ambulatory Visit (HOSPITAL_COMMUNITY): Payer: Self-pay

## 2021-09-02 ENCOUNTER — Ambulatory Visit (INDEPENDENT_AMBULATORY_CARE_PROVIDER_SITE_OTHER): Payer: PPO

## 2021-09-02 ENCOUNTER — Encounter: Payer: Self-pay | Admitting: Orthopaedic Surgery

## 2021-09-02 ENCOUNTER — Ambulatory Visit (INDEPENDENT_AMBULATORY_CARE_PROVIDER_SITE_OTHER): Payer: PPO | Admitting: Orthopaedic Surgery

## 2021-09-02 DIAGNOSIS — M25551 Pain in right hip: Secondary | ICD-10-CM | POA: Diagnosis not present

## 2021-09-02 DIAGNOSIS — M25511 Pain in right shoulder: Secondary | ICD-10-CM | POA: Diagnosis not present

## 2021-09-02 DIAGNOSIS — G8929 Other chronic pain: Secondary | ICD-10-CM

## 2021-09-02 MED ORDER — METHYLPREDNISOLONE ACETATE 40 MG/ML IJ SUSP
40.0000 mg | INTRAMUSCULAR | Status: AC | PRN
  Administered 2021-09-02: 40 mg via INTRA_ARTICULAR

## 2021-09-02 MED ORDER — LIDOCAINE HCL 1 % IJ SOLN
3.0000 mL | INTRAMUSCULAR | Status: AC | PRN
  Administered 2021-09-02: 3 mL

## 2021-09-02 NOTE — Progress Notes (Signed)
? ? ? ?Office Visit Note ?  ?Patient: Jill Richardson           ?Date of Birth: Nov 13, 1955           ?MRN: [id number] ?Visit Date: 09/02/2021 ?             ?Requested by: Elby Showers, MD ?[id number] ?[place],  [place] [id number] ?PCP: Elby Showers, MD ? ? ?Assessment & Plan: ?Visit Diagnoses:  ?1. Pain in right hip   ?2. Chronic right shoulder pain   ? ? ?Plan: Her right hip signs and symptoms are more consistent with trochanteric bursitis.  I recommend a steroid injection of this area which she tolerated well.  Also recommend a steroid injection of her right shoulder subacromial outlet.  We did half the dose given her diabetes but this is under excellent control so she will watch her blood glucose closely.  All questions and concerns were answered addressed.  She can try Voltaren gel in both these areas.  I did show her stretching exercises for the right hip.  My next step would be for her to consider physical therapy on the right shoulder and the right hip if things are not getting better.  We may end up needing to MRI of the right shoulder. ? ?Follow-Up Instructions: Return if symptoms worsen or fail to improve.  ? ?Orders:  ?Orders Placed This Encounter  ?Procedures  ? Large Joint Inj  ? Large Joint Inj  ? XR HIP UNILAT W OR W/O PELVIS 1V RIGHT  ? ?No orders of the defined types were placed in this encounter. ? ? ? ? Procedures: ?Large Joint Inj: R subacromial bursa on 09/02/2021 9:31 AM ?Indications: pain and diagnostic evaluation ?Details: 22 G 1.5 in needle ? ?Arthrogram: No ? ?Medications: 3 mL lidocaine 1 %; 40 mg methylPREDNISolone acetate 40 MG/ML ?Outcome: tolerated well, no immediate complications ?Procedure, treatment alternatives, risks and benefits explained, specific risks discussed. Consent was given by the patient. Immediately prior to procedure a time out was called to verify the correct patient, procedure, equipment, support staff and site/side marked as required. Patient was prepped and  draped in the usual sterile fashion.  ? ? ?Large Joint Inj: R greater trochanter on 09/02/2021 9:32 AM ?Indications: pain and diagnostic evaluation ?Details: 22 G 1.5 in needle, lateral approach ? ?Arthrogram: No ? ?Medications: 3 mL lidocaine 1 %; 40 mg methylPREDNISolone acetate 40 MG/ML ?Outcome: tolerated well, no immediate complications ?Procedure, treatment alternatives, risks and benefits explained, specific risks discussed. Consent was given by the patient. Immediately prior to procedure a time out was called to verify the correct patient, procedure, equipment, support staff and site/side marked as required. Patient was prepped and draped in the usual sterile fashion.  ? ? ? ? ?Clinical Data: ?No additional findings. ? ? ?Subjective: ?Chief Complaint  ?Patient presents with  ? Right Leg - Pain  ?The patient is well-known to me.  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  A steroid injection helped greatly in her right shoulder but it started to bother her again with overhead activities and reaching behind her.  She is also had right hip pain and she points to the trochanteric area as a source of her pain of her right hip.  She has a remote history of a L5-S1 fusion.  She denies any radicular pain at all.  She denies any groin pain.  She is a diabetic but her hemoglobin A1c is 5.1.  She is otherwise had no acute change in her medical status. ? ?HPI ? ?Review of Systems ?There is no listed fever, chills, nausea, vomiting ? ?Objective: ?Vital Signs: There were no vitals taken for this visit. ? ?Physical Exam ?She is alert and orient x3 and in no acute distress ?Ortho Exam ?Examination of her right shoulder shows pain past 90 degrees of abduction.  She does show some Neer and Hawkins signs and evidence of tendinosis.  Previous x-rays of the right shoulder were negative.  Examination her right hip shows fluid and full range of motion of the right hip.   There is only pain over the trochanteric area and the proximal IT band. ?Specialty Comments:  ?No specialty comments available. ? ?Imaging: ?XR HIP UNILAT W OR W/O PELVIS 1V RIGHT ? ?Result Date: 09/02/2021 ?An AP pelvis and lateral of the right hip shows normal-appearing hips bilaterally.  ? ? ?PMFS History: ?Patient Active Problem List  ? Diagnosis Date Noted  ? Essential hypertension 01/26/2019  ? Menopausal syndrome 10/01/2015  ? Depression 08/22/2012  ? Insomnia 08/22/2012  ? Metabolic syndrome 32/44/0102  ? Type 2 diabetes mellitus ([facility]) 11/28/2011  ? Hyperlipidemia 12/23/2010  ? GE reflux 12/23/2010  ? Fibromyalgia 12/23/2010  ? Breast cancer of upper-outer quadrant of left female breast ([place]) 12/23/2010  ? History of supraventricular tachycardia 12/23/2010  ? ?Past Medical History:  ?Diagnosis Date  ? Breast cancer ([place]) 2008  ? Complication of anesthesia   ? Diabetes mellitus without complication ([place])   ? Dysrhythmia   ? hx SVT  ? Fibromyalgia   ? GERD (gastroesophageal reflux disease)   ? Hyperlipidemia   ? Personal history of radiation therapy   ? left breast Ca  ? PONV (postoperative nausea and vomiting)   ? S/P lumbar fusion 2005  ? L5-S1  ? SVT (supraventricular tachycardia) ([place])   ? Umbilical hernia   ?  ?Family History  ?Problem Relation Age of Onset  ? Diabetes Sister   ? Hypertension Brother   ? Hypertension Mother   ? Diabetes Father   ? Hypertension Father   ?     pulmonary HTN  ? Cancer Father   ? Kidney disease Father   ? Breast cancer Neg Hx   ?  ?Past Surgical History:  ?Procedure Laterality Date  ? BREAST LUMPECTOMY Left 2008  ? malignant  ? INSERTION OF MESH N/A 03/21/2018  ? Procedure: INSERTION OF MESH;  Surgeon: Donnie Mesa, MD;  Location: [facility];  Service: General;  Laterality: N/A;  LMA  ? LUMBAR FUSION  2005  ? OVARY REMOVED  1997  ? UMBILICAL HERNIA REPAIR N/A 03/21/2018  ? Procedure: HERNIA REPAIR UMBILICAL ADULT WITH POSSIBLE MESH;  Surgeon: Donnie Mesa, MD;   Location: [facility];  Service: General;  Laterality: N/A;  LMA  ? ?Social History  ? ?Occupational History  ? Not on file  ?Tobacco Use  ? Smoking status: Never  ? Smokeless tobacco: Never  ?Substance and Sexual Activity  ? Alcohol use: No  ?  Alcohol/week: 0.0 standard drinks  ? Drug use: No  ? Sexual activity: Not on file  ? ? ? ? ? ? ?

## 2021-09-08 DIAGNOSIS — D631 Anemia in chronic kidney disease: Secondary | ICD-10-CM | POA: Diagnosis not present

## 2021-09-08 DIAGNOSIS — N1832 Chronic kidney disease, stage 3b: Secondary | ICD-10-CM | POA: Diagnosis not present

## 2021-09-08 DIAGNOSIS — M1990 Unspecified osteoarthritis, unspecified site: Secondary | ICD-10-CM | POA: Diagnosis not present

## 2021-09-08 DIAGNOSIS — I129 Hypertensive chronic kidney disease with stage 1 through stage 4 chronic kidney disease, or unspecified chronic kidney disease: Secondary | ICD-10-CM | POA: Diagnosis not present

## 2021-09-08 DIAGNOSIS — E1122 Type 2 diabetes mellitus with diabetic chronic kidney disease: Secondary | ICD-10-CM | POA: Diagnosis not present

## 2021-09-08 LAB — HEPATIC FUNCTION PANEL
ALT: 25 U/L (ref 7–35)
AST: 32 (ref 13–35)
Alkaline Phosphatase: 32 (ref 25–125)
Bilirubin, Total: 0.3

## 2021-09-08 LAB — IRON,TIBC AND FERRITIN PANEL
%SAT: 15
Ferritin: 58
Iron: 74
TIBC: 490
UIBC: 416

## 2021-09-08 LAB — BASIC METABOLIC PANEL
BUN: 34 — AB (ref 4–21)
CO2: 24 — AB (ref 13–22)
Chloride: 106 (ref 99–108)
Creatinine: 1.4 — AB (ref 0.5–1.1)
Glucose: 89
Potassium: 4.2 mEq/L (ref 3.5–5.1)
Sodium: 140 (ref 137–147)

## 2021-09-08 LAB — COMPREHENSIVE METABOLIC PANEL
Albumin: 3.9 (ref 3.5–5.0)
Calcium: 9.9 (ref 8.7–10.7)
Globulin: 2.2

## 2021-09-08 LAB — MICROALBUMIN / CREATININE URINE RATIO: Microalb Creat Ratio: 4

## 2021-09-08 LAB — CBC AND DIFFERENTIAL: Hemoglobin: 10.8 — AB (ref 12.0–16.0)

## 2021-09-08 LAB — MICROALBUMIN, URINE: Microalb, Ur: 5.7

## 2021-09-11 ENCOUNTER — Encounter: Payer: Self-pay | Admitting: Internal Medicine

## 2021-09-11 ENCOUNTER — Other Ambulatory Visit: Payer: Self-pay

## 2021-09-11 ENCOUNTER — Telehealth: Payer: Self-pay | Admitting: *Deleted

## 2021-09-11 ENCOUNTER — Inpatient Hospital Stay: Payer: PPO | Attending: Oncology | Admitting: Oncology

## 2021-09-11 VITALS — BP 144/90 | HR 85 | Temp 97.5°F | Resp 17 | Ht 60.03 in | Wt 161.5 lb

## 2021-09-11 DIAGNOSIS — Z79899 Other long term (current) drug therapy: Secondary | ICD-10-CM | POA: Insufficient documentation

## 2021-09-11 DIAGNOSIS — Z90721 Acquired absence of ovaries, unilateral: Secondary | ICD-10-CM | POA: Diagnosis not present

## 2021-09-11 DIAGNOSIS — N289 Disorder of kidney and ureter, unspecified: Secondary | ICD-10-CM | POA: Insufficient documentation

## 2021-09-11 DIAGNOSIS — Z923 Personal history of irradiation: Secondary | ICD-10-CM | POA: Insufficient documentation

## 2021-09-11 DIAGNOSIS — Z7984 Long term (current) use of oral hypoglycemic drugs: Secondary | ICD-10-CM | POA: Diagnosis not present

## 2021-09-11 DIAGNOSIS — D649 Anemia, unspecified: Secondary | ICD-10-CM | POA: Insufficient documentation

## 2021-09-11 DIAGNOSIS — Z853 Personal history of malignant neoplasm of breast: Secondary | ICD-10-CM | POA: Diagnosis not present

## 2021-09-11 NOTE — Progress Notes (Addendum)
?Reason for the request:    Anemia ? ?HPI: I was asked by Dr. Renold Genta to evaluate Jill Richardson for the evaluation of anemia.  She is a 66 year old woman with a history of breast cancer dating back to 2008.  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  She had a lumpectomy and radiation and finished 7 years of hormonal therapy including tamoxifen and subsequently Femara completed in 2015.  She has no evidence of relapsed disease and has been following with survivorship clinic as well.  She was noted to have a drop in her hemoglobin on August 04, 2021.  At that time her hemoglobin was 10.7, MCV was 96.7.  Platelets of 317 and white cell count of 3.8.  Previous hemoglobin in 2022 was 12.1.  Additional labs showed a creatinine of 1.46 with a creatinine clearance around 36 cc/min.  Iron studies showed iron level of 88 with increased TIBC and saturation of 17%.  Her folic acid was more than 24.  Vitamin B12 was 540. ? ?Clinically, he is asymptomatic at this time and has not reported any recent complaints.  She denies any hematochezia, melena or hemoptysis.  She denies any recent hospitalizations or illnesses. ? ? She does not report any headaches, blurry vision, syncope or seizures. Does not report any fevers, chills or sweats.  Does not report any cough, wheezing or hemoptysis.  Does not report any chest pain, palpitation, orthopnea or leg edema.  Does not report any nausea, vomiting or abdominal pain.  Does not report any constipation or diarrhea.  Does not report any skeletal complaints.    Does not report frequency, urgency or hematuria.  Does not report any skin rashes or lesions. Does not report any heat or cold intolerance.  Does not report any lymphadenopathy or petechiae.  Does not report any anxiety or depression.  Remaining review of systems is negative.  ? ? ? ?Past Medical History:  ?Diagnosis Date  ? Breast cancer ([facility]) 2008  ? Complication of anesthesia   ? Diabetes  mellitus without complication ([facility])   ? Dysrhythmia   ? hx SVT  ? Fibromyalgia   ? GERD (gastroesophageal reflux disease)   ? Hyperlipidemia   ? Personal history of radiation therapy   ? left breast Ca  ? PONV (postoperative nausea and vomiting)   ? S/P lumbar fusion 2005  ? L5-S1  ? SVT (supraventricular tachycardia) ([facility])   ? Umbilical hernia   ?: ? ? ?Past Surgical History:  ?Procedure Laterality Date  ? BREAST LUMPECTOMY Left 2008  ? malignant  ? INSERTION OF MESH N/A 03/21/2018  ? Procedure: INSERTION OF MESH;  Surgeon: Donnie Mesa, MD;  Location: [facility];  Service: General;  Laterality: N/A;  LMA  ? LUMBAR FUSION  2005  ? OVARY REMOVED  1997  ? UMBILICAL HERNIA REPAIR N/A 03/21/2018  ? Procedure: HERNIA REPAIR UMBILICAL ADULT WITH POSSIBLE MESH;  Surgeon: Donnie Mesa, MD;  Location: [facility];  Service: General;  Laterality: N/A;  LMA  ?: ? ? ?Current Outpatient Medications:  ?  amLODipine (NORVASC) 5 MG tablet, Take 1 tablet (5 mg total) by mouth daily., Disp: 90 tablet, Rfl: 3 ?  calcium-vitamin D (OSCAL WITH D) 500-200 MG-UNIT per tablet, Take 1 tablet by mouth 2 (two) times daily., Disp: , Rfl:  ?  cyclobenzaprine (FLEXERIL) 10 MG tablet, Take 1 tablet (10 mg total) by mouth 3 (three) times daily  as needed for muscle spasms., Disp: 30 tablet, Rfl: 5 ?  fenofibrate 160 MG tablet, Take 1 tablet (160 mg total) by mouth daily., Disp: 90 tablet, Rfl: 3 ?  glucosamine-chondroitin 500-400 MG tablet, Take 1 tablet by mouth daily., Disp: , Rfl:  ?  glucose blood (ACCU-CHEK GUIDE) test strip, USE TO CHECK BLOOD SUGAR 2 TIMES A DAY AS INSTRUCTED, Disp: 100 each, Rfl: 12 ?  JANUVIA 100 MG tablet, Take 1 tablet (100 mg total) by mouth daily., Disp: 90 tablet, Rfl: 1 ?  LORazepam (ATIVAN) 2 MG tablet, Take 1 tablet (2 mg total) by mouth at bedtime., Disp: 90 tablet, Rfl: 1 ?  losartan (COZAAR) 100 MG tablet, Take 1 tablet (100 mg total) by mouth daily., Disp: 90 tablet, Rfl: 3 ?   metFORMIN (GLUCOPHAGE) 500 MG tablet, Take 1 tablet (500 mg total) by mouth 2 (two) times daily., Disp: 180 tablet, Rfl: 3 ?  Multiple Vitamin (MULTIVITAMIN) capsule, Take 1 capsule by mouth daily., Disp: , Rfl:  ?  oxaprozin (DAYPRO) 600 MG tablet, Take 2 tablets (1,200 mg total) by mouth daily., Disp: 180 tablet, Rfl: 2 ?  pantoprazole (PROTONIX) 40 MG tablet, Take 1 tablet (40 mg total) by mouth daily., Disp: 90 tablet, Rfl: 3 ?  rosuvastatin (CRESTOR) 10 MG tablet, Take 1 tablet (10 mg total) by mouth daily., Disp: 90 tablet, Rfl: 3 ?  valACYclovir (VALTREX) 500 MG tablet, Take 1 tablet (500 mg total) by mouth daily for 5 days, Disp: 90 tablet, Rfl: 3 ?  venlafaxine XR (EFFEXOR-XR) 75 MG 24 hr capsule, Take 1 capsule (75 mg total) by mouth daily with breakfast., Disp: 90 capsule, Rfl: 3: ? ? ?Allergies  ?Allergen Reactions  ? Lipitor  [Atorvastatin] Other (See Comments)  ? Lipitor [Atorvastatin Calcium] Other (See Comments)  ?  Muscle pain  ? Lodine [Etodolac] Other (See Comments)  ?  STEVENS JOHNSONS   ?: ? ? ?Family History  ?Problem Relation Age of Onset  ? Diabetes Sister   ? Hypertension Brother   ? Hypertension Mother   ? Diabetes Father   ? Hypertension Father   ?     pulmonary HTN  ? Cancer Father   ? Kidney disease Father   ? Breast cancer Neg Hx   ?: ? ? ?Social History  ? ?Socioeconomic History  ? Marital status: Married  ?  Spouse name: Not on file  ? Number of children: Not on file  ? Years of education: Not on file  ? Highest education level: Not on file  ?Occupational History  ? Not on file  ?Tobacco Use  ? Smoking status: Never  ? Smokeless tobacco: Never  ?Substance and Sexual Activity  ? Alcohol use: No  ?  Alcohol/week: 0.0 standard drinks  ? Drug use: No  ? Sexual activity: Not on file  ?Other Topics Concern  ? Not on file  ?Social History Narrative  ? Social history: She is married and has grandchildren.  Husband is disabled due to a chronic brain injury after an accident in the mid 75s.   2 adult sons.  ?    ? Family history: Father with history of diabetes and hypertension died from complications of heart failure.  Mother still living.  Brother with history of hypertension.  Sister in good health.  ? ?Social Determinants of Health  ? ?Financial Resource Strain: Not on file  ?Food Insecurity: Not on file  ?Transportation Needs: Not on file  ?Physical Activity: Not on file  ?  Stress: Not on file  ?Social Connections: Not on file  ?Intimate Partner Violence: Not on file  ?: ? ?Pertinent items are noted in HPI. ? ?Exam: ?Blood pressure (!) 144/90, pulse 85, temperature (!) 97.5 ?F (36.4 ?C), temperature source Temporal, resp. rate 17, height 5' 0.03" (1.525 m), weight 161 lb 8 oz (73.3 kg), SpO2 100 %. ?ECOG 0 ?General appearance: alert and cooperative appeared without distress. ?Head: atraumatic without any abnormalities. ?Eyes: conjunctivae/corneas clear. PERRL.  Sclera anicteric. ?Throat: lips, mucosa, and tongue normal; without oral thrush or ulcers. ?Resp: clear to auscultation bilaterally without rhonchi, wheezes or dullness to percussion. ?Cardio: regular rate and rhythm, S1, S2 normal, no murmur, click, rub or gallop ?GI: soft, non-tender; bowel sounds normal; no masses,  no organomegaly ?Skin: Skin color, texture, turgor normal. No rashes or lesions ?Lymph nodes: Cervical, supraclavicular, and axillary nodes normal. ?Neurologic: Grossly normal without any motor, sensory or deep tendon reflexes. ?Musculoskeletal: No joint deformity or effusion. ? ? ? ?MM 3D SCREEN BREAST BILATERAL ? ?Result Date: 08/17/2021 ?CLINICAL DATA:  Screening. EXAM: DIGITAL SCREENING BILATERAL MAMMOGRAM WITH TOMOSYNTHESIS AND CAD TECHNIQUE: Bilateral screening digital craniocaudal and mediolateral oblique mammograms were obtained. Bilateral screening digital breast tomosynthesis was performed. The images were evaluated with computer-aided detection. COMPARISON:  Previous exam(s). ACR Breast Density Category b: There are  scattered areas of fibroglandular density. FINDINGS: There are no findings suspicious for malignancy. IMPRESSION: No mammographic evidence of malignancy. A result letter of this screening mammogram will be

## 2021-09-11 NOTE — Addendum Note (Signed)
Addended by: Wyatt Portela on: 09/11/2021 12:06 PM ? ? Modules accepted: Orders ? ?

## 2021-09-11 NOTE — Telephone Encounter (Signed)
PC to patient, informed her we received her lab results from [facility], Dr. Alen Blew  has reviewed them and she does not need iron supplements at this time.  Patient verbalizes understanding. ?

## 2021-09-17 DIAGNOSIS — N1832 Chronic kidney disease, stage 3b: Secondary | ICD-10-CM | POA: Diagnosis not present

## 2021-09-17 DIAGNOSIS — N281 Cyst of kidney, acquired: Secondary | ICD-10-CM | POA: Diagnosis not present

## 2021-09-17 DIAGNOSIS — N200 Calculus of kidney: Secondary | ICD-10-CM | POA: Diagnosis not present

## 2021-09-21 ENCOUNTER — Other Ambulatory Visit (HOSPITAL_BASED_OUTPATIENT_CLINIC_OR_DEPARTMENT_OTHER): Payer: Self-pay

## 2021-09-22 DIAGNOSIS — D649 Anemia, unspecified: Secondary | ICD-10-CM | POA: Diagnosis not present

## 2021-10-06 ENCOUNTER — Other Ambulatory Visit (HOSPITAL_COMMUNITY): Payer: Self-pay

## 2021-10-20 ENCOUNTER — Telehealth: Payer: Self-pay | Admitting: Internal Medicine

## 2021-10-20 NOTE — Telephone Encounter (Signed)
scheduled

## 2021-10-20 NOTE — Telephone Encounter (Signed)
Jill Richardson ?[phone] ? ?[name] called to say she went off her anti-inflammatory medication 2 weeks ago because of the damaged it does to your kidneys and now she is hurting in her joints, she would like to come in and discuss what is an alternative. ?

## 2021-10-22 ENCOUNTER — Other Ambulatory Visit (HOSPITAL_COMMUNITY): Payer: Self-pay

## 2021-10-22 ENCOUNTER — Ambulatory Visit (INDEPENDENT_AMBULATORY_CARE_PROVIDER_SITE_OTHER): Payer: PPO | Admitting: Internal Medicine

## 2021-10-22 ENCOUNTER — Encounter: Payer: Self-pay | Admitting: Internal Medicine

## 2021-10-22 VITALS — BP 134/72 | HR 84 | Temp 97.4°F | Ht 60.03 in | Wt 162.2 lb

## 2021-10-22 DIAGNOSIS — Z6831 Body mass index (BMI) 31.0-31.9, adult: Secondary | ICD-10-CM

## 2021-10-22 DIAGNOSIS — Z853 Personal history of malignant neoplasm of breast: Secondary | ICD-10-CM

## 2021-10-22 DIAGNOSIS — I10 Essential (primary) hypertension: Secondary | ICD-10-CM | POA: Diagnosis not present

## 2021-10-22 DIAGNOSIS — E1169 Type 2 diabetes mellitus with other specified complication: Secondary | ICD-10-CM

## 2021-10-22 DIAGNOSIS — F5101 Primary insomnia: Secondary | ICD-10-CM

## 2021-10-22 DIAGNOSIS — D649 Anemia, unspecified: Secondary | ICD-10-CM | POA: Diagnosis not present

## 2021-10-22 DIAGNOSIS — M2550 Pain in unspecified joint: Secondary | ICD-10-CM | POA: Diagnosis not present

## 2021-10-22 DIAGNOSIS — F439 Reaction to severe stress, unspecified: Secondary | ICD-10-CM | POA: Diagnosis not present

## 2021-10-22 DIAGNOSIS — M797 Fibromyalgia: Secondary | ICD-10-CM

## 2021-10-22 DIAGNOSIS — E785 Hyperlipidemia, unspecified: Secondary | ICD-10-CM

## 2021-10-22 DIAGNOSIS — R768 Other specified abnormal immunological findings in serum: Secondary | ICD-10-CM | POA: Diagnosis not present

## 2021-10-22 MED ORDER — TRAMADOL HCL 50 MG PO TABS
50.0000 mg | ORAL_TABLET | Freq: Two times a day (BID) | ORAL | 0 refills | Status: AC | PRN
  Filled 2021-10-22: qty 60, 30d supply, fill #0

## 2021-10-22 NOTE — Progress Notes (Signed)
? ?  Subjective:  ? ? Patient ID: Jill Richardson, female    DOB: December 08, 1955, 66 y.o.   MRN: [id number] ? ?HPI Here to discuss multi-jpoint pain. Saw Dr. Ninfa Linden, orthopedist right hip trochanteric bursistis and was diagnosed with right right trochanteric bursitis.  It was injected.  With steroid. ? ?She also saw Dr. Alen Blew regarding anemia with history of breast cancer.  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  She had lumpectomy and radiation therapy.  Finished 7 years of hormonal therapy in 2015.  In February we found that her hemoglobin had dropped to 10.7 g with a normal MCV.  White blood cell count was 3800 and platelet count 317,000.  Previous hemoglobin in 2022 was 12.1. ? ?Also was found to have elevated serum creatinine of 1.46 and was referred to [facility].  History of chronic NSAID therapy for joint pain.  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 ? ?Situational stress with husband who has cancer.  He is disabled due to a chronic injury after an accident in the mid 95s.  She has 2 adult sons.  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 ? ? ? ?Review of Systems continues to have multijoint pain but no real hot or swollen joints. ? ?Not sure if she is a candidate for rheumatology evaluation but today we drew ANA, CCP, sed rate and rheumatoid factor.  Issue may be more with osteoarthritis than inflammatory arthritis. ? ?She cannot continue to take NSAIDs due to chronic kidney disease. ? ?May be a candidate for chronic pain medication such as tramadol.  We have agreed to try this. ? ?   ?Objective:  ? Physical Exam ? ?Continues to have pain in her wrist and hands as well as lower extremities.  These joints are not red or hot. ? ?BP 134/72, pulse 84 regular pulse oximetry 98% BMI 31.66 ?   ?Assessment & Plan:  ?Likely has inflammatory arthritis-Labs  pending ? ?Normocytic anemia-hemoglobin 1 month ago was 10.8 g.  Myeloma has been ruled out.  Iron deficiency ruled out ? ?Chronic kidney disease likely from NSAID therapy for musculoskeletal pain.  Creatinine 1.40 ? ?Remote history of breast cancer-Dr. Alen Blew does not think this is an issue with her anemia ? ?Plan: Rheumatology studies drawn with results pending.  Further recommendations to follow.  However, have placed her on tramadol 50 mg 1 tablet every 8 hours as needed for pain.  She may need to be enrolled in chronic pain management program. ? ?

## 2021-10-22 NOTE — Patient Instructions (Signed)
Rheumatology studies drawn and pending.  Patient placed on tramadol 50 mg up to 3 times daily as needed for pain.  Further recommendations to follow pending lab results. ?

## 2021-10-26 NOTE — Addendum Note (Signed)
Addended by: Angus Seller on: 10/26/2021 09:09 AM ? ? Modules accepted: Orders ? ?

## 2021-10-27 LAB — ANA: Anti Nuclear Antibody (ANA): POSITIVE — AB

## 2021-10-27 LAB — ANTI-SMITH ANTIBODY: ENA SM Ab Ser-aCnc: <1 AI

## 2021-10-27 LAB — ANTI-NUCLEAR AB-TITER (ANA TITER): ANA Titer 1: 1:40 {titer} — ABNORMAL HIGH

## 2021-10-27 LAB — CYCLIC CITRUL PEPTIDE ANTIBODY, IGG: Cyclic Citrullin Peptide Ab: <16 UNITS

## 2021-10-27 LAB — RHEUMATOID FACTOR: Rheumatoid fact SerPl-aCnc: <14 IU/mL (ref <14)

## 2021-10-27 LAB — SEDIMENTATION RATE: Sed Rate: 11 mm/h (ref 0–30)

## 2021-11-02 ENCOUNTER — Other Ambulatory Visit (HOSPITAL_COMMUNITY): Payer: Self-pay

## 2021-11-03 ENCOUNTER — Other Ambulatory Visit (HOSPITAL_COMMUNITY): Payer: Self-pay

## 2021-11-11 DIAGNOSIS — D509 Iron deficiency anemia, unspecified: Secondary | ICD-10-CM | POA: Diagnosis not present

## 2021-11-16 ENCOUNTER — Other Ambulatory Visit (HOSPITAL_COMMUNITY): Payer: Self-pay

## 2021-11-24 ENCOUNTER — Other Ambulatory Visit (HOSPITAL_COMMUNITY): Payer: Self-pay

## 2021-12-22 ENCOUNTER — Other Ambulatory Visit (HOSPITAL_COMMUNITY): Payer: Self-pay

## 2022-01-04 ENCOUNTER — Other Ambulatory Visit (HOSPITAL_COMMUNITY): Payer: Self-pay

## 2022-01-04 ENCOUNTER — Other Ambulatory Visit: Payer: Self-pay | Admitting: Internal Medicine

## 2022-01-04 MED ORDER — VENLAFAXINE HCL ER 75 MG PO CP24
75.0000 mg | ORAL_CAPSULE | Freq: Every day | ORAL | 3 refills | Status: DC
  Filled 2022-01-04 – 2022-01-31 (×2): qty 90, 90d supply, fill #0
  Filled 2022-05-06: qty 90, 90d supply, fill #1
  Filled 2022-08-02: qty 90, 90d supply, fill #2
  Filled 2022-11-04: qty 90, 90d supply, fill #3

## 2022-01-07 ENCOUNTER — Telehealth: Payer: Self-pay | Admitting: Oncology

## 2022-01-07 NOTE — Telephone Encounter (Signed)
Called patient regarding upcoming September appointments, patient is notified.  

## 2022-02-01 ENCOUNTER — Other Ambulatory Visit (HOSPITAL_COMMUNITY): Payer: Self-pay

## 2022-02-01 ENCOUNTER — Other Ambulatory Visit: Payer: PPO

## 2022-02-01 DIAGNOSIS — D649 Anemia, unspecified: Secondary | ICD-10-CM

## 2022-02-01 DIAGNOSIS — R7989 Other specified abnormal findings of blood chemistry: Secondary | ICD-10-CM

## 2022-02-02 ENCOUNTER — Encounter: Payer: Self-pay | Admitting: Internal Medicine

## 2022-02-02 ENCOUNTER — Other Ambulatory Visit (HOSPITAL_COMMUNITY): Payer: Self-pay

## 2022-02-02 ENCOUNTER — Ambulatory Visit (INDEPENDENT_AMBULATORY_CARE_PROVIDER_SITE_OTHER): Payer: PPO | Admitting: Internal Medicine

## 2022-02-02 ENCOUNTER — Other Ambulatory Visit: Payer: Self-pay | Admitting: Internal Medicine

## 2022-02-02 VITALS — BP 138/84 | HR 89 | Temp 98.2°F | Wt 161.0 lb

## 2022-02-02 DIAGNOSIS — M2550 Pain in unspecified joint: Secondary | ICD-10-CM | POA: Diagnosis not present

## 2022-02-02 DIAGNOSIS — Z6829 Body mass index (BMI) 29.0-29.9, adult: Secondary | ICD-10-CM | POA: Diagnosis not present

## 2022-02-02 DIAGNOSIS — E611 Iron deficiency: Secondary | ICD-10-CM | POA: Diagnosis not present

## 2022-02-02 DIAGNOSIS — E1169 Type 2 diabetes mellitus with other specified complication: Secondary | ICD-10-CM

## 2022-02-02 DIAGNOSIS — E8881 Metabolic syndrome: Secondary | ICD-10-CM | POA: Diagnosis not present

## 2022-02-02 DIAGNOSIS — I10 Essential (primary) hypertension: Secondary | ICD-10-CM | POA: Diagnosis not present

## 2022-02-02 DIAGNOSIS — F5101 Primary insomnia: Secondary | ICD-10-CM

## 2022-02-02 DIAGNOSIS — M7918 Myalgia, other site: Secondary | ICD-10-CM

## 2022-02-02 DIAGNOSIS — M797 Fibromyalgia: Secondary | ICD-10-CM | POA: Diagnosis not present

## 2022-02-02 DIAGNOSIS — M1991 Primary osteoarthritis, unspecified site: Secondary | ICD-10-CM | POA: Diagnosis not present

## 2022-02-02 DIAGNOSIS — Z853 Personal history of malignant neoplasm of breast: Secondary | ICD-10-CM

## 2022-02-02 DIAGNOSIS — F439 Reaction to severe stress, unspecified: Secondary | ICD-10-CM | POA: Diagnosis not present

## 2022-02-02 DIAGNOSIS — G8929 Other chronic pain: Secondary | ICD-10-CM

## 2022-02-02 DIAGNOSIS — M7071 Other bursitis of hip, right hip: Secondary | ICD-10-CM | POA: Diagnosis not present

## 2022-02-02 DIAGNOSIS — Z1589 Genetic susceptibility to other disease: Secondary | ICD-10-CM

## 2022-02-02 DIAGNOSIS — F3289 Other specified depressive episodes: Secondary | ICD-10-CM | POA: Diagnosis not present

## 2022-02-02 DIAGNOSIS — R5383 Other fatigue: Secondary | ICD-10-CM | POA: Diagnosis not present

## 2022-02-02 DIAGNOSIS — E663 Overweight: Secondary | ICD-10-CM | POA: Diagnosis not present

## 2022-02-02 DIAGNOSIS — M79641 Pain in right hand: Secondary | ICD-10-CM | POA: Diagnosis not present

## 2022-02-02 DIAGNOSIS — M79642 Pain in left hand: Secondary | ICD-10-CM | POA: Diagnosis not present

## 2022-02-02 DIAGNOSIS — R768 Other specified abnormal immunological findings in serum: Secondary | ICD-10-CM | POA: Diagnosis not present

## 2022-02-02 DIAGNOSIS — E785 Hyperlipidemia, unspecified: Secondary | ICD-10-CM

## 2022-02-02 LAB — BASIC METABOLIC PANEL
BUN/Creatinine Ratio: 22 (calc) (ref 6–22)
BUN: 24 mg/dL (ref 7–25)
CO2: 27 mmol/L (ref 20–32)
Calcium: 9.8 mg/dL (ref 8.6–10.4)
Chloride: 108 mmol/L (ref 98–110)
Creat: 1.1 mg/dL — ABNORMAL HIGH (ref 0.50–1.05)
Glucose, Bld: 86 mg/dL (ref 65–99)
Potassium: 4.3 mmol/L (ref 3.5–5.3)
Sodium: 143 mmol/L (ref 135–146)

## 2022-02-02 LAB — IRON: Iron: 64 ug/dL (ref 45–160)

## 2022-02-02 MED ORDER — ONETOUCH DELICA PLUS LANCET33G MISC
0 refills | Status: DC
  Filled 2022-02-02: qty 100, 25d supply, fill #0

## 2022-02-02 MED ORDER — ONETOUCH ULTRA VI STRP
ORAL_STRIP | 0 refills | Status: DC
  Filled 2022-02-02: qty 100, 25d supply, fill #0

## 2022-02-02 MED ORDER — HYDROCODONE-ACETAMINOPHEN 10-325 MG PO TABS
1.0000 | ORAL_TABLET | Freq: Four times a day (QID) | ORAL | 0 refills | Status: AC | PRN
  Filled 2022-02-02: qty 15, 4d supply, fill #0

## 2022-02-02 MED ORDER — LOSARTAN POTASSIUM 100 MG PO TABS
100.0000 mg | ORAL_TABLET | Freq: Every day | ORAL | 3 refills | Status: DC
  Filled 2022-02-02 – 2022-04-27 (×2): qty 90, 90d supply, fill #0
  Filled 2022-05-13 – 2022-08-02 (×2): qty 90, 90d supply, fill #1
  Filled 2022-10-28: qty 90, 90d supply, fill #2
  Filled 2023-01-25: qty 90, 90d supply, fill #3

## 2022-02-02 MED ORDER — LORAZEPAM 2 MG PO TABS
2.0000 mg | ORAL_TABLET | Freq: Every day | ORAL | 1 refills | Status: DC
  Filled 2022-02-02: qty 90, 90d supply, fill #0
  Filled 2022-06-21 – 2022-06-22 (×3): qty 90, 90d supply, fill #1

## 2022-02-02 MED ORDER — FENOFIBRATE 160 MG PO TABS
160.0000 mg | ORAL_TABLET | Freq: Every day | ORAL | 3 refills | Status: DC
  Filled 2022-02-02: qty 90, 90d supply, fill #0
  Filled 2022-04-27: qty 90, 90d supply, fill #1
  Filled 2022-08-02: qty 90, 90d supply, fill #2
  Filled 2022-10-28: qty 90, 90d supply, fill #3

## 2022-02-02 MED ORDER — VALACYCLOVIR HCL 500 MG PO TABS
500.0000 mg | ORAL_TABLET | Freq: Every day | ORAL | 3 refills | Status: DC
  Filled 2022-02-02: qty 90, 90d supply, fill #0
  Filled 2022-05-13: qty 90, 90d supply, fill #1
  Filled 2022-08-22: qty 90, 90d supply, fill #2
  Filled 2022-12-06: qty 90, 90d supply, fill #3

## 2022-02-02 MED ORDER — BLOOD GLUCOSE METER KIT
1.0000 | PACK | Freq: Four times a day (QID) | 0 refills | Status: AC
  Filled 2022-02-02: qty 1, 1d supply, fill #0

## 2022-02-02 NOTE — Progress Notes (Signed)
   Subjective:    Patient ID: Jill Richardson, female    DOB: 19-Oct-1955, 66 y.o.   MRN: [id number]  HPI Here for follow up. Is to see [facility] and Dr. Alen Blew (hematology) in September.  Is still working some part-time in [employer].  Hx CKD stage 3b. Creatinine is 1.10. Has improved from 1.4.  Iron level is 64.  Suggest increase iron sulfate to 2 tabs a day.   Seeing Dr. Trudie Reed for musculoskeletal pain.Having to take multiple drugs to get relief from pain. Have sent in 5 day supply of Norco.  Review of Systems see above-has chronic bilateral low back pain without sciatica.  Having chronic right shoulder pain.  To see orthopedist in the near future.  May need to get shoulder injection.     Objective:   Physical Exam  She looks fatigued and I think depressed due to chronic pain.  Skin: Warm and dry.  Neck supple.  No carotid bruits.  Chest clear.  Cardiac exam: Regular rate and rhythm.        Assessment & Plan:  Musculoskeletal pain has positive ANA and negative CCP.  Sed rate is 11 from April 2023 and rheumatoid factor was negative.  Seen at [facility] and diagnosed with primary localized osteoarthritis of multiple sites.  Is HLA-B27 positive.  Chronic kidney disease stage IIIb-stable  History of iron deficiency but anemia studies in February 2023 did not show iron deficiency  Type 2 diabetes mellitus treated with Januvia and metformin  GE reflux treated with Protonix  Hyperlipidemia treated with rosuvastatin and fenofibrate  Depression treated with Effexor   Hypertension treated with losartan and amlodipine  Situational stress with husband and poor health  History of anemia followed by Dr. Alen Blew.  Thought to be due to possibly chronic kidney disease or iron deficiency.  This was diagnosed in February 2023 with a hemoglobin of 10.7 g.  Plan: Physical exam due in February 2024.  Continue follow-up with Rheumatology and  hematology/oncology.

## 2022-02-03 ENCOUNTER — Other Ambulatory Visit (HOSPITAL_COMMUNITY): Payer: Self-pay

## 2022-02-17 ENCOUNTER — Ambulatory Visit: Payer: PPO | Admitting: Orthopaedic Surgery

## 2022-02-17 ENCOUNTER — Ambulatory Visit (INDEPENDENT_AMBULATORY_CARE_PROVIDER_SITE_OTHER): Payer: PPO

## 2022-02-17 ENCOUNTER — Encounter: Payer: Self-pay | Admitting: Orthopaedic Surgery

## 2022-02-17 DIAGNOSIS — M545 Low back pain, unspecified: Secondary | ICD-10-CM

## 2022-02-17 DIAGNOSIS — M25511 Pain in right shoulder: Secondary | ICD-10-CM

## 2022-02-17 DIAGNOSIS — G8929 Other chronic pain: Secondary | ICD-10-CM

## 2022-02-17 MED ORDER — LIDOCAINE HCL 1 % IJ SOLN
3.0000 mL | INTRAMUSCULAR | Status: AC | PRN
  Administered 2022-02-17: 3 mL

## 2022-02-17 MED ORDER — METHYLPREDNISOLONE ACETATE 40 MG/ML IJ SUSP
40.0000 mg | INTRAMUSCULAR | Status: AC | PRN
  Administered 2022-02-17: 40 mg via INTRA_ARTICULAR

## 2022-02-17 NOTE — Progress Notes (Addendum)
Office Visit Note   Patient: Jill Richardson           Date of Birth: [birth year]           MRN: [id number] Visit Date: 02/17/2022              Requested by: Elby Showers, MD [address] [place],  [place] [address] PCP: Elby Showers, MD   Assessment & Plan: Visit Diagnoses:  1. Chronic bilateral low back pain without sciatica   2. Chronic right shoulder pain     Plan: Given patient's low back pain with radicular symptoms into both thighs and changes on plain films recommend referral to [place] [facility].  We will make this referral for her.  Regards to her shoulder we will see her back as needed she is doing at least 3 months between injections.  Questions were encouraged and answered at length by Dr. Ninfa Linden and myself.  Follow-Up Instructions: Return if symptoms worsen or fail to improve.   Orders:  Orders Placed This Encounter  Procedures  . Large Joint Inj: R subacromial bursa  . XR Lumbar Spine 2-3 Views   Meds ordered this encounter  Medications  . lidocaine (XYLOCAINE) 1 % (with pres) injection 3 mL  . methylPREDNISolone acetate (DEPO-MEDROL) injection 40 mg      Procedures: Large Joint Inj: R subacromial bursa on 02/17/2022 9:48 AM Indications: pain Details: 22 G 1.5 in needle, superior approach  Arthrogram: No  Medications: 3 mL lidocaine 1 %; 40 mg methylPREDNISolone acetate 40 MG/ML Outcome: tolerated well, no immediate complications Procedure, treatment alternatives, risks and benefits explained, specific risks discussed. Consent was given by the patient. Immediately prior to procedure a time out was called to verify the correct patient, procedure, equipment, support staff and site/side marked as required. Patient was prepped and draped in the usual sterile fashion.      Clinical Data: No additional findings.   Subjective: Chief Complaint  Patient presents with  . Lower Back - Pain    HPI Jill Richardson 66 year old female well-known  to Dr. Delilah Shan service comes in today with low back pain and right shoulder pain.  She states she is now having pain low back and it is getting worse she is now having some pain down into both thighs does not go below the knees.  Denies any numbness tingling.  Denies any bowel bladder changes saddle anesthesia like symptoms or unintentional weight loss.  She notes that she has low back pain that awakens her at night.  States her back pain is 7 out of 10 pain at worst.  History of lumbar surgery by Dr. Carloyn Manner in the past.  Notes that her trochanteric injection on the right side on 09/02/2021 gave her no relief.  She has tried Voltaren gel over the hip without any relief.  She is also requesting right shoulder injection if she is found these beneficial in the past.  She has had no acute injury to the right shoulder.  Denies any numbness tingling down either arm.  Review of Systems See HPI otherwise negative  Objective: Vital Signs: There were no vitals taken for this visit.  Physical Exam Constitutional:      Appearance: She is not ill-appearing or diaphoretic.  Cardiovascular:     Pulses: Normal pulses.  Pulmonary:     Effort: Pulmonary effort is normal.  Neurological:     Mental Status: She is alert and oriented to person, place, and time.  Psychiatric:  Mood and Affect: Mood normal.    Ortho Exam Bilateral hips excellent range of motion without pain. Lumbar spine she has limited flexion and extension with discomfort with extension.  Nontender over the lumbar spine paraspinous region bilaterally.  Positive straight leg raise on the right negative on the left.  5 out of 5 strength throughout the lower extremities except for dorsiflexion of the right foot against resistance which is 4 out of 5.  Sensation grossly intact bilateral feet.  Dorsal pedal pulses are 2+ and equal and symmetric.   Specialty Comments:  No specialty comments available.  Imaging: XR Lumbar Spine 2-3 Views  Result  Date: 02/17/2022 Lumbar spine 2 views: No acute fracture.  Status post right cages L5-S1.  Increase scoliosis since prior films in 2010.  Appears to progress to a grade 2 anterior spinal listhesis at L5-S1.  Facet changes lower lumbar spine.  No acute findings otherwise.Prior ray cage fusion L5-S1 without hardware failure.    PMFS History: Patient Active Problem List   Diagnosis Date Noted  . Essential hypertension 01/26/2019  . Menopausal syndrome 10/01/2015  . Depression 08/22/2012  . Insomnia 08/22/2012  . Metabolic syndrome 36/62/9476  . Type 2 diabetes mellitus ([facility]) 11/28/2011  . Hyperlipidemia 12/23/2010  . GE reflux 12/23/2010  . Fibromyalgia 12/23/2010  . Breast cancer of upper-outer quadrant of left female breast ([facility]) 12/23/2010  . History of supraventricular tachycardia 12/23/2010   Past Medical History:  Diagnosis Date  . Breast cancer ([facility]) 2008  . Complication of anesthesia   . Diabetes mellitus without complication ([facility])   . Dysrhythmia    hx SVT  . Fibromyalgia   . GERD (gastroesophageal reflux disease)   . Hyperlipidemia   . Personal history of radiation therapy    left breast Ca  . PONV (postoperative nausea and vomiting)   . S/P lumbar fusion 2005   L5-S1  . SVT (supraventricular tachycardia) ([facility])   . Umbilical hernia     Family History  Problem Relation Age of Onset  . Diabetes Sister   . Hypertension Brother   . Hypertension Mother   . Diabetes Father   . Hypertension Father        pulmonary HTN  . Cancer Father   . Kidney disease Father   . Breast cancer Neg Hx     Past Surgical History:  Procedure Laterality Date  . BREAST LUMPECTOMY Left 2008   malignant  . INSERTION OF MESH N/A 03/21/2018   Procedure: INSERTION OF MESH;  Surgeon: Donnie Mesa, MD;  Location: [facility];  Service: General;  Laterality: N/A;  LMA  . LUMBAR FUSION  2005  . OVARY REMOVED  1997  . UMBILICAL HERNIA REPAIR N/A 03/21/2018   Procedure: HERNIA  REPAIR UMBILICAL ADULT WITH POSSIBLE MESH;  Surgeon: Donnie Mesa, MD;  Location: [facility];  Service: General;  Laterality: N/A;  LMA   Social History   Occupational History  . Not on file  Tobacco Use  . Smoking status: Never  . Smokeless tobacco: Never  Substance and Sexual Activity  . Alcohol use: No    Alcohol/week: 0.0 standard drinks of alcohol  . Drug use: No  . Sexual activity: Not on file

## 2022-02-18 ENCOUNTER — Other Ambulatory Visit: Payer: Self-pay

## 2022-02-18 DIAGNOSIS — G8929 Other chronic pain: Secondary | ICD-10-CM

## 2022-02-22 DIAGNOSIS — N1832 Chronic kidney disease, stage 3b: Secondary | ICD-10-CM | POA: Diagnosis not present

## 2022-03-01 ENCOUNTER — Other Ambulatory Visit (HOSPITAL_COMMUNITY): Payer: Self-pay

## 2022-03-02 ENCOUNTER — Other Ambulatory Visit (HOSPITAL_COMMUNITY): Payer: Self-pay

## 2022-03-02 DIAGNOSIS — I129 Hypertensive chronic kidney disease with stage 1 through stage 4 chronic kidney disease, or unspecified chronic kidney disease: Secondary | ICD-10-CM | POA: Diagnosis not present

## 2022-03-02 DIAGNOSIS — E1122 Type 2 diabetes mellitus with diabetic chronic kidney disease: Secondary | ICD-10-CM | POA: Diagnosis not present

## 2022-03-02 DIAGNOSIS — E785 Hyperlipidemia, unspecified: Secondary | ICD-10-CM | POA: Diagnosis not present

## 2022-03-02 DIAGNOSIS — M1990 Unspecified osteoarthritis, unspecified site: Secondary | ICD-10-CM | POA: Diagnosis not present

## 2022-03-02 DIAGNOSIS — N1832 Chronic kidney disease, stage 3b: Secondary | ICD-10-CM | POA: Diagnosis not present

## 2022-03-09 ENCOUNTER — Other Ambulatory Visit (HOSPITAL_COMMUNITY): Payer: Self-pay

## 2022-03-09 DIAGNOSIS — M79641 Pain in right hand: Secondary | ICD-10-CM | POA: Diagnosis not present

## 2022-03-09 DIAGNOSIS — M1991 Primary osteoarthritis, unspecified site: Secondary | ICD-10-CM | POA: Diagnosis not present

## 2022-03-09 DIAGNOSIS — E663 Overweight: Secondary | ICD-10-CM | POA: Diagnosis not present

## 2022-03-09 DIAGNOSIS — Z6828 Body mass index (BMI) 28.0-28.9, adult: Secondary | ICD-10-CM | POA: Diagnosis not present

## 2022-03-09 DIAGNOSIS — M79642 Pain in left hand: Secondary | ICD-10-CM | POA: Diagnosis not present

## 2022-03-09 DIAGNOSIS — Z1589 Genetic susceptibility to other disease: Secondary | ICD-10-CM | POA: Diagnosis not present

## 2022-03-09 DIAGNOSIS — M5440 Lumbago with sciatica, unspecified side: Secondary | ICD-10-CM | POA: Diagnosis not present

## 2022-03-09 MED ORDER — METHYLPREDNISOLONE 4 MG PO TBPK
ORAL_TABLET | ORAL | 0 refills | Status: DC
  Filled 2022-03-09: qty 21, 6d supply, fill #0

## 2022-03-11 ENCOUNTER — Inpatient Hospital Stay: Payer: PPO | Admitting: Oncology

## 2022-03-11 ENCOUNTER — Inpatient Hospital Stay: Payer: PPO | Attending: Oncology

## 2022-03-11 VITALS — BP 140/82 | HR 94 | Temp 97.7°F | Resp 16 | Ht 60.0 in | Wt 156.1 lb

## 2022-03-11 DIAGNOSIS — Z7952 Long term (current) use of systemic steroids: Secondary | ICD-10-CM | POA: Diagnosis not present

## 2022-03-11 DIAGNOSIS — N189 Chronic kidney disease, unspecified: Secondary | ICD-10-CM | POA: Insufficient documentation

## 2022-03-11 DIAGNOSIS — Z79899 Other long term (current) drug therapy: Secondary | ICD-10-CM | POA: Diagnosis not present

## 2022-03-11 DIAGNOSIS — Z79624 Long term (current) use of inhibitors of nucleotide synthesis: Secondary | ICD-10-CM | POA: Diagnosis not present

## 2022-03-11 DIAGNOSIS — Z853 Personal history of malignant neoplasm of breast: Secondary | ICD-10-CM | POA: Insufficient documentation

## 2022-03-11 DIAGNOSIS — D649 Anemia, unspecified: Secondary | ICD-10-CM | POA: Diagnosis not present

## 2022-03-11 DIAGNOSIS — Z7984 Long term (current) use of oral hypoglycemic drugs: Secondary | ICD-10-CM | POA: Diagnosis not present

## 2022-03-11 LAB — CBC WITH DIFFERENTIAL (CANCER CENTER ONLY)
Abs Immature Granulocytes: 0 10*3/uL (ref 0.00–0.07)
Basophils Absolute: 0 10*3/uL (ref 0.0–0.1)
Basophils Relative: 1 %
Eosinophils Absolute: 0.2 10*3/uL (ref 0.0–0.5)
Eosinophils Relative: 5 %
HCT: 33.8 % — ABNORMAL LOW (ref 36.0–46.0)
Hemoglobin: 11.1 g/dL — ABNORMAL LOW (ref 12.0–15.0)
Immature Granulocytes: 0 %
Lymphocytes Relative: 24 %
Lymphs Abs: 1 10*3/uL (ref 0.7–4.0)
MCH: 30.7 pg (ref 26.0–34.0)
MCHC: 32.8 g/dL (ref 30.0–36.0)
MCV: 93.4 fL (ref 80.0–100.0)
Monocytes Absolute: 0.4 10*3/uL (ref 0.1–1.0)
Monocytes Relative: 10 %
Neutro Abs: 2.5 10*3/uL (ref 1.7–7.7)
Neutrophils Relative %: 60 %
Platelet Count: 298 10*3/uL (ref 150–400)
RBC: 3.62 MIL/uL — ABNORMAL LOW (ref 3.87–5.11)
RDW: 15.2 % (ref 11.5–15.5)
WBC Count: 4.2 10*3/uL (ref 4.0–10.5)
nRBC: 0 % (ref 0.0–0.2)

## 2022-03-11 LAB — IRON AND IRON BINDING CAPACITY (CC-WL,HP ONLY)
Iron: 49 ug/dL (ref 28–170)
Saturation Ratios: 10 % — ABNORMAL LOW (ref 10.4–31.8)
TIBC: 483 ug/dL — ABNORMAL HIGH (ref 250–450)
UIBC: 434 ug/dL (ref 148–442)

## 2022-03-11 LAB — FERRITIN: Ferritin: 25 ng/mL (ref 11–307)

## 2022-03-11 NOTE — Progress Notes (Signed)
Hematology and Oncology Follow Up Visit  Jill Richardson [id number] 1956/06/05 66 y.o. 03/11/2022 12:09 PM Jill Richardson, MDBaxley, Cresenciano Lick, MD   Principle Diagnosis: 66 year old woman with anemia diagnosed in February 2023.  She presented with a hemoglobin of 10.7.  Differential diagnosis including chronic kidney disease related anemia versus iron deficiency.     Current therapy: Active surveillance.  Interim History: Jill Richardson returns today for a follow-up visit.  Since last visit, she reports that no major changes in her health.  She is involved in the care of her husband who has significant deterioration in his performance status related to illness.  She denies any nausea, vomiting or abdominal pain.  She denies excessive fatigue or tiredness.     Medications: I have reviewed the patient's current medications.  Current Outpatient Medications  Medication Sig Dispense Refill   amLODipine (NORVASC) 5 MG tablet Take 1 tablet (5 mg total) by mouth daily. 90 tablet 3   blood glucose meter kit and supplies Use to check blood sugar up to 4 times daily as directed 1 each 0   calcium-vitamin D (OSCAL WITH D) 500-200 MG-UNIT per tablet Take 1 tablet by mouth 2 (two) times daily.     cyclobenzaprine (FLEXERIL) 10 MG tablet Take 1 tablet (10 mg total) by mouth 3 (three) times daily as needed for muscle spasms. 30 tablet 5   fenofibrate 160 MG tablet Take 1 tablet (160 mg total) by mouth daily. 90 tablet 3   ferrous sulfate 325 (65 FE) MG tablet 1 tablet     glucosamine-chondroitin 500-400 MG tablet Take 1 tablet by mouth daily.     glucose blood (ACCU-CHEK GUIDE) test strip USE TO CHECK BLOOD SUGAR 2 TIMES A DAY AS INSTRUCTED 100 each 12   glucose blood (ONETOUCH ULTRA) test strip Use to test blood sugar up to 4 times daily 100 each 0   JANUVIA 100 MG tablet Take 1 tablet (100 mg total) by mouth daily. 90 tablet 1   Lancets (ONETOUCH DELICA PLUS [id number]) MISC Use to check blood sugar up to 4  times daily as needed 100 each 0   LORazepam (ATIVAN) 2 MG tablet Take 1 tablet (2 mg total) by mouth at bedtime. 90 tablet 1   losartan (COZAAR) 100 MG tablet Take 1 tablet (100 mg total) by mouth daily. 90 tablet 3   metFORMIN (GLUCOPHAGE) 500 MG tablet Take 1 tablet (500 mg total) by mouth 2 (two) times daily. 180 tablet 3   methylPREDNISolone (MEDROL DOSEPAK) 4 MG TBPK tablet Take as directed. 21 tablet 0   Multiple Vitamin (MULTIVITAMIN) capsule Take 1 capsule by mouth daily.     pantoprazole (PROTONIX) 40 MG tablet Take 1 tablet (40 mg total) by mouth daily. 90 tablet 3   rosuvastatin (CRESTOR) 10 MG tablet Take 1 tablet (10 mg total) by mouth daily. 90 tablet 3   valACYclovir (VALTREX) 500 MG tablet Take 1 tablet (500 mg total) by mouth daily for 5 days 90 tablet 3   venlafaxine XR (EFFEXOR-XR) 75 MG 24 hr capsule Take 1 capsule (75 mg total) by mouth daily with breakfast. 90 capsule 3   No current facility-administered medications for this visit.     Allergies:  Allergies  Allergen Reactions   Lipitor  [Atorvastatin] Other (See Comments)   Lipitor [Atorvastatin Calcium] Other (See Comments)    Muscle pain   Lodine [Etodolac] Other (See Comments)    STEVENS JOHNSONS       Physical  Exam: Blood pressure (!) 140/82, pulse 94, temperature 97.7 F (36.5 C), temperature source Temporal, resp. rate 16, height 5' (1.524 m), weight 156 lb 1.6 oz (70.8 kg), SpO2 99 %.  ECOG: 1   General appearance: Alert, awake without any distress. Head: Atraumatic without abnormalities Oropharynx: Without any thrush or ulcers. Eyes: No scleral icterus. Lymph nodes: No lymphadenopathy noted in the cervical, supraclavicular, or axillary nodes Heart:regular rate and rhythm, without any murmurs or gallops.   Lung: Clear to auscultation without any rhonchi, wheezes or dullness to percussion. Abdomin: Soft, nontender without any shifting dullness or ascites. Musculoskeletal: No clubbing or  cyanosis. Neurological: No motor or sensory deficits. Skin: No rashes or lesions.     Lab Results: Lab Results  Component Value Date   WBC 3.8 08/04/2021   HGB 10.8 (A) 09/08/2021   HCT 32.0 (L) 08/04/2021   MCV 96.7 08/04/2021   PLT 317 08/04/2021     Chemistry      Component Value Date/Time   NA 143 02/01/2022 1202   NA 140 09/08/2021 0000   NA 144 05/14/2014 0822   K 4.3 02/01/2022 1202   K 4.3 05/14/2014 0822   CL 108 02/01/2022 1202   CO2 27 02/01/2022 1202   CO2 26 05/14/2014 0822   BUN 24 02/01/2022 1202   BUN 34 (A) 09/08/2021 0000   BUN 23.3 05/14/2014 0822   CREATININE 1.10 (H) 02/01/2022 1202   CREATININE 1.0 05/14/2014 0822   GLU 89 09/08/2021 0000      Component Value Date/Time   CALCIUM 9.8 02/01/2022 1202   CALCIUM 9.9 05/14/2014 0822   ALKPHOS 32 09/08/2021 0000   ALKPHOS 28 (L) 05/14/2014 0822   AST 32 09/08/2021 0000   AST 23 05/14/2014 0822   ALT 25 09/08/2021 0000   ALT 28 05/14/2014 0822   BILITOT 0.4 08/04/2021 0917   BILITOT 0.34 05/14/2014 0822          Impression and Plan:  65 year old with:  1.  Anemia documented in February 2023 with a hemoglobin of 10.7 with work-up did not reveal any iron deficiency.  She does have chronic kidney disease which could be contributing.    Laboratory data from today reviewed and showed a hemoglobin of 11.1 with normal MCV, white cell count and platelet count.  Her degree of anemia remains mild and likely related to chronic kidney disease.  Myelodysplastic syndrome could be a consideration but is less likely.  I recommended continued monitoring and consider growth factor support if she has decline of hemoglobin below 10.     2.  Renal insufficiency: She continues to follow with nephrology regarding this issue.   3.  History of breast cancer: She is currently on active surveillance and up-to-date on her mammography.     4.  Follow-up: In 6 months for a follow-up.   20  minutes were spent on  this encounter.  Time was dedicated to reviewing laboratory data, disease status update of care discussion.        Zola Button, MD 9/14/202312:09 PM

## 2022-03-11 NOTE — Patient Instructions (Addendum)
Continue close follow-up with multiple specialists including rheumatology, hematology/oncology, nephrology.  Follow-up here in 6 months.  For severe pain given Norco 10/325 and she may take 1/2 to 1 tablet every 6-8 hours if needed for severe pain.

## 2022-03-12 ENCOUNTER — Ambulatory Visit: Payer: PPO | Admitting: Oncology

## 2022-03-12 ENCOUNTER — Other Ambulatory Visit: Payer: PPO

## 2022-03-12 ENCOUNTER — Telehealth: Payer: Self-pay | Admitting: Oncology

## 2022-03-12 LAB — KAPPA/LAMBDA LIGHT CHAINS
Kappa free light chain: 44.5 mg/L — ABNORMAL HIGH (ref 3.3–19.4)
Kappa, lambda light chain ratio: 1.84 — ABNORMAL HIGH (ref 0.26–1.65)
Lambda free light chains: 24.2 mg/L (ref 5.7–26.3)

## 2022-03-12 NOTE — Telephone Encounter (Signed)
Per 9/14 los called and spoke to pt about appointment

## 2022-03-17 LAB — MULTIPLE MYELOMA PANEL, SERUM
Albumin SerPl Elph-Mcnc: 3.5 g/dL (ref 2.9–4.4)
Albumin/Glob SerPl: 1.3 (ref 0.7–1.7)
Alpha 1: 0.3 g/dL (ref 0.0–0.4)
Alpha2 Glob SerPl Elph-Mcnc: 0.7 g/dL (ref 0.4–1.0)
B-Globulin SerPl Elph-Mcnc: 0.9 g/dL (ref 0.7–1.3)
Gamma Glob SerPl Elph-Mcnc: 1 g/dL (ref 0.4–1.8)
Globulin, Total: 2.9 g/dL (ref 2.2–3.9)
IgA: 132 mg/dL (ref 87–352)
IgG (Immunoglobin G), Serum: 942 mg/dL (ref 586–1602)
IgM (Immunoglobulin M), Srm: 129 mg/dL (ref 26–217)
Total Protein ELP: 6.4 g/dL (ref 6.0–8.5)

## 2022-04-15 DIAGNOSIS — M5126 Other intervertebral disc displacement, lumbar region: Secondary | ICD-10-CM | POA: Diagnosis not present

## 2022-04-15 DIAGNOSIS — M5440 Lumbago with sciatica, unspecified side: Secondary | ICD-10-CM | POA: Diagnosis not present

## 2022-04-18 IMAGING — MG MM DIGITAL SCREENING BILAT W/ TOMO AND CAD
8 series · 8 of 24 positions shown · non-contrast
Comparison: Previous exam(s).

CLINICAL DATA: Screening.

EXAM:
DIGITAL SCREENING BILATERAL MAMMOGRAM WITH TOMOSYNTHESIS AND CAD
TECHNIQUE: Bilateral screening digital craniocaudal and mediolateral oblique
mammograms were obtained. Bilateral screening digital breast
tomosynthesis was performed. The images were evaluated with
computer-aided detection.

[L MLO synth-2D]
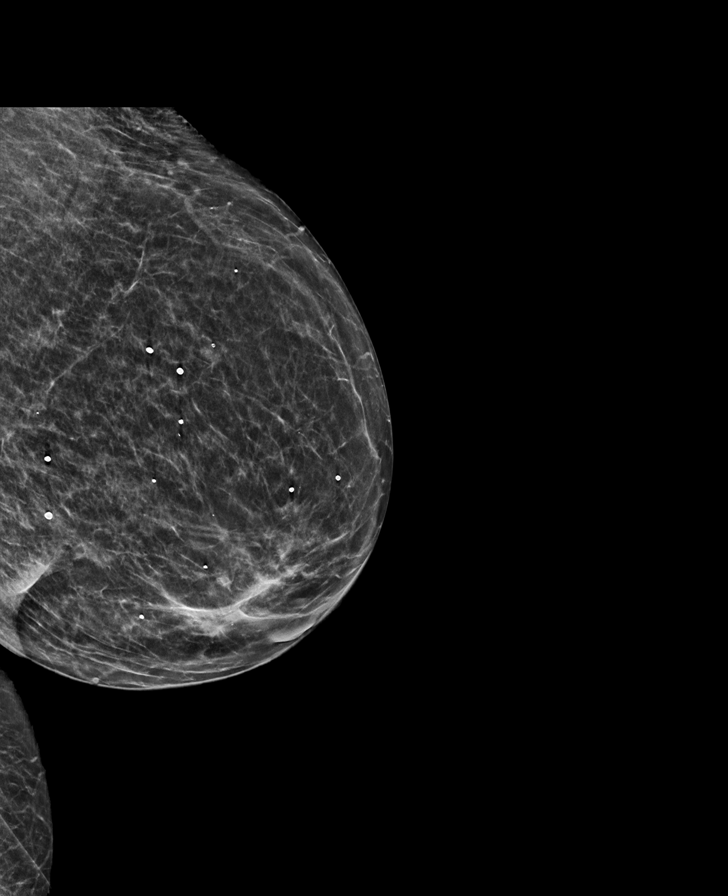

[R CC synth-2D]
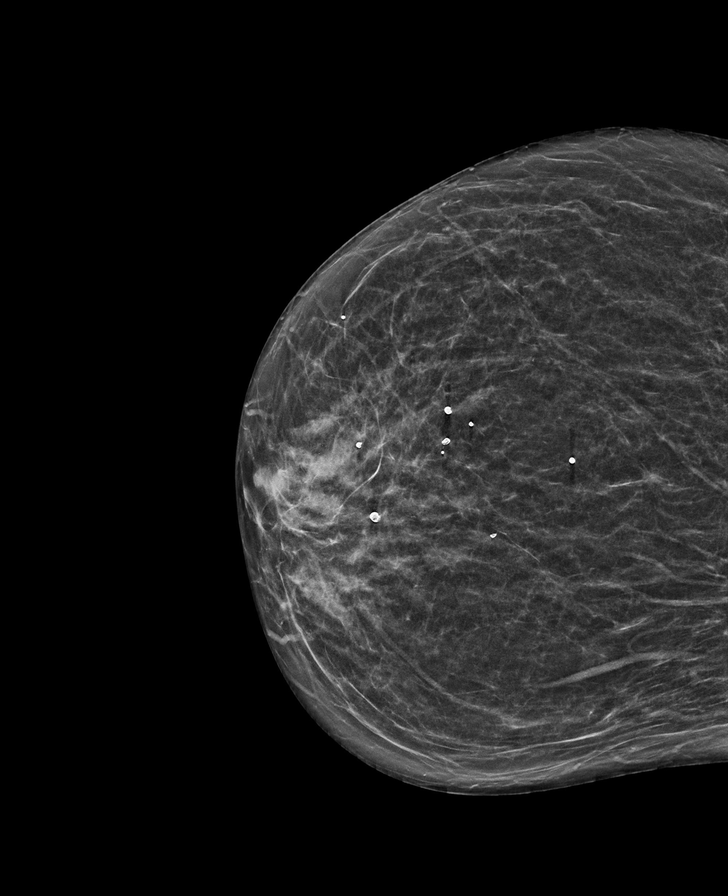

[R MLO synth-2D]
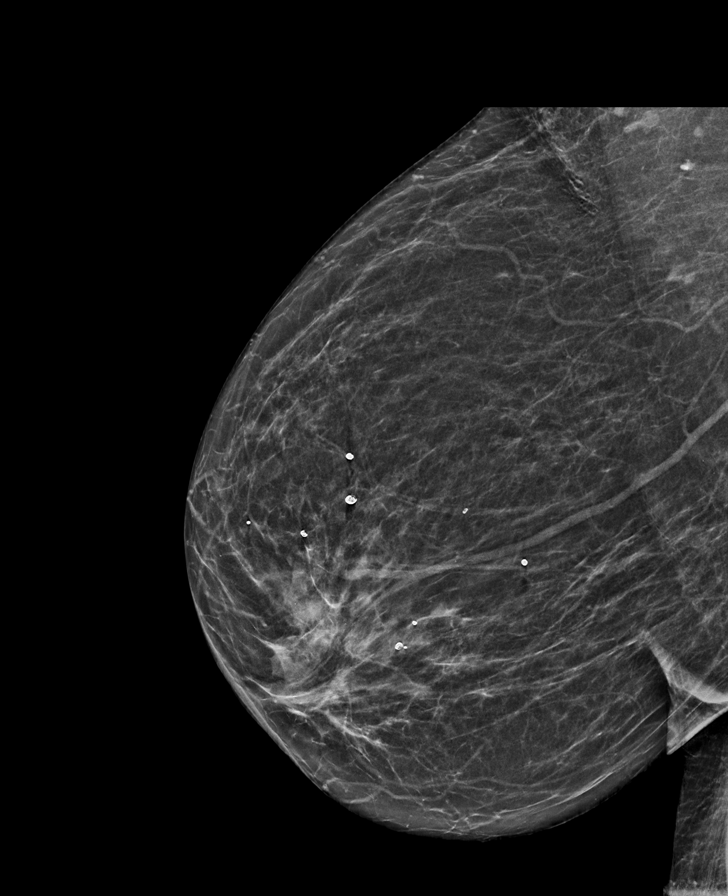

[L CC synth-2D]
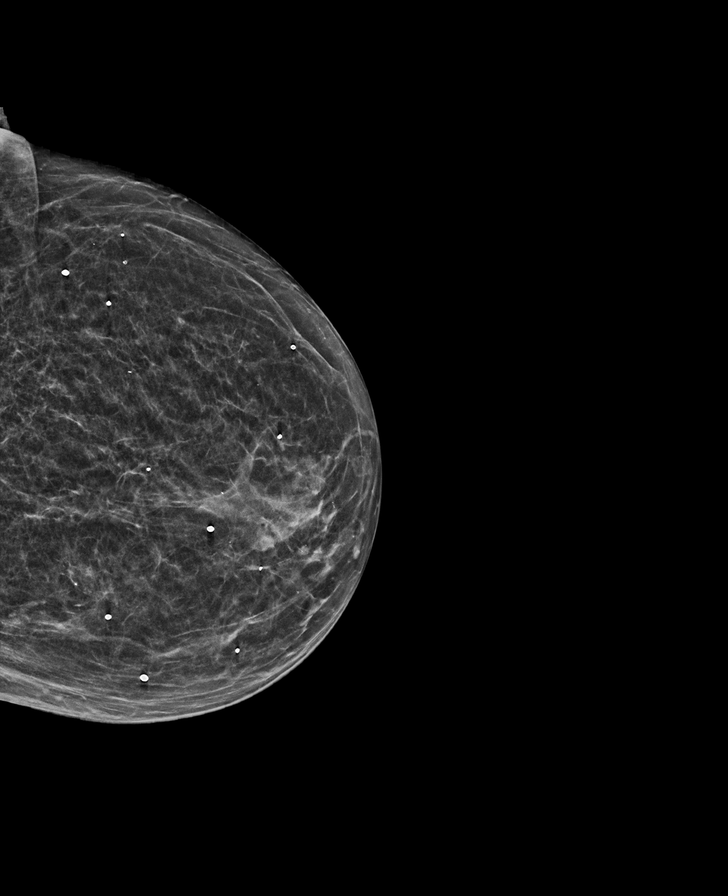

[R CC tomo · tomo slice 27/54.0]
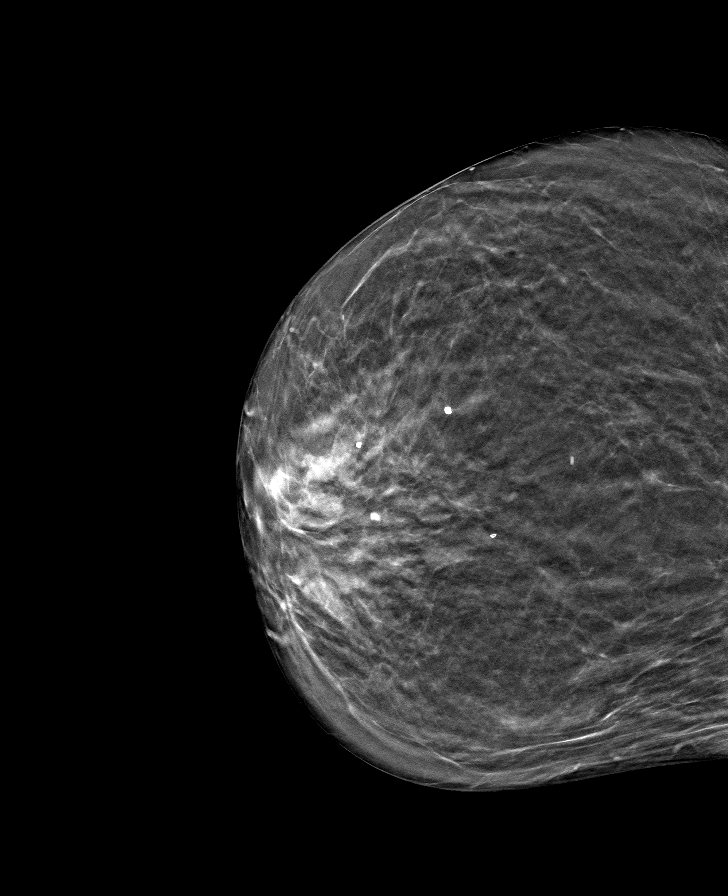

[L MLO tomo · tomo slice 27/53.0]
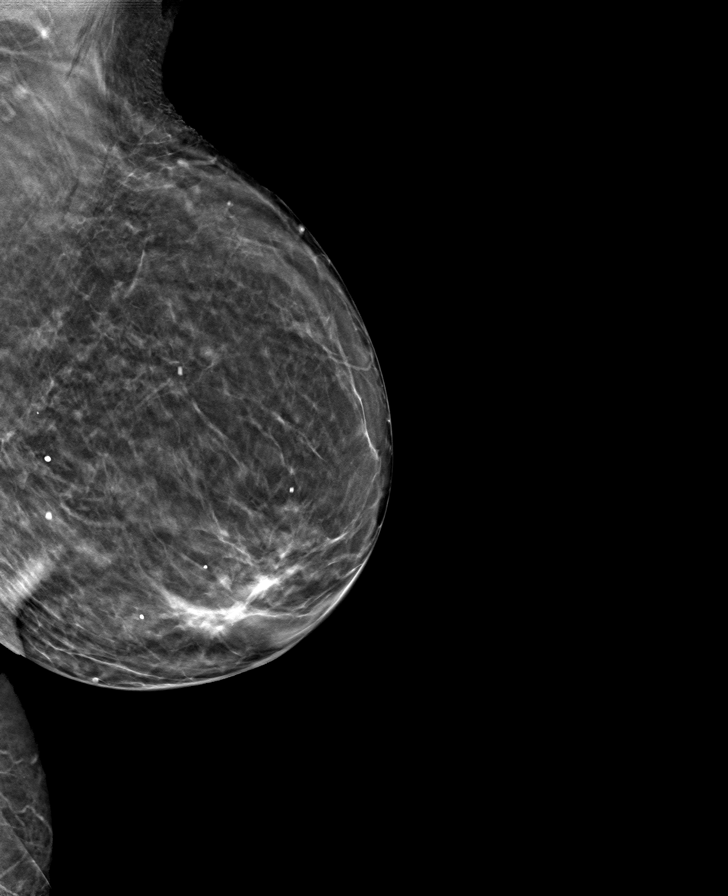

[L CC tomo · tomo slice 26/51.0]
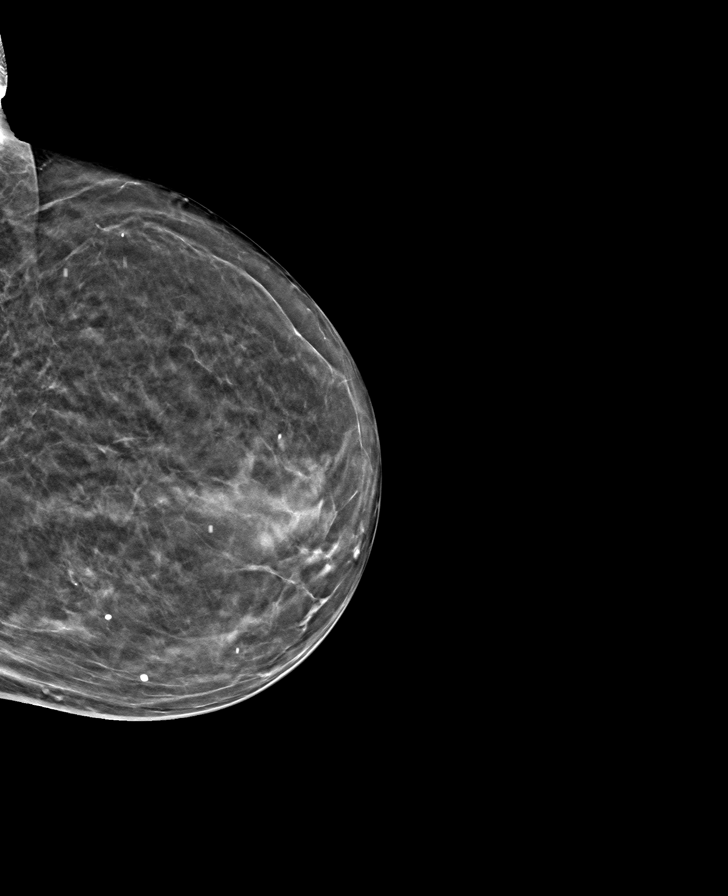

[R MLO tomo · tomo slice 29/58.0]
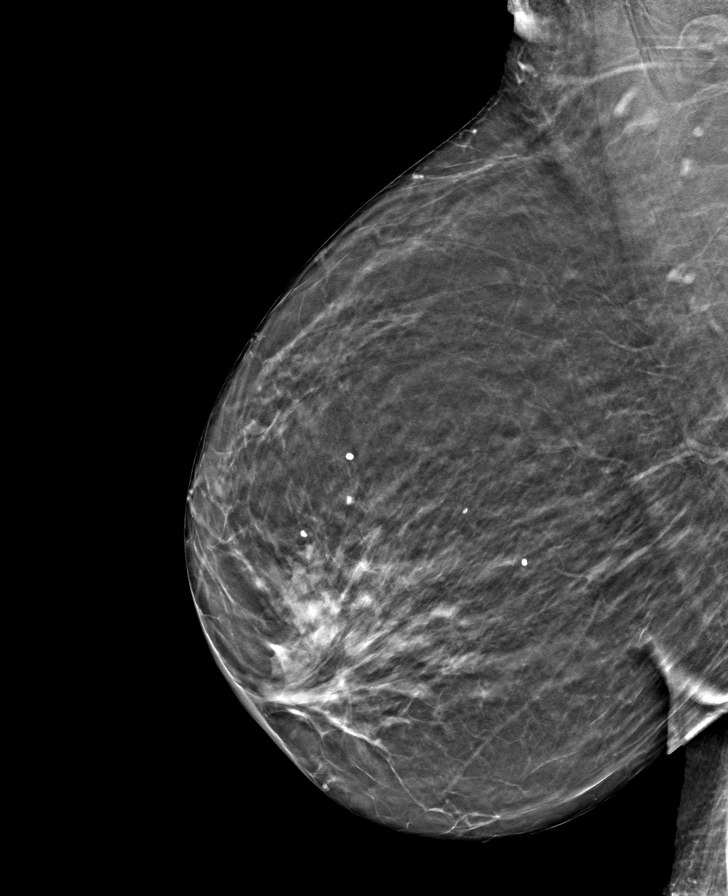

[8 of 24 positions shown; findings below may reference images not displayed]

ACR Breast Density Category b: There are scattered areas of
fibroglandular density.
FINDINGS: There are no findings suspicious for malignancy.
IMPRESSION: No mammographic evidence of malignancy. A result letter of this
screening mammogram will be mailed directly to the patient.

RECOMMENDATION:
Screening mammogram in one year. (Code:51-O-LD2)

BI-RADS CATEGORY  1: Negative.

## 2022-04-27 ENCOUNTER — Other Ambulatory Visit (HOSPITAL_COMMUNITY): Payer: Self-pay

## 2022-04-27 ENCOUNTER — Other Ambulatory Visit: Payer: Self-pay | Admitting: Internal Medicine

## 2022-04-27 MED ORDER — CYCLOBENZAPRINE HCL 10 MG PO TABS
10.0000 mg | ORAL_TABLET | Freq: Three times a day (TID) | ORAL | 1 refills | Status: DC | PRN
  Filled 2022-04-27: qty 90, 30d supply, fill #0
  Filled 2022-12-06: qty 90, 30d supply, fill #1

## 2022-04-28 ENCOUNTER — Other Ambulatory Visit (HOSPITAL_COMMUNITY): Payer: Self-pay

## 2022-05-04 DIAGNOSIS — M4316 Spondylolisthesis, lumbar region: Secondary | ICD-10-CM | POA: Diagnosis not present

## 2022-05-04 DIAGNOSIS — M5440 Lumbago with sciatica, unspecified side: Secondary | ICD-10-CM | POA: Diagnosis not present

## 2022-05-04 DIAGNOSIS — M4120 Other idiopathic scoliosis, site unspecified: Secondary | ICD-10-CM | POA: Diagnosis not present

## 2022-05-06 ENCOUNTER — Other Ambulatory Visit (HOSPITAL_COMMUNITY): Payer: Self-pay

## 2022-05-06 ENCOUNTER — Telehealth: Payer: Self-pay | Admitting: Internal Medicine

## 2022-05-06 NOTE — Telephone Encounter (Signed)
Completed and faxed back Chronic Condition Verification Form to [employer] [phone], phone [phone]   This message was sent via [facility], a product from [employer] [email or web address]://www.[email or web address]/                    -------Fax Transmission Report-------  To:               Recipient at [phone] Subject:          FW: Hp Scans Result:           The transmission was successful. Explanation:      All Pages Ok Pages Sent:       2 Connect Time:     1 minutes, 4 seconds Transmit Time:    05/06/2022 12:31 Transfer Rate:    14400 Status Code:      0000 Retry Count:      0 Job Id:           [id number] Unique Id:        [id number] Fax Line:         24 Fax Server:       [employer]

## 2022-05-13 ENCOUNTER — Other Ambulatory Visit (HOSPITAL_COMMUNITY): Payer: Self-pay

## 2022-05-13 ENCOUNTER — Encounter (HOSPITAL_COMMUNITY): Payer: Self-pay

## 2022-05-14 ENCOUNTER — Other Ambulatory Visit (HOSPITAL_COMMUNITY): Payer: Self-pay

## 2022-05-17 DIAGNOSIS — M6281 Muscle weakness (generalized): Secondary | ICD-10-CM | POA: Diagnosis not present

## 2022-05-17 DIAGNOSIS — M545 Low back pain, unspecified: Secondary | ICD-10-CM | POA: Diagnosis not present

## 2022-05-21 ENCOUNTER — Other Ambulatory Visit (HOSPITAL_COMMUNITY): Payer: Self-pay

## 2022-05-26 DIAGNOSIS — M545 Low back pain, unspecified: Secondary | ICD-10-CM | POA: Diagnosis not present

## 2022-05-28 DIAGNOSIS — M545 Low back pain, unspecified: Secondary | ICD-10-CM | POA: Diagnosis not present

## 2022-06-02 DIAGNOSIS — M545 Low back pain, unspecified: Secondary | ICD-10-CM | POA: Diagnosis not present

## 2022-06-04 DIAGNOSIS — M545 Low back pain, unspecified: Secondary | ICD-10-CM | POA: Diagnosis not present

## 2022-06-15 ENCOUNTER — Other Ambulatory Visit: Payer: Self-pay | Admitting: Neurosurgery

## 2022-06-15 DIAGNOSIS — M4316 Spondylolisthesis, lumbar region: Secondary | ICD-10-CM | POA: Diagnosis not present

## 2022-06-15 DIAGNOSIS — Z6828 Body mass index (BMI) 28.0-28.9, adult: Secondary | ICD-10-CM | POA: Diagnosis not present

## 2022-06-21 ENCOUNTER — Other Ambulatory Visit: Payer: Self-pay | Admitting: Internal Medicine

## 2022-06-22 ENCOUNTER — Other Ambulatory Visit (HOSPITAL_COMMUNITY): Payer: Self-pay

## 2022-06-22 ENCOUNTER — Other Ambulatory Visit (HOSPITAL_BASED_OUTPATIENT_CLINIC_OR_DEPARTMENT_OTHER): Payer: Self-pay

## 2022-06-22 ENCOUNTER — Other Ambulatory Visit: Payer: Self-pay

## 2022-06-22 MED ORDER — JANUVIA 100 MG PO TABS
100.0000 mg | ORAL_TABLET | Freq: Every day | ORAL | 1 refills | Status: DC
  Filled 2022-06-22: qty 90, 90d supply, fill #0
  Filled 2022-10-28: qty 90, 90d supply, fill #1

## 2022-06-23 ENCOUNTER — Other Ambulatory Visit: Payer: Self-pay

## 2022-06-23 ENCOUNTER — Other Ambulatory Visit (HOSPITAL_COMMUNITY): Payer: Self-pay

## 2022-07-05 ENCOUNTER — Ambulatory Visit
Admission: RE | Admit: 2022-07-05 | Discharge: 2022-07-05 | Disposition: A | Discharge status: Home / Self Care | Payer: PPO | Source: Ambulatory Visit | Attending: Neurosurgery | Admitting: Neurosurgery

## 2022-07-05 DIAGNOSIS — R202 Paresthesia of skin: Secondary | ICD-10-CM | POA: Diagnosis not present

## 2022-07-05 DIAGNOSIS — M4316 Spondylolisthesis, lumbar region: Secondary | ICD-10-CM

## 2022-07-08 DIAGNOSIS — Z6828 Body mass index (BMI) 28.0-28.9, adult: Secondary | ICD-10-CM | POA: Diagnosis not present

## 2022-07-08 DIAGNOSIS — M5440 Lumbago with sciatica, unspecified side: Secondary | ICD-10-CM | POA: Diagnosis not present

## 2022-07-09 ENCOUNTER — Other Ambulatory Visit (HOSPITAL_COMMUNITY): Payer: Self-pay

## 2022-07-09 MED ORDER — TRAMADOL HCL 50 MG PO TABS
ORAL_TABLET | ORAL | 0 refills | Status: DC
  Filled 2022-07-09: qty 45, 12d supply, fill #0

## 2022-07-12 ENCOUNTER — Encounter: Payer: Self-pay | Admitting: Physician Assistant

## 2022-07-12 ENCOUNTER — Ambulatory Visit (INDEPENDENT_AMBULATORY_CARE_PROVIDER_SITE_OTHER): Payer: PPO | Admitting: Physician Assistant

## 2022-07-12 DIAGNOSIS — M25511 Pain in right shoulder: Secondary | ICD-10-CM

## 2022-07-12 DIAGNOSIS — G8929 Other chronic pain: Secondary | ICD-10-CM | POA: Diagnosis not present

## 2022-07-12 MED ORDER — LIDOCAINE HCL 1 % IJ SOLN
3.0000 mL | INTRAMUSCULAR | Status: AC | PRN
  Administered 2022-07-12: 3 mL

## 2022-07-12 MED ORDER — METHYLPREDNISOLONE ACETATE 40 MG/ML IJ SUSP
40.0000 mg | INTRAMUSCULAR | Status: AC | PRN
  Administered 2022-07-12: 40 mg via INTRA_ARTICULAR

## 2022-07-12 NOTE — Progress Notes (Signed)
   Procedure Note  Patient: Jill Richardson             Date of Birth: 10/23/1955           MRN: [id number]             Visit Date: 07/12/2022 HPI: Jill Richardson comes in today requesting repeat right shoulder injection.  She states she got good relief for a few months after last injection on 02/17/2022.  She has had no change neuro shoulder pain.  Shoulder pains been slowly returning over the last few months.  She has had no new injury.  Reports that her hemoglobin A1c is 5.1.  She has been taking Tylenol for the shoulder pain.  Review of systems: Denies any fevers or chills.  See HPI otherwise.  Physical exam: General well-developed well-nourished female no acute distress mood and affect appropriate. Bilateral shoulders 5 out of 5 strength external and internal rotation against resistance.  Forward flexion of the right shoulder actively 90 degrees passively I can bring about 175 degrees.  Positive impingement sign on the right. Procedures: Visit Diagnoses:  1. Chronic right shoulder pain     Large Joint Inj: R subacromial bursa on 07/12/2022 11:08 AM Indications: pain Details: 22 G 1.5 in needle, superior approach  Arthrogram: No  Medications: 3 mL lidocaine 1 %; 40 mg methylPREDNISolone acetate 40 MG/ML Outcome: tolerated well, no immediate complications Procedure, treatment alternatives, risks and benefits explained, specific risks discussed. Consent was given by the patient. Immediately prior to procedure a time out was called to verify the correct patient, procedure, equipment, support staff and site/side marked as required. Patient was prepped and draped in the usual sterile fashion.     Plan: She knows to watch her glucose levels closely over the next 24 to 48 hours.  She knows to wait at least 3 months between injections.  She is given a Thera-Band for shoulder exercises that she will add to the exercises are not as shown in the past.  Questions were encouraged and answered

## 2022-07-13 ENCOUNTER — Telehealth: Payer: Self-pay | Admitting: Oncology

## 2022-07-13 NOTE — Telephone Encounter (Signed)
Called patient regarding providers departure, patient is notified. Rescheduled with new provider.

## 2022-07-22 DIAGNOSIS — M5416 Radiculopathy, lumbar region: Secondary | ICD-10-CM | POA: Diagnosis not present

## 2022-07-22 DIAGNOSIS — M4316 Spondylolisthesis, lumbar region: Secondary | ICD-10-CM | POA: Diagnosis not present

## 2022-08-02 ENCOUNTER — Other Ambulatory Visit (HOSPITAL_COMMUNITY): Payer: Self-pay

## 2022-08-02 ENCOUNTER — Other Ambulatory Visit: Payer: Self-pay

## 2022-08-02 ENCOUNTER — Other Ambulatory Visit: Payer: Self-pay | Admitting: Internal Medicine

## 2022-08-02 MED ORDER — PANTOPRAZOLE SODIUM 40 MG PO TBEC
40.0000 mg | DELAYED_RELEASE_TABLET | Freq: Every day | ORAL | 3 refills | Status: DC
  Filled 2022-08-02: qty 90, 90d supply, fill #0
  Filled 2022-10-28: qty 90, 90d supply, fill #1
  Filled 2023-01-25: qty 90, 90d supply, fill #2
  Filled 2023-04-25: qty 90, 90d supply, fill #3

## 2022-08-02 MED ORDER — METFORMIN HCL 500 MG PO TABS
500.0000 mg | ORAL_TABLET | Freq: Two times a day (BID) | ORAL | 3 refills | Status: DC
  Filled 2022-08-02: qty 180, 90d supply, fill #0
  Filled 2022-10-28: qty 180, 90d supply, fill #1
  Filled 2023-01-25: qty 180, 90d supply, fill #2
  Filled 2023-05-04: qty 180, 90d supply, fill #3

## 2022-08-05 NOTE — Progress Notes (Signed)
Annual Wellness Visit    Patient Care Team: Jill Richardson, Jill Lick, MD as PCP - General (Internal Medicine) Jill Height, MD (Inactive) (Obstetrics and Gynecology) Jill Esters, MD (Inactive) (Hematology and Oncology) Jill Pita, MD (Radiation Oncology) Jill Mc, MD (Inactive) (General Surgery)  Visit Date: 08/10/22   Chief Complaint  Patient presents with   Medicare Wellness   Annual Exam    Subjective:   Patient: Jill Richardson, Female    DOB: 24-May-1956, 67 y.o.   MRN: [id number]  Jill Richardson is a 67 y.o. Female who presents today for her Annual Wellness Visit. She has a history of breast cancer, diabetes mellitus, dysrhythmia, fibromyalgia, GERD, hyperlipidemia, s/p lumbar fusion, supraventricular tachycardia, umbilical hernia, anemia, insomnia, dysplastic nevus.  Reports diarrhea for past few days. Taking Imodium, yogurt at home. No diarrhea this morning. Reports reduced diet over past few days due to diarrhea.  Reports increased back pain recently. History of L5-S1 fusion in 2005. Has had one epidural steroid injection and is scheduled for epidural injection on 08/12/21. Requesting refill of Vicodin.   Has tendonitis in her right shoulder. Will be receiving injections.  History of Type 2 diabetes mellitus treated with Januvia 100 mg daily, Glucophage 500 mg twice daily. A1C at 6.0. 08/09/22. Glucose normal at 90 on 08/09/22. Wears compression tights for varicose veins. Lost 8 pounds between 02/02/22 and 08/10/22.  History of essential hypertension treated with Cozaar 100 mg daily.  History of mixed hyperlipidemia treated with Crestor 10 mg daily.  History of fibromyalgia treated with Flexeril 10 mg three times daily as needed, Ultram 50 mg once every 6 hours as needed.  History of GERD treated with Protonix 40 mg daily.  History of depression treated with Effexor-XR 75 mg daily with breakfast.  History of chronic kidney disease stage 3. Creatinine elevated at  1.35 on 08/09/22, up from 1.10 on 02/01/22.  Mammogram last completed 08/17/21. Results show no mammographic evidence of malignancy. Recommended repeat in 2024. She is planning to schedule for 3/24.  Colonoscopy last completed 04/25/17. Results showed one benign appearing 4 mm polyp in rectum, removed with hot snare. Otherwise normal. Recommended repeat in 2028.  Pelvic exam deferred.  Husband passed away in 04/29/23. Reports some sleep difficulty but mostly good. Energy good.   Past Medical History:  Diagnosis Date   Breast cancer ([place]) [id number]   Complication of anesthesia    Diabetes mellitus without complication (HCC)    Dysrhythmia    hx SVT   Fibromyalgia    GERD (gastroesophageal reflux disease)    Hyperlipidemia    Personal history of radiation therapy    left breast Ca   PONV (postoperative nausea and vomiting)    S/P lumbar fusion 2005   L5-S1   SVT (supraventricular tachycardia)    Umbilical hernia      Family History  Problem Relation Age of Onset   Diabetes Sister    Hypertension Brother    Hypertension Mother    Diabetes Father    Hypertension Father        pulmonary HTN   Cancer Father    Kidney disease Father    Breast cancer Neg Hx      Social History   Social History Narrative   Social history: She is married and has grandchildren.  Husband is disabled due to a chronic brain injury after an accident in the mid 65s.  2 adult sons.       Family history: Father  with history of diabetes and hypertension died from complications of heart failure.  Mother still living.  Brother with history of hypertension.  Sister in good health.     Review of Systems  Constitutional:  Negative for chills, fever, malaise/fatigue and weight loss.  HENT:  Negative for hearing loss, sinus pain and sore throat.   Respiratory:  Negative for cough and hemoptysis.   Cardiovascular:  Negative for chest pain, palpitations, leg swelling and PND.  Gastrointestinal:  Negative for  abdominal pain, constipation, diarrhea, heartburn, nausea and vomiting.  Genitourinary:  Negative for dysuria, frequency and urgency.  Musculoskeletal:  Positive for back pain. Negative for myalgias and neck pain.       (+) Tendonitis in right shoulder  Skin:  Negative for itching and rash.  Neurological:  Negative for dizziness, tingling, seizures and headaches.  Endo/Heme/Allergies:  Negative for polydipsia.  Psychiatric/Behavioral:  Negative for depression. The patient is not nervous/anxious.       Objective:   Vitals: BP 104/62   Pulse 89   Temp 98.6 F (37 C) (Tympanic)   Ht 5' 0.75" (1.543 m)   Wt 153 lb (69.4 kg)   SpO2 95%   BMI 29.15 kg/m   Physical Exam Vitals and nursing note reviewed. Exam conducted with a chaperone present.  Constitutional:      General: She is not in acute distress.    Appearance: Normal appearance. She is not ill-appearing or toxic-appearing.  HENT:     Head: Normocephalic and atraumatic.     Right Ear: Hearing, tympanic membrane, ear canal and external ear normal.     Left Ear: Hearing, tympanic membrane, ear canal and external ear normal.     Mouth/Throat:     Pharynx: Oropharynx is clear.  Eyes:     Extraocular Movements: Extraocular movements intact.     Pupils: Pupils are equal, round, and reactive to light.  Neck:     Thyroid: No thyroid mass, thyromegaly or thyroid tenderness.     Vascular: No carotid bruit.  Cardiovascular:     Rate and Rhythm: Regular rhythm. Tachycardia present. No extrasystoles are present.    Pulses: Normal pulses.          Dorsalis pedis pulses are 2+ on the right side and 2+ on the left side.       Posterior tibial pulses are 2+ on the right side and 2+ on the left side.     Heart sounds: Normal heart sounds. No murmur heard.    No friction rub. No gallop.     Comments: Tachycardia appreciated on palpation (100 bpm). Superficial varicosities in lower extremities.  Pulmonary:     Effort: Pulmonary effort  is normal.     Breath sounds: Normal breath sounds. No decreased breath sounds, wheezing, rhonchi or rales.  Chest:     Chest wall: No mass.  Abdominal:     Palpations: Abdomen is soft. There is no hepatomegaly, splenomegaly or mass.     Tenderness: There is no abdominal tenderness.     Hernia: No hernia is present.  Musculoskeletal:     Cervical back: Normal range of motion.     Right lower leg: No edema.     Left lower leg: No edema.  Feet:     Right foot:     Skin integrity: Skin integrity normal. No ulcer.     Left foot:     Skin integrity: Skin integrity normal. No ulcer.     Comments: Diabetic foot  exam normal. No deformities, ulcerations, skin breakdown bilaterally. Intact to touch and monofilament testing bilaterally. PT and DP pulses intact bilaterally. Lymphadenopathy:     Cervical: No cervical adenopathy.     Upper Body:     Right upper body: No supraclavicular adenopathy.     Left upper body: No supraclavicular adenopathy.  Skin:    General: Skin is warm and dry.  Neurological:     General: No focal deficit present.     Mental Status: She is alert and oriented to person, place, and time. Mental status is at baseline.     Sensory: Sensation is intact.     Motor: Motor function is intact. No weakness.     Deep Tendon Reflexes: Reflexes are normal and symmetric.  Psychiatric:        Attention and Perception: Attention normal.        Mood and Affect: Mood normal.        Speech: Speech normal.        Behavior: Behavior normal.        Thought Content: Thought content normal.        Cognition and Memory: Cognition normal.        Judgment: Judgment normal.      Most recent functional status assessment:    08/10/2022   10:07 AM  In your present state of health, do you have any difficulty performing the following activities:  Hearing? 0  Vision? 0  Difficulty concentrating or making decisions? 0  Walking or climbing stairs? 0  Dressing or bathing? 0  Doing  errands, shopping? 0  Preparing Food and eating ? N  Using the Toilet? N  In the past six months, have you accidently leaked urine? N  Do you have problems with loss of bowel control? N  Managing your Medications? N  Managing your Finances? N  Housekeeping or managing your Housekeeping? N   Most recent fall risk assessment:    08/10/2022   10:07 AM  Fall Risk   Falls in the past year? 0  Number falls in past yr: 0  Injury with Fall? 0  Risk for fall due to : No Fall Risks  Follow up Falls prevention discussed    Most recent depression screenings:    08/10/2022   10:07 AM 08/06/2021   10:14 AM  PHQ 2/9 Scores  PHQ - 2 Score 0 0   Most recent cognitive screening:    08/10/2022   10:08 AM  6CIT Screen  What Year? 0 points  What month? 0 points  What time? 0 points  Count back from 20 0 points  Months in reverse 0 points  Repeat phrase 0 points  Total Score 0 points     Results:   Studies obtained and personally reviewed by me:  Mammogram last completed 08/17/21. Results show no mammographic evidence of malignancy. Recommended repeat in 2024. She is planning to schedule for 3/24.  Colonoscopy last completed 04/25/17. Results showed one benign appearing 4 mm polyp in rectum, removed with hot snare. Otherwise normal. Recommended repeat in 2028.   Labs:       Component Value Date/Time   NA 141 08/09/2022 0942   NA 140 09/08/2021 0000   NA 144 05/14/2014 0822   K 3.8 08/09/2022 0942   K 4.3 05/14/2014 0822   CL 106 08/09/2022 0942   CO2 25 08/09/2022 0942   CO2 26 05/14/2014 0822   GLUCOSE 90 08/09/2022 0942   GLUCOSE 147 (H) 05/14/2014  0822   BUN 18 08/09/2022 0942   BUN 34 (A) 09/08/2021 0000   BUN 23.3 05/14/2014 0822   CREATININE 1.35 (H) 08/09/2022 0942   CREATININE 1.0 05/14/2014 0822   CALCIUM 8.8 08/09/2022 0942   CALCIUM 9.9 05/14/2014 0822   PROT 6.5 08/09/2022 0942   PROT 6.8 05/14/2014 0822   ALBUMIN 3.9 09/08/2021 0000   ALBUMIN 3.4 (L)  05/14/2014 0822   AST 24 08/09/2022 0942   AST 23 05/14/2014 0822   ALT 17 08/09/2022 0942   ALT 28 05/14/2014 0822   ALKPHOS 32 09/08/2021 0000   ALKPHOS 28 (L) 05/14/2014 0822   BILITOT 0.4 08/09/2022 0942   BILITOT 0.34 05/14/2014 0822   GFRNONAA 55 (L) 07/31/2020 1118   GFRAA 63 07/31/2020 1118     Lab Results  Component Value Date   WBC 3.5 (L) 08/09/2022   HGB 11.4 (L) 08/09/2022   HCT 34.6 (L) 08/09/2022   MCV 89.2 08/09/2022   PLT 298 08/09/2022    Lab Results  Component Value Date   CHOL 127 08/09/2022   HDL 53 08/09/2022   LDLCALC 56 08/09/2022   TRIG 99 08/09/2022   CHOLHDL 2.4 08/09/2022    Lab Results  Component Value Date   HGBA1C 6.0 (H) 08/09/2022     Lab Results  Component Value Date   TSH 2.40 08/09/2022    Assessment & Plan:   Type 2 diabetes mellitus: treated with Januvia 100 mg daily, Glucophage 500 mg twice daily. A1C at 6.0 on 08/09/22. Glucose normal at 90 on 08/09/22. Wears compression tights for varicose veins. Lost 8 pounds between 02/02/22 and 08/10/22. Continue to monitor.   Back Pain: History of L5-S1 fusion in 2005. Has had one epidural steroid injection and is scheduled for epidural injection on 08/12/21. Refilled Vicodin.   Grief: Recent grief could be contributing to elevated A1C as well as back pain due to reduced physical activity. Will reassess in 6 months.Has lost a bit of weight also.  Essential Hypertension: treated with Cozaar 100 mg daily and stable  Mixed Hyperlipidemia: treated with Crestor 10 mg daily. Stable.  Fibromyalgia: treated with Flexeril 10 mg three times daily as needed, Ultram 50 mg once every 6 hours as needed. Prescribed Norco 10/325 to use sparingly for episodic severe pain #15 tab Norco 10/325 one half to one tab q 6-8 hours as needed.  GERD: treated with Protonix 40 mg daily and stable  Depression: Stable. Treated with Effexor-XR 75 mg daily with breakfast.  Anemia: Hgb stable at 11.4 grams  Elevated  Serum Creatinine: Elevated at 1.35 on 08/09/22, up from 1/10 on 02/01/22.  Insomnia:   Dysplastic Nevus: 2008.  Stage 3b chronic kidney disease seen at [facility]. Creatinine is 1.35 currently fasting  Mammogram: Last completed 08/17/21. Results show no mammographic evidence of malignancy. Recommended repeat in 2024. She is planning to schedule for 3/24. Ordered mammogram.  Colonoscopy: Last completed 04/25/17. Results showed one benign appearing 4 mm polyp in rectum, removed with hot snare. Otherwise normal. Recommended repeat in 2028.  Pelvic exam deferred.  Vaccine Counseling: UTD on tetanus, shingles, pneumonia vaccines. Discussed Covid-19, pneumococcal 20, RSV vaccines.    Return in 6 months for re-check and labs. Will need B-met, Hgb AIC, and CBC with diff as well as lipid panel.      Annual wellness visit done today including the all of the following: Reviewed patient's Family Medical History Reviewed and updated list of patient's medical providers Assessment of  cognitive impairment was done Assessed patient's functional ability Established a written schedule for health screening [place] Completed and Reviewed  Discussed health benefits of physical activity, and encouraged her to engage in regular exercise appropriate for her age and condition.        I,Alexander Ruley,acting as a Education administrator for Elby Showers, MD.,have documented all relevant documentation on the behalf of Elby Showers, MD,as directed by  Elby Showers, MD while in the presence of Elby Showers, MD.

## 2022-08-09 ENCOUNTER — Other Ambulatory Visit: Payer: PPO

## 2022-08-09 DIAGNOSIS — E119 Type 2 diabetes mellitus without complications: Secondary | ICD-10-CM

## 2022-08-09 DIAGNOSIS — I10 Essential (primary) hypertension: Secondary | ICD-10-CM | POA: Diagnosis not present

## 2022-08-09 DIAGNOSIS — E1169 Type 2 diabetes mellitus with other specified complication: Secondary | ICD-10-CM | POA: Diagnosis not present

## 2022-08-09 DIAGNOSIS — R5383 Other fatigue: Secondary | ICD-10-CM

## 2022-08-09 DIAGNOSIS — E785 Hyperlipidemia, unspecified: Secondary | ICD-10-CM | POA: Diagnosis not present

## 2022-08-10 ENCOUNTER — Other Ambulatory Visit: Payer: Self-pay

## 2022-08-10 ENCOUNTER — Encounter: Payer: Self-pay | Admitting: Internal Medicine

## 2022-08-10 ENCOUNTER — Other Ambulatory Visit (HOSPITAL_COMMUNITY): Payer: Self-pay

## 2022-08-10 ENCOUNTER — Ambulatory Visit (INDEPENDENT_AMBULATORY_CARE_PROVIDER_SITE_OTHER): Payer: PPO | Admitting: Internal Medicine

## 2022-08-10 VITALS — BP 104/62 | HR 89 | Temp 98.6°F | Ht 60.75 in | Wt 153.0 lb

## 2022-08-10 DIAGNOSIS — Z0000 Encounter for general adult medical examination without abnormal findings: Secondary | ICD-10-CM | POA: Diagnosis not present

## 2022-08-10 DIAGNOSIS — I10 Essential (primary) hypertension: Secondary | ICD-10-CM | POA: Diagnosis not present

## 2022-08-10 DIAGNOSIS — E785 Hyperlipidemia, unspecified: Secondary | ICD-10-CM | POA: Diagnosis not present

## 2022-08-10 DIAGNOSIS — N1832 Chronic kidney disease, stage 3b: Secondary | ICD-10-CM | POA: Diagnosis not present

## 2022-08-10 DIAGNOSIS — Z634 Disappearance and death of family member: Secondary | ICD-10-CM | POA: Diagnosis not present

## 2022-08-10 DIAGNOSIS — M797 Fibromyalgia: Secondary | ICD-10-CM

## 2022-08-10 DIAGNOSIS — M2550 Pain in unspecified joint: Secondary | ICD-10-CM | POA: Diagnosis not present

## 2022-08-10 DIAGNOSIS — Z853 Personal history of malignant neoplasm of breast: Secondary | ICD-10-CM | POA: Diagnosis not present

## 2022-08-10 DIAGNOSIS — F5101 Primary insomnia: Secondary | ICD-10-CM

## 2022-08-10 DIAGNOSIS — Z6829 Body mass index (BMI) 29.0-29.9, adult: Secondary | ICD-10-CM

## 2022-08-10 DIAGNOSIS — Z1589 Genetic susceptibility to other disease: Secondary | ICD-10-CM | POA: Diagnosis not present

## 2022-08-10 DIAGNOSIS — G8929 Other chronic pain: Secondary | ICD-10-CM

## 2022-08-10 DIAGNOSIS — E1169 Type 2 diabetes mellitus with other specified complication: Secondary | ICD-10-CM

## 2022-08-10 DIAGNOSIS — F3289 Other specified depressive episodes: Secondary | ICD-10-CM

## 2022-08-10 LAB — COMPLETE METABOLIC PANEL WITH GFR
AG Ratio: 1.2 (calc) (ref 1.0–2.5)
ALT: 17 U/L (ref 6–29)
AST: 24 U/L (ref 10–35)
Albumin: 3.5 g/dL — ABNORMAL LOW (ref 3.6–5.1)
Alkaline phosphatase (APISO): 33 U/L — ABNORMAL LOW (ref 37–153)
BUN/Creatinine Ratio: 13 (calc) (ref 6–22)
BUN: 18 mg/dL (ref 7–25)
CO2: 25 mmol/L (ref 20–32)
Calcium: 8.8 mg/dL (ref 8.6–10.4)
Chloride: 106 mmol/L (ref 98–110)
Creat: 1.35 mg/dL — ABNORMAL HIGH (ref 0.50–1.05)
Globulin: 3 g/dL (calc) (ref 1.9–3.7)
Glucose, Bld: 90 mg/dL (ref 65–99)
Potassium: 3.8 mmol/L (ref 3.5–5.3)
Sodium: 141 mmol/L (ref 135–146)
Total Bilirubin: 0.4 mg/dL (ref 0.2–1.2)
Total Protein: 6.5 g/dL (ref 6.1–8.1)
eGFR: 43 mL/min/{1.73_m2} — ABNORMAL LOW (ref >=60)

## 2022-08-10 LAB — CBC WITH DIFFERENTIAL/PLATELET
Absolute Monocytes: 658 cells/uL (ref 200–950)
Basophils Absolute: 39 cells/uL (ref 0–200)
Basophils Relative: 1.1 %
Eosinophils Absolute: 81 cells/uL (ref 15–500)
Eosinophils Relative: 2.3 %
HCT: 34.6 % — ABNORMAL LOW (ref 35.0–45.0)
Hemoglobin: 11.4 g/dL — ABNORMAL LOW (ref 11.7–15.5)
Lymphs Abs: 690 cells/uL — ABNORMAL LOW (ref 850–3900)
MCH: 29.4 pg (ref 27.0–33.0)
MCHC: 32.9 g/dL (ref 32.0–36.0)
MCV: 89.2 fL (ref 80.0–100.0)
MPV: 9.9 fL (ref 7.5–12.5)
Monocytes Relative: 18.8 %
Neutro Abs: 2034 cells/uL (ref 1500–7800)
Neutrophils Relative %: 58.1 %
Platelets: 298 10*3/uL (ref 140–400)
RBC: 3.88 10*6/uL (ref 3.80–5.10)
RDW: 13.8 % (ref 11.0–15.0)
Total Lymphocyte: 19.7 %
WBC: 3.5 10*3/uL — ABNORMAL LOW (ref 3.8–10.8)

## 2022-08-10 LAB — POCT URINALYSIS DIPSTICK
Bilirubin, UA: NEGATIVE
Blood, UA: NEGATIVE
Glucose, UA: NEGATIVE
Ketones, UA: NEGATIVE
Leukocytes, UA: NEGATIVE
Nitrite, UA: NEGATIVE
Protein, UA: NEGATIVE
Spec Grav, UA: 1.01 (ref 1.010–1.025)
Urobilinogen, UA: 0.2 E.U./dL
pH, UA: 6 (ref 5.0–8.0)

## 2022-08-10 LAB — LIPID PANEL
Cholesterol: 127 mg/dL (ref <200)
HDL: 53 mg/dL (ref >=50)
LDL Cholesterol (Calc): 56 mg/dL (calc)
Non-HDL Cholesterol (Calc): 74 mg/dL (calc) (ref <130)
Total CHOL/HDL Ratio: 2.4 (calc) (ref <5.0)
Triglycerides: 99 mg/dL (ref <150)

## 2022-08-10 LAB — HEMOGLOBIN A1C
Hgb A1c MFr Bld: 6 % of total Hgb — ABNORMAL HIGH (ref <5.7)
Mean Plasma Glucose: 126 mg/dL
eAG (mmol/L): 7 mmol/L

## 2022-08-10 LAB — TSH: TSH: 2.4 mIU/L (ref 0.40–4.50)

## 2022-08-10 MED ORDER — HYDROCODONE-ACETAMINOPHEN 10-325 MG PO TABS
1.0000 | ORAL_TABLET | Freq: Three times a day (TID) | ORAL | 0 refills | Status: AC | PRN
  Filled 2022-08-10: qty 15, 5d supply, fill #0

## 2022-08-10 NOTE — Patient Instructions (Signed)
We are sorry to hear about your husband. Please keep follow up with Dr. Saintclair Halsted and [facility]. We have preovided 5 days of Hydrocodone 10/325 for intractable back pain. Continue current meds and follow up in 6 months. Vaccines discussed.

## 2022-08-12 DIAGNOSIS — M5416 Radiculopathy, lumbar region: Secondary | ICD-10-CM | POA: Diagnosis not present

## 2022-08-16 ENCOUNTER — Telehealth: Payer: Self-pay

## 2022-08-16 NOTE — Telephone Encounter (Signed)
Microalbumin was not performed because patient could not void and was asked to return.  I was unaware when performing POC dipstick.

## 2022-08-22 ENCOUNTER — Other Ambulatory Visit: Payer: Self-pay | Admitting: Internal Medicine

## 2022-08-22 MED ORDER — AMLODIPINE BESYLATE 5 MG PO TABS
5.0000 mg | ORAL_TABLET | Freq: Every day | ORAL | 3 refills | Status: DC
  Filled 2022-08-22: qty 90, 90d supply, fill #0
  Filled 2022-12-06: qty 90, 90d supply, fill #1
  Filled 2023-03-07: qty 90, 90d supply, fill #2
  Filled 2023-06-10: qty 90, 90d supply, fill #3

## 2022-08-22 MED ORDER — ROSUVASTATIN CALCIUM 10 MG PO TABS
10.0000 mg | ORAL_TABLET | Freq: Every day | ORAL | 3 refills | Status: DC
  Filled 2022-08-22: qty 90, 90d supply, fill #0
  Filled 2022-12-06: qty 90, 90d supply, fill #1
  Filled 2023-03-07: qty 90, 90d supply, fill #2
  Filled 2023-06-10: qty 90, 90d supply, fill #3

## 2022-08-23 ENCOUNTER — Other Ambulatory Visit (HOSPITAL_COMMUNITY): Payer: Self-pay

## 2022-09-02 ENCOUNTER — Encounter: Payer: Self-pay | Admitting: Radiology

## 2022-09-06 ENCOUNTER — Encounter: Payer: Self-pay | Admitting: Oncology

## 2022-09-07 ENCOUNTER — Other Ambulatory Visit: Payer: Self-pay

## 2022-09-07 ENCOUNTER — Other Ambulatory Visit (HOSPITAL_COMMUNITY): Payer: Self-pay

## 2022-09-07 ENCOUNTER — Other Ambulatory Visit: Payer: Self-pay | Admitting: Oncology

## 2022-09-07 DIAGNOSIS — D649 Anemia, unspecified: Secondary | ICD-10-CM

## 2022-09-07 DIAGNOSIS — E1169 Type 2 diabetes mellitus with other specified complication: Secondary | ICD-10-CM

## 2022-09-07 MED ORDER — FREESTYLE LIBRE 3 READER DEVI
1.0000 | Freq: Every day | 1 refills | Status: DC
  Filled 2022-09-07: qty 1, 30d supply, fill #0

## 2022-09-07 MED ORDER — FREESTYLE LIBRE 3 SENSOR MISC
1.0000 | Freq: Every day | 1 refills | Status: DC
  Filled 2022-09-07: qty 2, 28d supply, fill #0

## 2022-09-07 MED ORDER — FREESTYLE LIBRE 3 SENSOR MISC
1.0000 | Freq: Every day | 1 refills | Status: DC
  Filled 2022-09-07: qty 84, fill #0

## 2022-09-07 NOTE — Progress Notes (Unsigned)
Jill Richardson  [address] [place],  [place]  [address] [phone]  Clinic Day:  09/08/2022  Referring physician: Elby Showers, MD   HISTORY OF PRESENT ILLNESS:  The patient is a 67 y.o. female  who I was asked to consult upon for the continued management of her anemia.   Over the past 2+ years, her hemoglobin has hovered in the 11 range.  She does have mild renal insufficiency, which is thought to be the major reason behind her anemia.  Of note, she is periodically been seen by a nephrologist for her kidney disease.  She denies having any overt forms of blood loss to explain her anemia.  Overall, she denies having any particular changes in her health over these past months.    PAST MEDICAL HISTORY:   Past Medical History:  Diagnosis Date   Breast cancer ([place]) [id number]   Complication of anesthesia    Diabetes mellitus without complication ([place])    Dysrhythmia    hx SVT   Fibromyalgia    GERD (gastroesophageal reflux disease)    Hyperlipidemia    Personal history of radiation therapy    left breast Ca   PONV (postoperative nausea and vomiting)    S/P lumbar fusion 2005   L5-S1   SVT (supraventricular tachycardia)    Umbilical hernia     PAST SURGICAL HISTORY:   Past Surgical History:  Procedure Laterality Date   BREAST LUMPECTOMY Left 2008   malignant   INSERTION OF MESH N/A 03/21/2018   Procedure: INSERTION OF MESH;  Surgeon: Donnie Mesa, MD;  Location: [facility];  Service: General;  Laterality: N/A;  LMA   LUMBAR FUSION  2005   OVARY REMOVED  0000000   UMBILICAL HERNIA REPAIR N/A 03/21/2018   Procedure: HERNIA REPAIR UMBILICAL ADULT WITH POSSIBLE MESH;  Surgeon: Donnie Mesa, MD;  Location: [facility];  Service: General;  Laterality: N/A;  LMA    CURRENT MEDICATIONS:   Current Outpatient Medications  Medication Sig Dispense Refill   Acetaminophen 500 MG capsule 1 capsule as needed Orally every 6 hrs      albuterol (VENTOLIN HFA) 108 (90 Base) MCG/ACT inhaler      amLODipine (NORVASC) 5 MG tablet Take 1 tablet (5 mg total) by mouth daily. 90 tablet 3   blood glucose meter kit and supplies Use to check blood sugar up to 4 times daily as directed 1 each 0   Calcium Carb-Cholecalciferol (CALCIUM 500 + D) 500-5 MG-MCG TABS 1 tablet with meals Orally Twice a day for 30 day(s)     calcium-vitamin D (OSCAL WITH D) 500-200 MG-UNIT per tablet Take 1 tablet by mouth 2 (two) times daily.     chlorpheniramine-HYDROcodone (TUSSIONEX) 10-8 MG/5ML      cyclobenzaprine (FLEXERIL) 10 MG tablet Take 1 tablet (10 mg total) by mouth 3 (three) times daily as needed for muscle spasms. 90 tablet 1   digoxin (LANOXIN) 0.25 MG tablet      doxycycline (VIBRA-TABS) 100 MG tablet      esomeprazole (NEXIUM) 40 MG capsule      fenofibrate 160 MG tablet Take 1 tablet (160 mg total) by mouth daily. 90 tablet 3   ferrous sulfate 325 (65 FE) MG tablet 1 tablet     Glucosamine 500 MG CAPS 1 capsule with a meal Orally Three times a day for 30 day(s)     glucosamine-chondroitin 500-400 MG tablet Take 1 tablet by mouth daily.  HYDROcodone-acetaminophen (NORCO) 10-325 MG tablet      JANUVIA 100 MG tablet Take 1 tablet (100 mg total) by mouth daily. 90 tablet 1   letrozole (FEMARA) 2.5 MG tablet      LORazepam (ATIVAN) 2 MG tablet Take 1 tablet (2 mg total) by mouth at bedtime. 90 tablet 1   losartan (COZAAR) 100 MG tablet Take 1 tablet (100 mg total) by mouth daily. 90 tablet 3   metFORMIN (GLUCOPHAGE) 500 MG tablet Take 1 tablet (500 mg total) by mouth 2 (two) times daily. 180 tablet 3   Multiple Vitamin (MULTIVITAMIN) capsule Take 1 capsule by mouth daily.     oxaprozin (DAYPRO) 600 MG tablet      pantoprazole (PROTONIX) 40 MG tablet Take 1 tablet (40 mg total) by mouth daily. 90 tablet 3   ramipril (ALTACE) 2.5 MG capsule      rosuvastatin (CRESTOR) 10 MG tablet Take 1 tablet (10 mg total) by mouth daily. 90 tablet 3    traMADol (ULTRAM) 50 MG tablet Take 1 tablet by mouth every 6 hours as needed 45 tablet 0   valACYclovir (VALTREX) 500 MG tablet Take 1 tablet (500 mg total) by mouth daily for 5 days 90 tablet 3   venlafaxine XR (EFFEXOR-XR) 75 MG 24 hr capsule Take 1 capsule (75 mg total) by mouth daily with breakfast. 90 capsule 3   No current facility-administered medications for this visit.    ALLERGIES:   Allergies  Allergen Reactions   Atorvastatin Calcium Other (See Comments)   Lipitor  [Atorvastatin] Other (See Comments)   Lodine [Etodolac] Other (See Comments)    STEVENS JOHNSONS     FAMILY HISTORY:   Family History  Problem Relation Age of Onset   Hypertension Mother    Diabetes Father    Hypertension Father        pulmonary HTN   Cancer Father    Kidney disease Father    Diabetes Sister    Hypertension Brother    Breast cancer Neg Hx     SOCIAL HISTORY:  The patient was born and raised in [place].  [place] town.  She is recently widowed; she was previously married for 42 years.  She has 2 children and 2 grandchildren.  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  There is no history of alcohol or tobacco abuse.   REVIEW OF SYSTEMS:  Review of Systems  Constitutional:  Negative for fatigue and fever.  HENT:   Negative for hearing loss and sore throat.   Eyes:  Negative for eye problems.  Respiratory:  Negative for chest tightness, cough and hemoptysis.   Cardiovascular:  Negative for chest pain and palpitations.  Gastrointestinal:  Negative for abdominal distention, abdominal pain, blood in stool, constipation, diarrhea, nausea and vomiting.  Endocrine: Negative for hot flashes.  Genitourinary:  Negative for difficulty urinating, dysuria, frequency, hematuria and nocturia.   Musculoskeletal:  Negative for arthralgias, back pain, gait problem and myalgias.  Skin: Negative.  Negative for itching and rash.  Neurological: Negative.  Negative for dizziness,  extremity weakness, gait problem, headaches, light-headedness and numbness.  Hematological: Negative.   Psychiatric/Behavioral: Negative.  Negative for depression and suicidal ideas. The patient is not nervous/anxious.      PHYSICAL EXAM:  Blood pressure (!) 161/92, pulse 82, temperature 98.9 F (37.2 C), resp. rate 14, height 5' 0.75" (1.543 m), weight 152 lb 8 oz (69.2 kg), SpO2 96 %. Wt Readings from Last 3  Encounters:  09/08/22 152 lb 8 oz (69.2 kg)  08/10/22 153 lb (69.4 kg)  03/11/22 156 lb 1.6 oz (70.8 kg)   Body mass index is 29.05 kg/m. Performance status (ECOG): 0 - Asymptomatic Physical Exam Constitutional:      Appearance: Normal appearance. She is not ill-appearing.  HENT:     Mouth/Throat:     Mouth: Mucous membranes are moist.     Pharynx: Oropharynx is clear. No oropharyngeal exudate or posterior oropharyngeal erythema.  Cardiovascular:     Rate and Rhythm: Normal rate and regular rhythm.     Heart sounds: No murmur heard.    No friction rub. No gallop.  Pulmonary:     Effort: Pulmonary effort is normal. No respiratory distress.     Breath sounds: Normal breath sounds. No wheezing, rhonchi or rales.  Abdominal:     General: Bowel sounds are normal. There is no distension.     Palpations: Abdomen is soft. There is no mass.     Tenderness: There is no abdominal tenderness.  Musculoskeletal:        General: No swelling.     Right lower leg: No edema.     Left lower leg: No edema.  Lymphadenopathy:     Cervical: No cervical adenopathy.     Upper Body:     Right upper body: No supraclavicular or axillary adenopathy.     Left upper body: No supraclavicular or axillary adenopathy.     Lower Body: No right inguinal adenopathy. No left inguinal adenopathy.  Skin:    General: Skin is warm.     Coloration: Skin is not jaundiced.     Findings: No lesion or rash.  Neurological:     General: No focal deficit present.     Mental Status: She is alert and oriented to  person, place, and time. Mental status is at baseline.  Psychiatric:        Mood and Affect: Mood normal.        Behavior: Behavior normal.        Thought Content: Thought content normal.    LABS:      Latest Ref Rng & Units 09/08/2022   12:00 AM 08/09/2022    9:42 AM 03/11/2022   11:58 AM  CBC  WBC  5.7     3.5  4.2   Hemoglobin 12.0 - 16.0 11.5     11.4  11.1   Hematocrit 36 - 46 35     34.6  33.8   Platelets 150 - 400 K/uL 391     298  298      This result is from an external source.      Latest Ref Rng & Units 08/09/2022    9:42 AM 02/01/2022   12:02 PM 09/08/2021   12:00 AM  CMP  Glucose 65 - 99 mg/dL 90  86    BUN 7 - 25 mg/dL 18  24  34      Creatinine 0.50 - 1.05 mg/dL 1.35  1.10  1.4      Sodium 135 - 146 mmol/L 141  143  140      Potassium 3.5 - 5.3 mmol/L 3.8  4.3  4.2      Chloride 98 - 110 mmol/L 106  108  106      CO2 20 - 32 mmol/L '25  27  24      '$ Calcium 8.6 - 10.4 mg/dL 8.8  9.8  9.9  Total Protein 6.1 - 8.1 g/dL 6.5     Total Bilirubin 0.2 - 1.2 mg/dL 0.4     Alkaline Phos 25 - 125   32      AST 10 - 35 U/L 24   32      ALT 6 - 29 U/L 17   25         This result is from an external source.    Latest Reference Range & Units 09/08/22 10:22  Iron 28 - 170 ug/dL 71  UIBC ug/dL 388  TIBC 250 - 450 ug/dL 459 (H)  Saturation Ratios 10.4 - 31.8 % 16  Ferritin 11 - 307 ng/mL 39  Folate >5.9 ng/mL 23.6  (H): Data is abnormally high   ASSESSMENT & PLAN:  A 67 y.o. female who I was asked to consult upon for the continued management of her mild anemia.  It appears mild renal insufficiency is behind her mild anemia.  Her hemoglobin of 11.5 is very similar to what it has been over these past few years.  Her iron parameters today also are normal.  Overall, I do not get the sense an ominous disease process is behind her mild anemia.  As she is clinically doing well, I will see her back in 1 year for repeat clinical assessment.  The patient understands all the  plans discussed today and is in agreement with them.  I do appreciate Baxley, Cresenciano Lick, MD for his new consult.   Finnlee Silvernail Macarthur Critchley, MD

## 2022-09-07 NOTE — Addendum Note (Signed)
Addended by: Geradine Girt D on: 09/07/2022 03:06 PM   Modules accepted: Orders

## 2022-09-08 ENCOUNTER — Ambulatory Visit: Payer: PPO | Admitting: Hematology and Oncology

## 2022-09-08 ENCOUNTER — Other Ambulatory Visit: Payer: Self-pay | Admitting: Oncology

## 2022-09-08 ENCOUNTER — Telehealth: Payer: Self-pay | Admitting: Oncology

## 2022-09-08 ENCOUNTER — Inpatient Hospital Stay (INDEPENDENT_AMBULATORY_CARE_PROVIDER_SITE_OTHER): Payer: PPO | Admitting: Oncology

## 2022-09-08 ENCOUNTER — Other Ambulatory Visit: Payer: PPO

## 2022-09-08 ENCOUNTER — Other Ambulatory Visit (HOSPITAL_COMMUNITY): Payer: Self-pay

## 2022-09-08 ENCOUNTER — Other Ambulatory Visit: Payer: Self-pay

## 2022-09-08 ENCOUNTER — Ambulatory Visit: Payer: PPO | Admitting: Oncology

## 2022-09-08 ENCOUNTER — Inpatient Hospital Stay: Payer: PPO | Attending: Oncology

## 2022-09-08 VITALS — BP 161/92 | HR 82 | Temp 98.9°F | Resp 14 | Ht 60.75 in | Wt 152.5 lb

## 2022-09-08 DIAGNOSIS — D649 Anemia, unspecified: Secondary | ICD-10-CM

## 2022-09-08 DIAGNOSIS — N289 Disorder of kidney and ureter, unspecified: Secondary | ICD-10-CM | POA: Diagnosis not present

## 2022-09-08 DIAGNOSIS — D631 Anemia in chronic kidney disease: Secondary | ICD-10-CM

## 2022-09-08 LAB — CMP (CANCER CENTER ONLY)
ALT: 15 U/L (ref 0–44)
AST: 25 U/L (ref 15–41)
Albumin: 3.8 g/dL (ref 3.5–5.0)
Alkaline Phosphatase: 33 U/L — ABNORMAL LOW (ref 38–126)
Anion gap: 13 (ref 5–15)
BUN: 25 mg/dL — ABNORMAL HIGH (ref 8–23)
CO2: 21 mmol/L — ABNORMAL LOW (ref 22–32)
Calcium: 9.1 mg/dL (ref 8.9–10.3)
Chloride: 105 mmol/L (ref 98–111)
Creatinine: 1.23 mg/dL — ABNORMAL HIGH (ref 0.44–1.00)
GFR, Estimated: 48 mL/min — ABNORMAL LOW (ref >60)
Glucose, Bld: 115 mg/dL — ABNORMAL HIGH (ref 70–99)
Potassium: 3.7 mmol/L (ref 3.5–5.1)
Sodium: 139 mmol/L (ref 135–145)
Total Bilirubin: 0.5 mg/dL (ref 0.3–1.2)
Total Protein: 7.2 g/dL (ref 6.5–8.1)

## 2022-09-08 LAB — IRON AND TIBC
Iron: 71 ug/dL (ref 28–170)
Saturation Ratios: 16 % (ref 10.4–31.8)
TIBC: 459 ug/dL — ABNORMAL HIGH (ref 250–450)
UIBC: 388 ug/dL

## 2022-09-08 LAB — FERRITIN: Ferritin: 39 ng/mL (ref 11–307)

## 2022-09-08 LAB — CBC AND DIFFERENTIAL
HCT: 35 — AB (ref 36–46)
Hemoglobin: 11.5 — AB (ref 12.0–16.0)
Neutrophils Absolute: 3.71
Platelets: 391 10*3/uL (ref 150–400)
WBC: 5.7

## 2022-09-08 LAB — FOLATE: Folate: 23.6 ng/mL (ref >5.9)

## 2022-09-08 LAB — CBC: RBC: 3.82 — AB (ref 3.87–5.11)

## 2022-09-08 NOTE — Telephone Encounter (Signed)
09/08/22 Next appt scheduled and confirmed with patient

## 2022-09-10 ENCOUNTER — Other Ambulatory Visit: Payer: PPO

## 2022-09-10 ENCOUNTER — Ambulatory Visit: Payer: PPO | Admitting: Oncology

## 2022-09-15 ENCOUNTER — Telehealth: Payer: Self-pay

## 2022-09-15 NOTE — Telephone Encounter (Signed)
Patient is aware of Rx contradictions and stated she does not take them together or on a regular basis.

## 2022-10-11 ENCOUNTER — Ambulatory Visit
Admission: RE | Admit: 2022-10-11 | Discharge: 2022-10-11 | Disposition: A | Discharge status: Home / Self Care | Payer: PPO | Source: Ambulatory Visit | Attending: Internal Medicine | Admitting: Internal Medicine

## 2022-10-11 DIAGNOSIS — Z1231 Encounter for screening mammogram for malignant neoplasm of breast: Secondary | ICD-10-CM | POA: Diagnosis not present

## 2022-10-28 ENCOUNTER — Other Ambulatory Visit (HOSPITAL_COMMUNITY): Payer: Self-pay

## 2022-11-03 DIAGNOSIS — J0100 Acute maxillary sinusitis, unspecified: Secondary | ICD-10-CM | POA: Diagnosis not present

## 2022-11-03 DIAGNOSIS — J209 Acute bronchitis, unspecified: Secondary | ICD-10-CM | POA: Diagnosis not present

## 2022-11-05 ENCOUNTER — Other Ambulatory Visit (HOSPITAL_COMMUNITY): Payer: Self-pay

## 2022-12-06 ENCOUNTER — Other Ambulatory Visit: Payer: Self-pay

## 2022-12-07 ENCOUNTER — Other Ambulatory Visit: Payer: Self-pay

## 2022-12-08 ENCOUNTER — Other Ambulatory Visit (HOSPITAL_COMMUNITY): Payer: Self-pay

## 2023-01-20 ENCOUNTER — Ambulatory Visit: Payer: PPO | Admitting: Physician Assistant

## 2023-01-20 ENCOUNTER — Encounter: Payer: Self-pay | Admitting: Physician Assistant

## 2023-01-20 DIAGNOSIS — M7541 Impingement syndrome of right shoulder: Secondary | ICD-10-CM

## 2023-01-20 MED ORDER — METHYLPREDNISOLONE ACETATE 40 MG/ML IJ SUSP
40.0000 mg | INTRAMUSCULAR | Status: AC | PRN
Start: 2023-01-20 — End: 2023-01-20
  Administered 2023-01-20: 40 mg via INTRA_ARTICULAR

## 2023-01-20 MED ORDER — LIDOCAINE HCL 1 % IJ SOLN
3.0000 mL | INTRAMUSCULAR | Status: AC | PRN
Start: 2023-01-20 — End: 2023-01-20
  Administered 2023-01-20: 3 mL

## 2023-01-20 NOTE — Progress Notes (Signed)
HPI: [name] comes in today with right shoulder pain.  She states the last injection in January gave her good relief until just 1 month ago.  She started having shoulder pain and decreased range of motion over the last month.  No known injury.  She denies any real radicular symptoms.  No neck pain.  She notes difficulty with overhead activity.  She has tried Tylenol without any real relief.  She is being seen by Dr. Wynetta Emery here in town and may need lumbar surgery in the near future.  Nondiabetic.  Review of systems: See HPI   Physical exam: General well-developed well-nourished female no acute distress.  Bilateral shoulders: 5 out of 5 strength with external and internal rotation against resistance.  Pincer testing positive on the right negative on the left.  Empty can test is negative bilaterally.  Liftoff test positive on the right negative on the left.  Impression: Right shoulder impingement  Plan: Offered her cortisone injection.  Do feel that at some point in time we need to get an MRI of her right shoulder to rule out rotator cuff tear.  She will continue home exercise program for her shoulder as shown.  Once she has dealt with her lumbar spine and possible surgery we will obtain an MRI of her shoulder.  Questions were encouraged and answered.     Procedure Note  Patient: Jill Richardson             Date of Birth: [birth year]           MRN: [id number]             Visit Date: 01/20/2023  Procedures: Visit Diagnoses:  1. Impingement syndrome of right shoulder     Large Joint Inj: R subacromial bursa on 01/20/2023 4:22 PM Indications: pain Details: 22 G 1.5 in needle, superior approach  Arthrogram: No  Medications: 3 mL lidocaine 1 %; 40 mg methylPREDNISolone acetate 40 MG/ML Outcome: tolerated well, no immediate complications Procedure, treatment alternatives, risks and benefits explained, specific risks discussed. Consent was given by the patient. Immediately prior to procedure a  time out was called to verify the correct patient, procedure, equipment, support staff and site/side marked as required. Patient was prepped and draped in the usual sterile fashion.

## 2023-01-25 ENCOUNTER — Other Ambulatory Visit (HOSPITAL_COMMUNITY): Payer: Self-pay

## 2023-01-25 ENCOUNTER — Other Ambulatory Visit: Payer: Self-pay

## 2023-01-25 ENCOUNTER — Other Ambulatory Visit: Payer: Self-pay | Admitting: Internal Medicine

## 2023-01-25 MED ORDER — FENOFIBRATE 160 MG PO TABS
160.0000 mg | ORAL_TABLET | Freq: Every day | ORAL | 3 refills | Status: DC
  Filled 2023-01-25: qty 90, 90d supply, fill #0
  Filled 2023-04-25: qty 90, 90d supply, fill #1
  Filled 2023-08-03: qty 90, 90d supply, fill #2
  Filled 2023-10-28: qty 90, 90d supply, fill #3

## 2023-02-01 NOTE — Progress Notes (Addendum)
Patient Care Team: [name], MD as PCP - General (Internal Medicine) Miguel Aschoff, MD (Inactive) (Obstetrics and Gynecology) Pierce Crane, MD (Inactive) (Hematology and Oncology) Margaretmary Dys, MD (Radiation Oncology) Cyndia Bent, MD (Inactive) (General Surgery)  Visit Date: 02/15/23  Subjective:    Patient ID: Jill Richardson , Female   DOB: Oct 15, 1955, 67 y.o.    MRN: [id number]   67 y.o. Female presents today for a 6 month follow-up. History of Type 2 diabetes mellitus, fibromyalgia, dysrhythmia, GERD, hyperlipidemia, lumbar fusion, SVT, umbilical hernia.  History of back pain status post lumbar fusion. She has been having worsening back pain and plans to get spinal surgery. Has some Tramadol but would like another pain medication. Pain is worse when walking.  History of hypertension treated with amlodipine 5 mg daily, losartan 100 mg daily. Blood pressure elevated at 120/90 today.  History of Type 2 diabetes mellitus treated with metformin 500 mg twice daily, Januvia 100 mg daily. HGBA1c at 5.9% on 02/10/23, down from 6% on 08/09/22. She needs a refill on blood glucose meter supplies.  Reports she is UTD on her eye exam and is scheduled for an eye exam in 10/24.  History of GERD treated with pantoprazole 40 mg daily.  History of hyperlipidemia treated with rosuvastatin 10 mg daily. Lipid panel normal.  History of depression treated with Effexor-XR 75 mg daily.  History of insomnia treated with lorazepam 2 mg at bedtime.  History of fibromyalgia treated with Flexeril 10 mg three times daily as needed, Ultram 50 mg once every 6 hours as needed.   History of chronic kidney disease stage 3. Urine creatinine, microalbumin normal.  Alkaline phosphatase low at 32.   Denies leg swelling.  Past Medical History:  Diagnosis Date   Breast cancer (HCC) 2008   Complication of anesthesia    Diabetes mellitus without complication (HCC)    Dysrhythmia    hx SVT    Fibromyalgia    GERD (gastroesophageal reflux disease)    Hyperlipidemia    Personal history of radiation therapy    left breast Ca   PONV (postoperative nausea and vomiting)    S/P lumbar fusion 2005   L5-S1   SVT (supraventricular tachycardia)    Umbilical hernia      Family History  Problem Relation Age of Onset   Hypertension Mother    Diabetes Father    Hypertension Father        pulmonary HTN   Cancer Father    Kidney disease Father    Diabetes Sister    Hypertension Brother    Breast cancer Neg Hx     Social History   Social History Narrative   Social history: She is married and has grandchildren.  Husband is disabled due to a chronic brain injury after an accident in the mid 38s.  2 adult sons.       Family history: Father with history of diabetes and hypertension died from complications of heart failure.  Mother still living.  Brother with history of hypertension.  Sister in good health.      Review of Systems  Constitutional:  Negative for fever and malaise/fatigue.  HENT:  Negative for congestion.   Eyes:  Negative for blurred vision.  Respiratory:  Negative for cough and shortness of breath.   Cardiovascular:  Negative for chest pain, palpitations and leg swelling.  Gastrointestinal:  Negative for vomiting.  Musculoskeletal:  Positive for back pain.  Skin:  Negative for rash.  Neurological:  Negative for loss of consciousness and headaches.        Objective:   Vitals: BP (!) 120/90   Pulse 87   Ht 5' 0.75" (1.543 m)   Wt 155 lb (70.3 kg)   SpO2 97%   BMI 29.53 kg/m    Physical Exam Vitals and nursing note reviewed.  Constitutional:      General: She is not in acute distress.    Appearance: Normal appearance. She is not toxic-appearing.  HENT:     Head: Normocephalic and atraumatic.  Cardiovascular:     Rate and Rhythm: Normal rate and regular rhythm. No extrasystoles are present.    Pulses: Normal pulses.     Heart sounds: Normal  heart sounds. No murmur heard.    No friction rub. No gallop.  Pulmonary:     Effort: Pulmonary effort is normal. No respiratory distress.     Breath sounds: Normal breath sounds. No wheezing or rales.  Lymphadenopathy:     Cervical: No cervical adenopathy.  Skin:    General: Skin is warm and dry.  Neurological:     Mental Status: She is alert and oriented to person, place, and time. Mental status is at baseline.  Psychiatric:        Mood and Affect: Mood normal.        Behavior: Behavior normal.        Thought Content: Thought content normal.        Judgment: Judgment normal.       Results:   Studies obtained and personally reviewed by me:   Labs:       Component Value Date/Time   NA 139 09/08/2022 1022   NA 140 09/08/2021 0000   NA 144 05/14/2014 0822   K 3.7 09/08/2022 1022   K 4.3 05/14/2014 0822   CL 105 09/08/2022 1022   CO2 21 (L) 09/08/2022 1022   CO2 26 05/14/2014 0822   GLUCOSE 115 (H) 09/08/2022 1022   GLUCOSE 147 (H) 05/14/2014 0822   BUN 25 (H) 09/08/2022 1022   BUN 34 (A) 09/08/2021 0000   BUN 23.3 05/14/2014 0822   CREATININE 1.23 (H) 09/08/2022 1022   CREATININE 1.35 (H) 08/09/2022 0942   CREATININE 1.0 05/14/2014 0822   CALCIUM 9.1 09/08/2022 1022   CALCIUM 9.9 05/14/2014 0822   PROT 6.7 02/10/2023 0907   PROT 6.8 05/14/2014 0822   ALBUMIN 3.8 09/08/2022 1022   ALBUMIN 3.4 (L) 05/14/2014 0822   AST 20 02/10/2023 0907   AST 25 09/08/2022 1022   AST 23 05/14/2014 0822   ALT 13 02/10/2023 0907   ALT 15 09/08/2022 1022   ALT 28 05/14/2014 0822   ALKPHOS 33 (L) 09/08/2022 1022   ALKPHOS 28 (L) 05/14/2014 0822   BILITOT 0.6 02/10/2023 0907   BILITOT 0.5 09/08/2022 1022   BILITOT 0.34 05/14/2014 0822   GFRNONAA 48 (L) 09/08/2022 1022   GFRNONAA 55 (L) 07/31/2020 1118   GFRAA 63 07/31/2020 1118     Lab Results  Component Value Date   WBC 5.7 09/08/2022   HGB 11.5 (A) 09/08/2022   HCT 35 (A) 09/08/2022   MCV 89.2 08/09/2022   PLT 391  09/08/2022    Lab Results  Component Value Date   CHOL 150 02/10/2023   HDL 74 02/10/2023   LDLCALC 61 02/10/2023   TRIG 72 02/10/2023   CHOLHDL 2.0 02/10/2023    Lab Results  Component Value Date   HGBA1C 5.9 (H) 02/10/2023  Lab Results  Component Value Date   TSH 2.40 08/09/2022      Assessment & Plan:   Back Pain: she is planning to have another spinal surgery. Prescribed Norco 10-325 mg every six hours as needed #15 tabs to take one half to one tab sparingly as needed every 6 hours. She seldom takes this med except when having intolerable pain.  Hypertension: treated with amlodipine 5 mg daily, losartan 100 mg daily. Blood pressure elevated at 120/90 today. Refilled losartan.  Type 2 diabetes mellitus: treated with metformin 500 mg twice daily, Januvia 100 mg daily. HGBA1c at 5.9% on 02/10/23, down from 6% on 08/09/22.  Per patient request,we will look into getting her a DexCom device.Order placed with pharmacy. Not sure if insurance will cover this.  GERD: treated with pantoprazole 40 mg daily.  Hyperlipidemia: treated with rosuvastatin 10 mg daily. Lipid panel normal.  Depression: stable with Effexor-XR 75 mg daily.  Insomnia: treated with lorazepam 2 mg at bedtime. Refilled.  Fibromyalgia: treated with Flexeril 10 mg three times daily as needed, Ultram 50 mg once every 6 hours as needed. Refilled Flexeril.   Chronic kidney disease stage 3b: urine creatinine, microalbumin normal.  Health maintenance: vaccines discussed and to be considered. Colonoscopy due 2028.  Vaccine Counseling: advised to receive flu, Covid-19 updates in the Fall.  Return in 6 months for health maintenance exam and fasting labs or as needed.    I,Alexander Ruley,acting as a Neurosurgeon for [name], MD.,have documented all relevant documentation on the behalf of [name], MD,as directed by  [name], MD while in the presence of [name], MD.   I, [name], MD, have  reviewed all documentation for this visit. The documentation on 02/26/23 for the exam, diagnosis, procedures, and orders are all accurate and complete.

## 2023-02-07 ENCOUNTER — Other Ambulatory Visit: Payer: Self-pay | Admitting: Internal Medicine

## 2023-02-08 ENCOUNTER — Other Ambulatory Visit (HOSPITAL_COMMUNITY): Payer: Self-pay

## 2023-02-08 ENCOUNTER — Other Ambulatory Visit: Payer: Self-pay

## 2023-02-08 MED ORDER — VENLAFAXINE HCL ER 75 MG PO CP24
75.0000 mg | ORAL_CAPSULE | Freq: Every day | ORAL | 3 refills | Status: DC
  Filled 2023-02-08: qty 90, 90d supply, fill #0
  Filled 2023-05-04: qty 90, 90d supply, fill #1
  Filled 2023-08-03: qty 90, 90d supply, fill #2
  Filled 2023-10-31: qty 90, 90d supply, fill #3

## 2023-02-08 MED ORDER — JANUVIA 100 MG PO TABS
100.0000 mg | ORAL_TABLET | Freq: Every day | ORAL | 1 refills | Status: DC
  Filled 2023-02-08: qty 90, 90d supply, fill #0
  Filled 2023-08-03: qty 90, 90d supply, fill #1

## 2023-02-10 ENCOUNTER — Other Ambulatory Visit: Payer: PPO

## 2023-02-10 DIAGNOSIS — E119 Type 2 diabetes mellitus without complications: Secondary | ICD-10-CM | POA: Diagnosis not present

## 2023-02-10 DIAGNOSIS — M4120 Other idiopathic scoliosis, site unspecified: Secondary | ICD-10-CM | POA: Diagnosis not present

## 2023-02-10 DIAGNOSIS — M4316 Spondylolisthesis, lumbar region: Secondary | ICD-10-CM | POA: Diagnosis not present

## 2023-02-10 DIAGNOSIS — E1169 Type 2 diabetes mellitus with other specified complication: Secondary | ICD-10-CM

## 2023-02-10 DIAGNOSIS — E785 Hyperlipidemia, unspecified: Secondary | ICD-10-CM | POA: Diagnosis not present

## 2023-02-11 LAB — HEMOGLOBIN A1C
Hgb A1c MFr Bld: 5.9 %{Hb} — ABNORMAL HIGH (ref <5.7)
Mean Plasma Glucose: 123 mg/dL
eAG (mmol/L): 6.8 mmol/L

## 2023-02-11 LAB — MICROALBUMIN / CREATININE URINE RATIO
Creatinine, Urine: 176 mg/dL (ref 20–275)
Microalb Creat Ratio: 2 mg/g{creat} (ref <30)
Microalb, Ur: 0.4 mg/dL

## 2023-02-11 LAB — LIPID PANEL
Cholesterol: 150 mg/dL (ref <200)
HDL: 74 mg/dL (ref >=50)
LDL Cholesterol (Calc): 61 mg/dL
Non-HDL Cholesterol (Calc): 76 mg/dL (ref <130)
Total CHOL/HDL Ratio: 2 (calc) (ref <5.0)
Triglycerides: 72 mg/dL (ref <150)

## 2023-02-11 LAB — HEPATIC FUNCTION PANEL
AG Ratio: 1.4 (calc) (ref 1.0–2.5)
ALT: 13 U/L (ref 6–29)
AST: 20 U/L (ref 10–35)
Albumin: 3.9 g/dL (ref 3.6–5.1)
Alkaline phosphatase (APISO): 32 U/L — ABNORMAL LOW (ref 37–153)
Bilirubin, Direct: 0.1 mg/dL (ref 0.0–0.2)
Globulin: 2.8 g/dL (ref 1.9–3.7)
Indirect Bilirubin: 0.5 mg/dL (ref 0.2–1.2)
Total Bilirubin: 0.6 mg/dL (ref 0.2–1.2)
Total Protein: 6.7 g/dL (ref 6.1–8.1)

## 2023-02-15 ENCOUNTER — Ambulatory Visit (INDEPENDENT_AMBULATORY_CARE_PROVIDER_SITE_OTHER): Payer: PPO | Admitting: Internal Medicine

## 2023-02-15 ENCOUNTER — Encounter: Payer: Self-pay | Admitting: Internal Medicine

## 2023-02-15 ENCOUNTER — Other Ambulatory Visit (HOSPITAL_COMMUNITY): Payer: Self-pay

## 2023-02-15 ENCOUNTER — Other Ambulatory Visit: Payer: Self-pay

## 2023-02-15 VITALS — BP 120/90 | HR 87 | Ht 60.75 in | Wt 155.0 lb

## 2023-02-15 DIAGNOSIS — E1169 Type 2 diabetes mellitus with other specified complication: Secondary | ICD-10-CM | POA: Diagnosis not present

## 2023-02-15 DIAGNOSIS — M797 Fibromyalgia: Secondary | ICD-10-CM

## 2023-02-15 DIAGNOSIS — Z853 Personal history of malignant neoplasm of breast: Secondary | ICD-10-CM

## 2023-02-15 DIAGNOSIS — N1832 Chronic kidney disease, stage 3b: Secondary | ICD-10-CM

## 2023-02-15 DIAGNOSIS — I10 Essential (primary) hypertension: Secondary | ICD-10-CM | POA: Diagnosis not present

## 2023-02-15 DIAGNOSIS — G8929 Other chronic pain: Secondary | ICD-10-CM

## 2023-02-15 DIAGNOSIS — F5101 Primary insomnia: Secondary | ICD-10-CM | POA: Diagnosis not present

## 2023-02-15 DIAGNOSIS — F3289 Other specified depressive episodes: Secondary | ICD-10-CM | POA: Diagnosis not present

## 2023-02-15 DIAGNOSIS — E785 Hyperlipidemia, unspecified: Secondary | ICD-10-CM

## 2023-02-15 MED ORDER — LORAZEPAM 2 MG PO TABS
2.0000 mg | ORAL_TABLET | Freq: Every day | ORAL | 1 refills | Status: DC
  Filled 2023-02-15: qty 90, 90d supply, fill #0

## 2023-02-15 MED ORDER — HYDROCODONE-ACETAMINOPHEN 10-325 MG PO TABS
1.0000 | ORAL_TABLET | Freq: Four times a day (QID) | ORAL | 0 refills | Status: DC | PRN
  Filled 2023-02-15: qty 15, 4d supply, fill #0

## 2023-02-15 MED ORDER — DEXCOM G7 SENSOR MISC
3 refills | Status: DC
  Filled 2023-02-15: qty 3, 30d supply, fill #0

## 2023-02-15 MED ORDER — VALACYCLOVIR HCL 500 MG PO TABS
500.0000 mg | ORAL_TABLET | Freq: Every day | ORAL | 3 refills | Status: DC
  Filled 2023-02-15 – 2023-03-07 (×2): qty 90, 90d supply, fill #0
  Filled 2023-06-10: qty 90, 90d supply, fill #1
  Filled 2023-09-18: qty 90, 90d supply, fill #2
  Filled 2023-12-14: qty 90, 90d supply, fill #3

## 2023-02-15 MED ORDER — CYCLOBENZAPRINE HCL 10 MG PO TABS
10.0000 mg | ORAL_TABLET | Freq: Three times a day (TID) | ORAL | 1 refills | Status: DC | PRN
  Filled 2023-02-15: qty 90, 30d supply, fill #0

## 2023-02-15 MED ORDER — LOSARTAN POTASSIUM 100 MG PO TABS
100.0000 mg | ORAL_TABLET | Freq: Every day | ORAL | 3 refills | Status: DC
  Filled 2023-02-15 – 2023-04-25 (×2): qty 90, 90d supply, fill #0
  Filled 2023-08-03: qty 90, 90d supply, fill #1
  Filled 2023-10-28: qty 90, 90d supply, fill #2
  Filled 2024-01-30: qty 90, 90d supply, fill #3

## 2023-02-15 MED ORDER — DEXCOM G7 RECEIVER DEVI
3 refills | Status: DC
  Filled 2023-02-15: qty 3, 30d supply, fill #0

## 2023-02-15 NOTE — Patient Instructions (Addendum)
It was a pleasure to see today. Dexcom glucose monitor order placed. Have given 5 days of Narcotic pain med for chronic recurrent back pain. Patient considering back surgery. No changes in meds. Labs are stable.

## 2023-02-16 ENCOUNTER — Other Ambulatory Visit: Payer: Self-pay

## 2023-02-21 ENCOUNTER — Other Ambulatory Visit: Payer: Self-pay | Admitting: Neurosurgery

## 2023-02-26 ENCOUNTER — Encounter: Payer: Self-pay | Admitting: Internal Medicine

## 2023-03-07 ENCOUNTER — Other Ambulatory Visit (HOSPITAL_COMMUNITY): Payer: Self-pay

## 2023-03-07 NOTE — Pre-Procedure Instructions (Signed)
Surgical Instructions   Your procedure is scheduled on March 16, 2023. Report to [facility] Main Entrance "A" at 6:30 A.M., then check in with the Admitting office. Any questions or running late day of surgery: call [phone]  Questions prior to your surgery date: call [phone], Monday-Friday, 8am-4pm. If you experience any cold or flu symptoms such as cough, fever, chills, shortness of breath, etc. between now and your scheduled surgery, please notify us at the above number.     Remember:  Do not eat or drink after midnight the night before your surgery    Take these medicines the morning of surgery with A SIP OF WATER: amLODipine (NORVASC)  fenofibrate  pantoprazole (PROTONIX)  rosuvastatin (CRESTOR)  valACYclovir (VALTREX)  venlafaxine XR [facility])    May take these medicines IF NEEDED: cyclobenzaprine (FLEXERIL)  HYDROcodone-acetaminophen (NORCO)  LORazepam (ATIVAN)  traMADol (ULTRAM)    One week prior to surgery, STOP taking any Aspirin (unless otherwise instructed by your surgeon) Aleve, Naproxen, Ibuprofen, Motrin, Advil, Goody's, BC's, all herbal medications, fish oil, and non-prescription vitamins.   WHAT DO I DO ABOUT MY DIABETES MEDICATION?   Do not take JANUVIA or metFORMIN (GLUCOPHAGE) the morning of surgery.   HOW TO MANAGE YOUR DIABETES BEFORE AND AFTER SURGERY  Why is it important to control my blood sugar before and after surgery? Improving blood sugar levels before and after surgery helps healing and can limit problems. A way of improving blood sugar control is eating a healthy diet by:  Eating less sugar and carbohydrates  Increasing activity/exercise  Talking with your doctor about reaching your blood sugar goals High blood sugars (greater than 180 mg/dL) can raise your risk of infections and slow your recovery, so you will need to focus on controlling your diabetes during the weeks before surgery. Make sure that the doctor who takes  care of your diabetes knows about your planned surgery including the date and location.  How do I manage my blood sugar before surgery? Check your blood sugar at least 4 times a day, starting 2 days before surgery, to make sure that the level is not too high or low.  Check your blood sugar the morning of your surgery when you wake up and every 2 hours until you get to the Short Stay unit.  If your blood sugar is less than 70 mg/dL, you will need to treat for low blood sugar: Do not take insulin. Treat a low blood sugar (less than 70 mg/dL) with  cup of clear juice (cranberry or apple), 4 glucose tablets, OR glucose gel. Recheck blood sugar in 15 minutes after treatment (to make sure it is greater than 70 mg/dL). If your blood sugar is not greater than 70 mg/dL on recheck, call [phone] for further instructions. Report your blood sugar to the short stay nurse when you get to Short Stay.  If you are admitted to the hospital after surgery: Your blood sugar will be checked by the staff and you will probably be given insulin after surgery (instead of oral diabetes medicines) to make sure you have good blood sugar levels. The goal for blood sugar control after surgery is 80-180 mg/dL.                      Do NOT Smoke (Tobacco/Vaping) for 24 hours prior to your procedure.  If you use a CPAP at night, you may bring your mask/headgear for your overnight stay.   You will be asked to  remove any contacts, glasses, piercing's, hearing aid's, dentures/partials prior to surgery. Please bring cases for these items if needed.    Patients discharged the day of surgery will not be allowed to drive home, and someone needs to stay with them for 24 hours.  SURGICAL WAITING ROOM VISITATION Patients may have no more than 2 support people in the waiting area - these visitors may rotate.   Pre-op nurse will coordinate an appropriate time for 1 ADULT support person, who may not rotate, to accompany patient  in pre-op.  Children under the age of 63 must have an adult with them who is not the patient and must remain in the main waiting area with an adult.  If the patient needs to stay at the hospital during part of their recovery, the visitor guidelines for inpatient rooms apply.  Please refer to the [facility] website for the visitor guidelines for any additional information.   If you received a COVID test during your pre-op visit  it is requested that you wear a mask when out in public, stay away from anyone that may not be feeling well and notify your surgeon if you develop symptoms. If you have been in contact with anyone that has tested positive in the last 10 days please notify you surgeon.      Pre-operative 5 CHG Bathing Instructions   You can play a key role in reducing the risk of infection after surgery. Your skin needs to be as free of germs as possible. You can reduce the number of germs on your skin by washing with CHG (chlorhexidine gluconate) soap before surgery. CHG is an antiseptic soap that kills germs and continues to kill germs even after washing.   DO NOT use if you have an allergy to chlorhexidine/CHG or antibacterial soaps. If your skin becomes reddened or irritated, stop using the CHG and notify one of our RNs at [phone].   Please shower with the CHG soap starting 4 days before surgery using the following schedule:     Please keep in mind the following:  DO NOT shave, including legs and underarms, starting the day of your first shower.   You may shave your face at any point before/day of surgery.  Place clean sheets on your bed the day you start using CHG soap. Use a clean washcloth (not used since being washed) for each shower. DO NOT sleep with pets once you start using the CHG.   CHG Shower Instructions:  If you choose to wash your hair and private area, wash first with your normal shampoo/soap.  After you use shampoo/soap, rinse your hair and body  thoroughly to remove shampoo/soap residue.  Turn the water OFF and apply about 3 tablespoons (45 ml) of CHG soap to a CLEAN washcloth.  Apply CHG soap ONLY FROM YOUR NECK DOWN TO YOUR TOES (washing for 3-5 minutes)  DO NOT use CHG soap on face, private areas, open wounds, or sores.  Pay special attention to the area where your surgery is being performed.  If you are having back surgery, having someone wash your back for you may be helpful. Wait 2 minutes after CHG soap is applied, then you may rinse off the CHG soap.  Pat dry with a clean towel  Put on clean clothes/pajamas   If you choose to wear lotion, please use ONLY the CHG-compatible lotions on the back of this paper.   Additional instructions for the day of surgery: DO NOT APPLY any lotions,  deodorants, cologne, or perfumes.   Do not bring valuables to the hospital. [facility] is not responsible for any belongings/valuables. Do not wear nail polish, gel polish, artificial nails, or any other type of covering on natural nails (fingers and toes) Do not wear jewelry or makeup Put on clean/comfortable clothes.  Please brush your teeth.  Ask your nurse before applying any prescription medications to the skin.     CHG Compatible Lotions   Aveeno Moisturizing lotion  Cetaphil Moisturizing Cream  Cetaphil Moisturizing Lotion  Clairol Herbal Essence Moisturizing Lotion, Dry Skin  Clairol Herbal Essence Moisturizing Lotion, Extra Dry Skin  Clairol Herbal Essence Moisturizing Lotion, Normal Skin  Curel Age Defying Therapeutic Moisturizing Lotion with Alpha Hydroxy  Curel Extreme Care Body Lotion  Curel Soothing Hands Moisturizing Hand Lotion  Curel Therapeutic Moisturizing Cream, Fragrance-Free  Curel Therapeutic Moisturizing Lotion, Fragrance-Free  Curel Therapeutic Moisturizing Lotion, Original Formula  Eucerin Daily Replenishing Lotion  Eucerin Dry Skin Therapy Plus Alpha Hydroxy Crme  Eucerin Dry Skin Therapy Plus Alpha  Hydroxy Lotion  Eucerin Original Crme  Eucerin Original Lotion  Eucerin Plus Crme Eucerin Plus Lotion  Eucerin TriLipid Replenishing Lotion  Keri Anti-Bacterial Hand Lotion  Keri Deep Conditioning Original Lotion Dry Skin Formula Softly Scented  Keri Deep Conditioning Original Lotion, Fragrance Free Sensitive Skin Formula  Keri Lotion Fast Absorbing Fragrance Free Sensitive Skin Formula  Keri Lotion Fast Absorbing Softly Scented Dry Skin Formula  Keri Original Lotion  Keri Skin Renewal Lotion Keri Silky Smooth Lotion  Keri Silky Smooth Sensitive Skin Lotion  Nivea Body Creamy Conditioning Oil  Nivea Body Extra Enriched Lotion  Nivea Body Original Lotion  Nivea Body Sheer Moisturizing Lotion Nivea Crme  Nivea Skin Firming Lotion  NutraDerm 30 Skin Lotion  NutraDerm Skin Lotion  NutraDerm Therapeutic Skin Cream  NutraDerm Therapeutic Skin Lotion  ProShield Protective Hand Cream  Provon moisturizing lotion  Please read over the following fact sheets that you were given.

## 2023-03-08 ENCOUNTER — Encounter (HOSPITAL_COMMUNITY)
Admission: RE | Admit: 2023-03-08 | Discharge: 2023-03-08 | Disposition: A | Discharge status: Home / Self Care | Payer: PPO | Source: Ambulatory Visit | Attending: Neurosurgery

## 2023-03-08 ENCOUNTER — Other Ambulatory Visit: Payer: Self-pay

## 2023-03-08 ENCOUNTER — Encounter (HOSPITAL_COMMUNITY): Payer: Self-pay

## 2023-03-08 VITALS — BP 139/91 | HR 84 | Temp 98.2°F | Resp 17 | Ht 60.0 in | Wt 160.0 lb

## 2023-03-08 DIAGNOSIS — Z01812 Encounter for preprocedural laboratory examination: Secondary | ICD-10-CM | POA: Diagnosis not present

## 2023-03-08 DIAGNOSIS — Z01818 Encounter for other preprocedural examination: Secondary | ICD-10-CM | POA: Diagnosis present

## 2023-03-08 DIAGNOSIS — E119 Type 2 diabetes mellitus without complications: Secondary | ICD-10-CM | POA: Diagnosis not present

## 2023-03-08 DIAGNOSIS — Z01810 Encounter for preprocedural cardiovascular examination: Secondary | ICD-10-CM | POA: Insufficient documentation

## 2023-03-08 HISTORY — DX: Essential (primary) hypertension: I10

## 2023-03-08 HISTORY — DX: Chronic kidney disease, unspecified: N18.9

## 2023-03-08 HISTORY — DX: Anemia, unspecified: D64.9

## 2023-03-08 LAB — CBC
HCT: 36.4 % (ref 36.0–46.0)
Hemoglobin: 11.6 g/dL — ABNORMAL LOW (ref 12.0–15.0)
MCH: 29.6 pg (ref 26.0–34.0)
MCHC: 31.9 g/dL (ref 30.0–36.0)
MCV: 92.9 fL (ref 80.0–100.0)
Platelets: 331 10*3/uL (ref 150–400)
RBC: 3.92 MIL/uL (ref 3.87–5.11)
RDW: 15.7 % — ABNORMAL HIGH (ref 11.5–15.5)
WBC: 7.1 10*3/uL (ref 4.0–10.5)
nRBC: 0 % (ref 0.0–0.2)

## 2023-03-08 LAB — BASIC METABOLIC PANEL
Anion gap: 8 (ref 5–15)
BUN: 30 mg/dL — ABNORMAL HIGH (ref 8–23)
CO2: 23 mmol/L (ref 22–32)
Calcium: 9.2 mg/dL (ref 8.9–10.3)
Chloride: 104 mmol/L (ref 98–111)
Creatinine, Ser: 1.28 mg/dL — ABNORMAL HIGH (ref 0.44–1.00)
GFR, Estimated: 45 mL/min — ABNORMAL LOW (ref >60)
Glucose, Bld: 80 mg/dL (ref 70–99)
Potassium: 4.2 mmol/L (ref 3.5–5.1)
Sodium: 135 mmol/L (ref 135–145)

## 2023-03-08 LAB — TYPE AND SCREEN
ABO/RH(D): A POS
Antibody Screen: NEGATIVE

## 2023-03-08 LAB — SURGICAL PCR SCREEN
MRSA, PCR: NEGATIVE
Staphylococcus aureus: NEGATIVE

## 2023-03-08 LAB — GLUCOSE, CAPILLARY: Glucose-Capillary: 78 mg/dL (ref 70–99)

## 2023-03-08 NOTE — Progress Notes (Signed)
PCP - Dr. Marlan Palau Cardiologist - Per pt, had one 20+ years ago but has not needed to see anyone since then.  Nephrologist - Dr. Louie Bun  PPM/ICD - Denies Device Orders - n/a Rep Notified - n/a  Chest x-ray - Denies EKG - 03/08/2023 Stress Test - Denies ECHO - Denies Cardiac Cath - Denies  Sleep Study - Denies CPAP - n/a  Pt is DM2. Pt checks her blood sugar 2-3x/week. Normal fasting blood sugar is 80-90. CBG at pre-op 78. Last A1c was 5.9 on 02/10/2023. Pt has Dexcom ordered, but has not received it yet.  Last dose of GLP1 agonist- n/a GLP1 instructions: n/a  Blood Thinner Instructions: n/a Aspirin Instructions: n/a  NPO after midnight  COVID TEST- n/a   Anesthesia review: Yes. Hx of CKD, DM, HTN, SVT (no medications. Managed by PCP)  Patient denies shortness of breath, fever, cough and chest pain at PAT appointment. Pt denies any respiratory illness/infection in the last two months.   All instructions explained to the patient, with a verbal understanding of the material. Patient agrees to go over the instructions while at home for a better understanding. Patient also instructed to self quarantine after being tested for COVID-19. The opportunity to ask questions was provided.

## 2023-03-14 DIAGNOSIS — M4316 Spondylolisthesis, lumbar region: Secondary | ICD-10-CM | POA: Diagnosis not present

## 2023-03-16 ENCOUNTER — Inpatient Hospital Stay (HOSPITAL_COMMUNITY): Payer: PPO

## 2023-03-16 ENCOUNTER — Inpatient Hospital Stay (HOSPITAL_COMMUNITY)
Admission: RE | Admit: 2023-03-16 | Discharge: 2023-03-18 | DRG: 454 | Disposition: A | Discharge status: Home / Self Care | Payer: PPO | Attending: Neurosurgery | Admitting: Neurosurgery

## 2023-03-16 ENCOUNTER — Encounter (HOSPITAL_COMMUNITY)
Admission: RE | Disposition: A | Discharge status: Home / Self Care | Payer: Self-pay | Source: Home / Self Care | Attending: Neurosurgery

## 2023-03-16 ENCOUNTER — Inpatient Hospital Stay (HOSPITAL_COMMUNITY): Payer: PPO | Admitting: Physician Assistant

## 2023-03-16 ENCOUNTER — Other Ambulatory Visit: Payer: Self-pay

## 2023-03-16 ENCOUNTER — Encounter (HOSPITAL_COMMUNITY): Payer: Self-pay | Admitting: Neurosurgery

## 2023-03-16 DIAGNOSIS — I10 Essential (primary) hypertension: Secondary | ICD-10-CM | POA: Diagnosis present

## 2023-03-16 DIAGNOSIS — Z833 Family history of diabetes mellitus: Secondary | ICD-10-CM | POA: Diagnosis not present

## 2023-03-16 DIAGNOSIS — Z888 Allergy status to other drugs, medicaments and biological substances status: Secondary | ICD-10-CM | POA: Diagnosis not present

## 2023-03-16 DIAGNOSIS — Z841 Family history of disorders of kidney and ureter: Secondary | ICD-10-CM | POA: Diagnosis not present

## 2023-03-16 DIAGNOSIS — E119 Type 2 diabetes mellitus without complications: Secondary | ICD-10-CM | POA: Diagnosis present

## 2023-03-16 DIAGNOSIS — Z981 Arthrodesis status: Secondary | ICD-10-CM | POA: Diagnosis not present

## 2023-03-16 DIAGNOSIS — M960 Pseudarthrosis after fusion or arthrodesis: Secondary | ICD-10-CM | POA: Diagnosis present

## 2023-03-16 DIAGNOSIS — Z7984 Long term (current) use of oral hypoglycemic drugs: Secondary | ICD-10-CM | POA: Diagnosis not present

## 2023-03-16 DIAGNOSIS — Z9889 Other specified postprocedural states: Secondary | ICD-10-CM | POA: Diagnosis not present

## 2023-03-16 DIAGNOSIS — E785 Hyperlipidemia, unspecified: Secondary | ICD-10-CM | POA: Diagnosis not present

## 2023-03-16 DIAGNOSIS — Z8249 Family history of ischemic heart disease and other diseases of the circulatory system: Secondary | ICD-10-CM | POA: Diagnosis not present

## 2023-03-16 DIAGNOSIS — Z923 Personal history of irradiation: Secondary | ICD-10-CM | POA: Diagnosis not present

## 2023-03-16 DIAGNOSIS — M4316 Spondylolisthesis, lumbar region: Secondary | ICD-10-CM | POA: Diagnosis not present

## 2023-03-16 DIAGNOSIS — I471 Supraventricular tachycardia, unspecified: Secondary | ICD-10-CM | POA: Diagnosis present

## 2023-03-16 DIAGNOSIS — Z79899 Other long term (current) drug therapy: Secondary | ICD-10-CM | POA: Diagnosis not present

## 2023-03-16 DIAGNOSIS — F32A Depression, unspecified: Secondary | ICD-10-CM | POA: Diagnosis present

## 2023-03-16 DIAGNOSIS — M48061 Spinal stenosis, lumbar region without neurogenic claudication: Secondary | ICD-10-CM | POA: Diagnosis not present

## 2023-03-16 DIAGNOSIS — M5136 Other intervertebral disc degeneration, lumbar region: Secondary | ICD-10-CM | POA: Diagnosis not present

## 2023-03-16 DIAGNOSIS — K219 Gastro-esophageal reflux disease without esophagitis: Secondary | ICD-10-CM | POA: Diagnosis present

## 2023-03-16 DIAGNOSIS — Z853 Personal history of malignant neoplasm of breast: Secondary | ICD-10-CM

## 2023-03-16 DIAGNOSIS — M4317 Spondylolisthesis, lumbosacral region: Secondary | ICD-10-CM | POA: Diagnosis not present

## 2023-03-16 DIAGNOSIS — M797 Fibromyalgia: Secondary | ICD-10-CM | POA: Diagnosis present

## 2023-03-16 DIAGNOSIS — M5116 Intervertebral disc disorders with radiculopathy, lumbar region: Secondary | ICD-10-CM | POA: Diagnosis not present

## 2023-03-16 LAB — GLUCOSE, CAPILLARY
Glucose-Capillary: 91 mg/dL (ref 70–99)
Glucose-Capillary: 117 mg/dL — ABNORMAL HIGH (ref 70–99)
Glucose-Capillary: 101 mg/dL — ABNORMAL HIGH (ref 70–99)
Glucose-Capillary: 136 mg/dL — ABNORMAL HIGH (ref 70–99)
Glucose-Capillary: 128 mg/dL — ABNORMAL HIGH (ref 70–99)

## 2023-03-16 SURGERY — POSTERIOR LUMBAR FUSION 3 LEVEL
Anesthesia: General | Site: Spine Lumbar

## 2023-03-16 MED ORDER — ACETAMINOPHEN 325 MG PO TABS: 650.0000 mg | ORAL_TABLET | ORAL | Status: DC | PRN

## 2023-03-16 MED ORDER — HYDROCODONE-ACETAMINOPHEN 10-325 MG PO TABS: 1.0000 | ORAL_TABLET | Freq: Four times a day (QID) | ORAL | Status: DC | PRN

## 2023-03-16 MED ORDER — SODIUM CHLORIDE 0.9 % IV SOLN: 250.0000 mL | INTRAVENOUS | Status: DC

## 2023-03-16 MED ORDER — CEFAZOLIN SODIUM-DEXTROSE 2-4 GM/100ML-% IV SOLN
2.0000 g | INTRAVENOUS | Status: AC
  Administered 2023-03-16: 2 g via INTRAVENOUS
  Filled 2023-03-16: qty 100

## 2023-03-16 MED ORDER — 0.9 % SODIUM CHLORIDE (POUR BTL) OPTIME
TOPICAL | Status: DC | PRN
  Administered 2023-03-16 (×2): 1000 mL

## 2023-03-16 MED ORDER — KETAMINE HCL 10 MG/ML IJ SOLN
INTRAMUSCULAR | Status: DC | PRN
Start: 2023-03-16 — End: 2023-03-16
  Administered 2023-03-16: 30 mg via INTRAVENOUS

## 2023-03-16 MED ORDER — TRAMADOL HCL 50 MG PO TABS: 50.0000 mg | ORAL_TABLET | Freq: Four times a day (QID) | ORAL | Status: DC | PRN

## 2023-03-16 MED ORDER — OXYCODONE HCL 5 MG PO TABS: 5.0000 mg | ORAL_TABLET | Freq: Once | ORAL | Status: DC | PRN

## 2023-03-16 MED ORDER — FENOFIBRATE 160 MG PO TABS
160.0000 mg | ORAL_TABLET | Freq: Every day | ORAL | Status: DC
  Administered 2023-03-16 – 2023-03-18 (×3): 160 mg via ORAL
  Filled 2023-03-16 (×3): qty 1

## 2023-03-16 MED ORDER — SODIUM CHLORIDE 0.9% FLUSH
3.0000 mL | Freq: Two times a day (BID) | INTRAVENOUS | Status: DC
  Administered 2023-03-16 – 2023-03-18 (×3): 3 mL via INTRAVENOUS

## 2023-03-16 MED ORDER — LACTATED RINGERS IV SOLN
INTRAVENOUS | Status: DC | PRN
Start: 2023-03-16 — End: 2023-03-16

## 2023-03-16 MED ORDER — LIDOCAINE 2% (20 MG/ML) 5 ML SYRINGE
INTRAMUSCULAR | Status: AC
  Filled 2023-03-16: qty 5

## 2023-03-16 MED ORDER — INSULIN ASPART 100 UNIT/ML IJ SOLN
0.0000 [IU] | Freq: Three times a day (TID) | INTRAMUSCULAR | Status: DC
  Administered 2023-03-16: 2 [IU] via SUBCUTANEOUS

## 2023-03-16 MED ORDER — INSULIN ASPART 100 UNIT/ML IJ SOLN: 0.0000 [IU] | INTRAMUSCULAR | Status: DC | PRN

## 2023-03-16 MED ORDER — ACETAMINOPHEN 10 MG/ML IV SOLN
INTRAVENOUS | Status: AC
  Filled 2023-03-16: qty 100

## 2023-03-16 MED ORDER — SUGAMMADEX SODIUM 200 MG/2ML IV SOLN
INTRAVENOUS | Status: DC | PRN
  Administered 2023-03-16: 100 mg via INTRAVENOUS

## 2023-03-16 MED ORDER — BUPIVACAINE HCL (PF) 0.25 % IJ SOLN
INTRAMUSCULAR | Status: AC
  Filled 2023-03-16: qty 30

## 2023-03-16 MED ORDER — ALUM & MAG HYDROXIDE-SIMETH 200-200-20 MG/5ML PO SUSP: 30.0000 mL | Freq: Four times a day (QID) | ORAL | Status: DC | PRN

## 2023-03-16 MED ORDER — HYDROMORPHONE HCL 1 MG/ML IJ SOLN
INTRAMUSCULAR | Status: AC
  Filled 2023-03-16: qty 1

## 2023-03-16 MED ORDER — ONDANSETRON HCL 4 MG/2ML IJ SOLN
INTRAMUSCULAR | Status: DC | PRN
  Administered 2023-03-16: 4 mg via INTRAVENOUS

## 2023-03-16 MED ORDER — PHENYLEPHRINE HCL (PRESSORS) 10 MG/ML IV SOLN
INTRAVENOUS | Status: AC
  Filled 2023-03-16: qty 1

## 2023-03-16 MED ORDER — ONDANSETRON HCL 4 MG/2ML IJ SOLN
INTRAMUSCULAR | Status: AC
  Filled 2023-03-16: qty 2

## 2023-03-16 MED ORDER — FERROUS SULFATE 325 (65 FE) MG PO TABS: 325.0000 mg | ORAL_TABLET | Freq: Every day | ORAL | Status: DC

## 2023-03-16 MED ORDER — ROCURONIUM BROMIDE 10 MG/ML (PF) SYRINGE
PREFILLED_SYRINGE | INTRAVENOUS | Status: AC
  Filled 2023-03-16: qty 10

## 2023-03-16 MED ORDER — OXYCODONE HCL 5 MG/5ML PO SOLN: 5.0000 mg | Freq: Once | ORAL | Status: DC | PRN

## 2023-03-16 MED ORDER — FENTANYL CITRATE (PF) 250 MCG/5ML IJ SOLN
INTRAMUSCULAR | Status: DC | PRN
  Administered 2023-03-16: 50 ug via INTRAVENOUS
  Administered 2023-03-16: 100 ug via INTRAVENOUS

## 2023-03-16 MED ORDER — LIDOCAINE-EPINEPHRINE 1 %-1:100000 IJ SOLN
INTRAMUSCULAR | Status: DC | PRN
  Administered 2023-03-16: 10 mL

## 2023-03-16 MED ORDER — MIDAZOLAM HCL 5 MG/5ML IJ SOLN
INTRAMUSCULAR | Status: DC | PRN
  Administered 2023-03-16: 2 mg via INTRAVENOUS

## 2023-03-16 MED ORDER — METFORMIN HCL 500 MG PO TABS
500.0000 mg | ORAL_TABLET | Freq: Two times a day (BID) | ORAL | Status: DC
  Administered 2023-03-16 – 2023-03-17 (×3): 500 mg via ORAL
  Filled 2023-03-16 (×3): qty 1

## 2023-03-16 MED ORDER — PHENOL 1.4 % MT LIQD: 1.0000 | OROMUCOSAL | Status: DC | PRN

## 2023-03-16 MED ORDER — ROSUVASTATIN CALCIUM 5 MG PO TABS
10.0000 mg | ORAL_TABLET | Freq: Every day | ORAL | Status: DC
  Administered 2023-03-16 – 2023-03-18 (×3): 10 mg via ORAL
  Filled 2023-03-16 (×3): qty 2

## 2023-03-16 MED ORDER — ONDANSETRON HCL 4 MG/2ML IJ SOLN: 4.0000 mg | Freq: Four times a day (QID) | INTRAMUSCULAR | Status: DC | PRN

## 2023-03-16 MED ORDER — CALCIUM CARBONATE-VITAMIN D 500-200 MG-UNIT PO TABS: 1.0000 | ORAL_TABLET | Freq: Two times a day (BID) | ORAL | Status: DC

## 2023-03-16 MED ORDER — DEXMEDETOMIDINE HCL IN NACL 80 MCG/20ML IV SOLN
INTRAVENOUS | Status: DC | PRN
Start: 2023-03-16 — End: 2023-03-16
  Administered 2023-03-16 (×2): 8 ug via INTRAVENOUS
  Administered 2023-03-16: 4 ug via INTRAVENOUS

## 2023-03-16 MED ORDER — GLUCOSAMINE-CHONDROITIN 500-400 MG PO TABS: 1.0000 | ORAL_TABLET | Freq: Every day | ORAL | Status: DC

## 2023-03-16 MED ORDER — PHENYLEPHRINE 80 MCG/ML (10ML) SYRINGE FOR IV PUSH (FOR BLOOD PRESSURE SUPPORT)
PREFILLED_SYRINGE | INTRAVENOUS | Status: DC | PRN
  Administered 2023-03-16: 80 ug via INTRAVENOUS
  Administered 2023-03-16: 160 ug via INTRAVENOUS
  Administered 2023-03-16: 80 ug via INTRAVENOUS

## 2023-03-16 MED ORDER — CYCLOBENZAPRINE HCL 10 MG PO TABS: 10.0000 mg | ORAL_TABLET | Freq: Three times a day (TID) | ORAL | Status: DC | PRN

## 2023-03-16 MED ORDER — HYDROMORPHONE HCL 1 MG/ML IJ SOLN
0.5000 mg | INTRAMUSCULAR | Status: DC | PRN
  Administered 2023-03-16: 0.5 mg via INTRAVENOUS
  Filled 2023-03-16: qty 0.5

## 2023-03-16 MED ORDER — DEXAMETHASONE SODIUM PHOSPHATE 10 MG/ML IJ SOLN
INTRAMUSCULAR | Status: AC
  Filled 2023-03-16: qty 1

## 2023-03-16 MED ORDER — ORAL CARE MOUTH RINSE: 15.0000 mL | Freq: Once | OROMUCOSAL | Status: AC

## 2023-03-16 MED ORDER — CHLORHEXIDINE GLUCONATE CLOTH 2 % EX PADS: 6.0000 | MEDICATED_PAD | Freq: Once | CUTANEOUS | Status: DC

## 2023-03-16 MED ORDER — LIDOCAINE 2% (20 MG/ML) 5 ML SYRINGE
INTRAMUSCULAR | Status: DC | PRN
  Administered 2023-03-16: 70 mg via INTRAVENOUS

## 2023-03-16 MED ORDER — PANTOPRAZOLE SODIUM 40 MG IV SOLR: 40.0000 mg | Freq: Every day | INTRAVENOUS | Status: DC

## 2023-03-16 MED ORDER — BUPIVACAINE LIPOSOME 1.3 % IJ SUSP
INTRAMUSCULAR | Status: DC | PRN
  Administered 2023-03-16: 20 mL

## 2023-03-16 MED ORDER — ONDANSETRON HCL 4 MG PO TABS: 4.0000 mg | ORAL_TABLET | Freq: Four times a day (QID) | ORAL | Status: DC | PRN

## 2023-03-16 MED ORDER — MULTIVITAMINS PO CAPS: 1.0000 | ORAL_CAPSULE | Freq: Every day | ORAL | Status: DC

## 2023-03-16 MED ORDER — ROCURONIUM BROMIDE 10 MG/ML (PF) SYRINGE
PREFILLED_SYRINGE | INTRAVENOUS | Status: DC | PRN
  Administered 2023-03-16: 60 mg via INTRAVENOUS
  Administered 2023-03-16 (×2): 20 mg via INTRAVENOUS

## 2023-03-16 MED ORDER — LOSARTAN POTASSIUM 50 MG PO TABS: 100.0000 mg | ORAL_TABLET | Freq: Every day | ORAL | Status: DC

## 2023-03-16 MED ORDER — VENLAFAXINE HCL ER 75 MG PO CP24
75.0000 mg | ORAL_CAPSULE | Freq: Every day | ORAL | Status: DC
  Administered 2023-03-17: 75 mg via ORAL
  Filled 2023-03-16: qty 1

## 2023-03-16 MED ORDER — PHENYLEPHRINE HCL-NACL 20-0.9 MG/250ML-% IV SOLN
INTRAVENOUS | Status: DC | PRN
  Administered 2023-03-16: 30 ug/min via INTRAVENOUS

## 2023-03-16 MED ORDER — PROPOFOL 10 MG/ML IV BOLUS
INTRAVENOUS | Status: AC
  Filled 2023-03-16: qty 20

## 2023-03-16 MED ORDER — ACETAMINOPHEN 10 MG/ML IV SOLN: 1000.0000 mg | Freq: Once | INTRAVENOUS | Status: DC | PRN

## 2023-03-16 MED ORDER — LINAGLIPTIN 5 MG PO TABS
5.0000 mg | ORAL_TABLET | Freq: Every day | ORAL | Status: DC
  Administered 2023-03-16 – 2023-03-18 (×3): 5 mg via ORAL
  Filled 2023-03-16 (×3): qty 1

## 2023-03-16 MED ORDER — LIDOCAINE-EPINEPHRINE 1 %-1:100000 IJ SOLN
INTRAMUSCULAR | Status: AC
  Filled 2023-03-16: qty 1

## 2023-03-16 MED ORDER — PANTOPRAZOLE SODIUM 40 MG PO TBEC
40.0000 mg | DELAYED_RELEASE_TABLET | Freq: Every day | ORAL | Status: DC
  Administered 2023-03-16 – 2023-03-18 (×3): 40 mg via ORAL
  Filled 2023-03-16 (×3): qty 1

## 2023-03-16 MED ORDER — FENTANYL CITRATE (PF) 250 MCG/5ML IJ SOLN
INTRAMUSCULAR | Status: AC
  Filled 2023-03-16: qty 5

## 2023-03-16 MED ORDER — LORAZEPAM 0.5 MG PO TABS: 2.0000 mg | ORAL_TABLET | Freq: Every evening | ORAL | Status: DC | PRN

## 2023-03-16 MED ORDER — SODIUM CHLORIDE 0.9% FLUSH: 3.0000 mL | INTRAVENOUS | Status: DC | PRN

## 2023-03-16 MED ORDER — PROPOFOL 10 MG/ML IV BOLUS
INTRAVENOUS | Status: DC | PRN
  Administered 2023-03-16: 30 mg via INTRAVENOUS
  Administered 2023-03-16: 50 mg via INTRAVENOUS
  Administered 2023-03-16: 120 mg via INTRAVENOUS

## 2023-03-16 MED ORDER — AMLODIPINE BESYLATE 5 MG PO TABS
5.0000 mg | ORAL_TABLET | Freq: Every day | ORAL | Status: DC
  Administered 2023-03-17 – 2023-03-18 (×2): 5 mg via ORAL
  Filled 2023-03-16 (×2): qty 1

## 2023-03-16 MED ORDER — ACETAMINOPHEN 650 MG RE SUPP: 650.0000 mg | RECTAL | Status: DC | PRN

## 2023-03-16 MED ORDER — KETAMINE HCL 50 MG/5ML IJ SOSY
PREFILLED_SYRINGE | INTRAMUSCULAR | Status: AC
  Filled 2023-03-16: qty 5

## 2023-03-16 MED ORDER — DEXAMETHASONE SODIUM PHOSPHATE 10 MG/ML IJ SOLN
INTRAMUSCULAR | Status: DC | PRN
  Administered 2023-03-16: 5 mg via INTRAVENOUS

## 2023-03-16 MED ORDER — CYCLOBENZAPRINE HCL 10 MG PO TABS
10.0000 mg | ORAL_TABLET | Freq: Three times a day (TID) | ORAL | Status: DC | PRN
  Administered 2023-03-16 – 2023-03-18 (×4): 10 mg via ORAL
  Filled 2023-03-16 (×4): qty 1

## 2023-03-16 MED ORDER — THROMBIN 20000 UNITS EX SOLR
CUTANEOUS | Status: AC
  Filled 2023-03-16: qty 20000

## 2023-03-16 MED ORDER — MENTHOL 3 MG MT LOZG: 1.0000 | LOZENGE | OROMUCOSAL | Status: DC | PRN

## 2023-03-16 MED ORDER — LACTATED RINGERS IV SOLN: INTRAVENOUS | Status: DC

## 2023-03-16 MED ORDER — THROMBIN 20000 UNITS EX SOLR: CUTANEOUS | Status: DC | PRN

## 2023-03-16 MED ORDER — PROPOFOL 500 MG/50ML IV EMUL
INTRAVENOUS | Status: DC | PRN
Start: 2023-03-16 — End: 2023-03-16
  Administered 2023-03-16: 25 ug/kg/min via INTRAVENOUS

## 2023-03-16 MED ORDER — HYDROMORPHONE HCL 1 MG/ML IJ SOLN
0.2500 mg | INTRAMUSCULAR | Status: DC | PRN
  Administered 2023-03-16 (×2): 0.25 mg via INTRAVENOUS

## 2023-03-16 MED ORDER — BUPIVACAINE LIPOSOME 1.3 % IJ SUSP
INTRAMUSCULAR | Status: AC
  Filled 2023-03-16: qty 20

## 2023-03-16 MED ORDER — ACETAMINOPHEN 10 MG/ML IV SOLN
1000.0000 mg | Freq: Once | INTRAVENOUS | Status: AC
  Administered 2023-03-16: 1000 mg via INTRAVENOUS

## 2023-03-16 MED ORDER — CEFAZOLIN SODIUM-DEXTROSE 2-4 GM/100ML-% IV SOLN
2.0000 g | Freq: Three times a day (TID) | INTRAVENOUS | Status: AC
  Administered 2023-03-16 – 2023-03-18 (×6): 2 g via INTRAVENOUS
  Filled 2023-03-16 (×6): qty 100

## 2023-03-16 MED ORDER — ONDANSETRON HCL 4 MG/2ML IJ SOLN: 4.0000 mg | Freq: Once | INTRAMUSCULAR | Status: DC | PRN

## 2023-03-16 MED ORDER — MIDAZOLAM HCL 2 MG/2ML IJ SOLN
INTRAMUSCULAR | Status: AC
  Filled 2023-03-16: qty 2

## 2023-03-16 MED ORDER — CHLORHEXIDINE GLUCONATE 0.12 % MT SOLN
15.0000 mL | Freq: Once | OROMUCOSAL | Status: AC
  Administered 2023-03-16: 15 mL via OROMUCOSAL
  Filled 2023-03-16: qty 15

## 2023-03-16 MED ORDER — HYDROCODONE-ACETAMINOPHEN 5-325 MG PO TABS
2.0000 | ORAL_TABLET | ORAL | Status: DC | PRN
  Administered 2023-03-16 – 2023-03-18 (×9): 2 via ORAL
  Filled 2023-03-16 (×9): qty 2

## 2023-03-16 MED ORDER — VALACYCLOVIR HCL 500 MG PO TABS
500.0000 mg | ORAL_TABLET | Freq: Every day | ORAL | Status: DC
  Administered 2023-03-17 – 2023-03-18 (×2): 500 mg via ORAL
  Filled 2023-03-16 (×2): qty 1

## 2023-03-16 MED ORDER — AMISULPRIDE (ANTIEMETIC) 5 MG/2ML IV SOLN: 10.0000 mg | Freq: Once | INTRAVENOUS | Status: DC | PRN

## 2023-03-16 SURGICAL SUPPLY — 78 items
ADH SKN CLS APL DERMABOND .7 (GAUZE/BANDAGES/DRESSINGS) ×1
APL SKNCLS STERI-STRIP NONHPOA (GAUZE/BANDAGES/DRESSINGS) ×1
BAG COUNTER SPONGE SURGICOUNT (BAG) ×1 IMPLANT
BAG SPNG CNTER NS LX DISP (BAG) ×1
BASKET BONE COLLECTION (BASKET) ×1 IMPLANT
BENZOIN TINCTURE PRP APPL 2/3 (GAUZE/BANDAGES/DRESSINGS) ×1 IMPLANT
BIT DRILL PLIF MAS DISP 5.5MM (DRILL) IMPLANT
BLADE BONE MILL MEDIUM (MISCELLANEOUS) ×1 IMPLANT
BLADE CLIPPER SURG (BLADE) IMPLANT
BLADE SURG 11 STRL SS (BLADE) ×1 IMPLANT
BONE VIVIGEN FORMABLE 10CC (Bone Implant) ×1 IMPLANT
BUR CUTTER 7.0 ROUND (BURR) ×1 IMPLANT
BUR MATCHSTICK NEURO 3.0 LAGG (BURR) ×1 IMPLANT
CANISTER SUCT 3000ML PPV (MISCELLANEOUS) ×1 IMPLANT
CAP RELINE MOD TULIP RMM (Cap) IMPLANT
CNTNR URN SCR LID CUP LEK RST (MISCELLANEOUS) ×1 IMPLANT
CONT SPEC 4OZ STRL OR WHT (MISCELLANEOUS) ×1
COVER BACK TABLE 60X90IN (DRAPES) ×1 IMPLANT
DERMABOND ADVANCED .7 DNX12 (GAUZE/BANDAGES/DRESSINGS) ×1 IMPLANT
DRAPE C-ARM 42X72 X-RAY (DRAPES) ×1 IMPLANT
DRAPE C-ARMOR (DRAPES) IMPLANT
DRAPE HALF SHEET 40X57 (DRAPES) IMPLANT
DRAPE LAPAROTOMY 100X72X124 (DRAPES) ×1 IMPLANT
DRAPE SURG 17X23 STRL (DRAPES) ×1 IMPLANT
DRILL PLIF MAS DISP 5.5MM (DRILL) ×1
DRSG OPSITE 4X5.5 SM (GAUZE/BANDAGES/DRESSINGS) ×1 IMPLANT
DRSG OPSITE POSTOP 4X8 (GAUZE/BANDAGES/DRESSINGS) ×1 IMPLANT
DURAPREP 26ML APPLICATOR (WOUND CARE) ×1 IMPLANT
ELECT BLADE 4.0 EZ CLEAN MEGAD (MISCELLANEOUS) ×1
ELECT REM PT RETURN 9FT ADLT (ELECTROSURGICAL) ×1
ELECTRODE BLDE 4.0 EZ CLN MEGD (MISCELLANEOUS) IMPLANT
ELECTRODE REM PT RTRN 9FT ADLT (ELECTROSURGICAL) ×1 IMPLANT
EVACUATOR 1/8 PVC DRAIN (DRAIN) IMPLANT
EVACUATOR 3/16 PVC DRAIN (DRAIN) ×1 IMPLANT
GAUZE 4X4 16PLY ~~LOC~~+RFID DBL (SPONGE) ×1 IMPLANT
GAUZE SPONGE 4X4 12PLY STRL (GAUZE/BANDAGES/DRESSINGS) ×1 IMPLANT
GLOVE BIO SURGEON STRL SZ7 (GLOVE) IMPLANT
GLOVE BIO SURGEON STRL SZ8 (GLOVE) ×2 IMPLANT
GLOVE BIOGEL PI IND STRL 7.0 (GLOVE) IMPLANT
GLOVE EXAM NITRILE XL STR (GLOVE) IMPLANT
GLOVE INDICATOR 8.5 STRL (GLOVE) ×2 IMPLANT
GOWN STRL REUS W/ TWL LRG LVL3 (GOWN DISPOSABLE) IMPLANT
GOWN STRL REUS W/ TWL XL LVL3 (GOWN DISPOSABLE) ×2 IMPLANT
GOWN STRL REUS W/TWL 2XL LVL3 (GOWN DISPOSABLE) IMPLANT
GOWN STRL REUS W/TWL LRG LVL3 (GOWN DISPOSABLE)
GOWN STRL REUS W/TWL XL LVL3 (GOWN DISPOSABLE) ×2
GRAFT BNE MATRIX VG FRMBL L 10 (Bone Implant) IMPLANT
KIT BASIN OR (CUSTOM PROCEDURE TRAY) ×1 IMPLANT
KIT GRAFTMAG DEL NEURO DISP (NEUROSURGERY SUPPLIES) IMPLANT
KIT TURNOVER KIT B (KITS) ×1 IMPLANT
MILL BONE PREP (MISCELLANEOUS) ×1 IMPLANT
NDL HYPO 21X1.5 SAFETY (NEEDLE) ×1 IMPLANT
NDL HYPO 25X1 1.5 SAFETY (NEEDLE) ×1 IMPLANT
NEEDLE HYPO 21X1.5 SAFETY (NEEDLE) ×1 IMPLANT
NEEDLE HYPO 25X1 1.5 SAFETY (NEEDLE) ×1 IMPLANT
NS IRRIG 1000ML POUR BTL (IV SOLUTION) ×1 IMPLANT
PACK LAMINECTOMY NEURO (CUSTOM PROCEDURE TRAY) ×1 IMPLANT
PAD ARMBOARD 7.5X6 YLW CONV (MISCELLANEOUS) ×3 IMPLANT
ROD RELINE O COCR 5.0X90MM (Rod) IMPLANT
SCREW LOCK RSS 4.5/5.0MM (Screw) IMPLANT
SCREW SHANK RELINE MOD 5.0X30 (Screw) ×10 IMPLANT
SCREW SHANK RELINE MOD 5.0X35 (Screw) IMPLANT
SCREW SHANK RELINE MOD 5.5X35 (Screw) ×1 IMPLANT
SPACER SUSTAIN TI 9X22X12 15D (Spacer) IMPLANT
SPACER SUSTAIN TI 9X22X12 8D (Spacer) IMPLANT
SPIKE FLUID TRANSFER (MISCELLANEOUS) ×1 IMPLANT
SPONGE SURGIFOAM ABS GEL 100 (HEMOSTASIS) ×1 IMPLANT
SPONGE T-LAP 4X18 ~~LOC~~+RFID (SPONGE) IMPLANT
STRIP CLOSURE SKIN 1/2X4 (GAUZE/BANDAGES/DRESSINGS) ×2 IMPLANT
SUT VIC AB 0 CT1 18XCR BRD8 (SUTURE) ×2 IMPLANT
SUT VIC AB 0 CT1 8-18 (SUTURE) ×2
SUT VIC AB 2-0 CT1 18 (SUTURE) ×2 IMPLANT
SUT VIC AB 4-0 PS2 27 (SUTURE) ×1 IMPLANT
SYR 20ML LL LF (SYRINGE) ×1 IMPLANT
TOWEL GREEN STERILE (TOWEL DISPOSABLE) ×1 IMPLANT
TOWEL GREEN STERILE FF (TOWEL DISPOSABLE) ×1 IMPLANT
TRAY FOLEY MTR SLVR 16FR STAT (SET/KITS/TRAYS/PACK) ×1 IMPLANT
WATER STERILE IRR 1000ML POUR (IV SOLUTION) ×1 IMPLANT

## 2023-03-16 NOTE — H&P (Signed)
Jill Richardson is an 67 y.o. female.   Chief Complaint: Back and right greater than left leg pain HPI: 67 year old female with progressive worsening back and bilateral leg pain worse on the right.  Workup revealed severe spinal stenosis grade 1 spondylolisthesis L3-4 L4-5 previous fusion at L5-S1 with recages with no conclusive evidence of fusion across the bottom of the cages to S1.  Due to the patient's progression of clinical syndrome imaging findings of a conservative treatment possible pseudoarthrosis at L5-S1 I recommended decompressive laminectomy interbody fusion at L3-4 and L4-5 with extension of screws down to S1.  And posterolateral arthrodesis at those levels.  I extensively went over the risks and benefits of the operation with her as well as perioperative course expectations of outcome and alternatives of surgery and she understood and agreed to proceed forward.  Past Medical History:  Diagnosis Date   Anemia    Breast cancer (HCC) 2008   Chronic kidney disease    Complication of anesthesia    Diabetes mellitus without complication (HCC)    Dysrhythmia    hx SVT   Fibromyalgia    GERD (gastroesophageal reflux disease)    Hyperlipidemia    Hypertension    Personal history of radiation therapy    left breast Ca   PONV (postoperative nausea and vomiting)    S/P lumbar fusion 2005   L5-S1   SVT (supraventricular tachycardia)    Umbilical hernia     Past Surgical History:  Procedure Laterality Date   BREAST LUMPECTOMY Left 2008   malignant   INSERTION OF MESH N/A 03/21/2018   Procedure: INSERTION OF MESH;  Surgeon: Manus Rudd, MD;  Location: [facility];  Service: General;  Laterality: N/A;  LMA   LUMBAR FUSION  2005   OVARY REMOVED  1997   UMBILICAL HERNIA REPAIR N/A 03/21/2018   Procedure: HERNIA REPAIR UMBILICAL ADULT WITH POSSIBLE MESH;  Surgeon: Manus Rudd, MD;  Location: [facility];  Service: General;  Laterality: N/A;  LMA     Family History  Problem Relation Age of Onset   Hypertension Mother    Diabetes Father    Hypertension Father        pulmonary HTN   Cancer Father    Kidney disease Father    Diabetes Sister    Hypertension Brother    Breast cancer Neg Hx    Social History:  reports that she has never smoked. She has never used smokeless tobacco. She reports that she does not drink alcohol and does not use drugs.  Allergies:  Allergies  Allergen Reactions   Lipitor  [Atorvastatin] Other (See Comments)    Muscle aches    Lodine [Etodolac] Other (See Comments)    STEVENS JOHNSONS     Medications Prior to Admission  Medication Sig Dispense Refill   amLODipine (NORVASC) 5 MG tablet Take 1 tablet (5 mg total) by mouth daily. 90 tablet 3   calcium-vitamin D (OSCAL WITH D) 500-200 MG-UNIT per tablet Take 1 tablet by mouth 2 (two) times daily.     cyclobenzaprine (FLEXERIL) 10 MG tablet Take 1 tablet (10 mg total) by mouth 3 (three) times daily as needed for muscle spasms. 90 tablet 1   fenofibrate 160 MG tablet Take 1 tablet (160 mg total) by mouth daily. 90 tablet 3   ferrous sulfate 325 (65 FE) MG tablet Take 325 mg by mouth daily.     glucosamine-chondroitin 500-400 MG tablet Take 1 tablet by mouth  daily.     HYDROcodone-acetaminophen (NORCO) 10-325 MG tablet Take 1 tablet by mouth every 6 (six) hours as needed. 15 tablet 0   JANUVIA 100 MG tablet Take 1 tablet (100 mg total) by mouth daily. 90 tablet 1   LORazepam (ATIVAN) 2 MG tablet Take 1 tablet (2 mg total) by mouth at bedtime. (Patient taking differently: Take 2 mg by mouth at bedtime as needed for anxiety or sleep.) 90 tablet 1   losartan (COZAAR) 100 MG tablet Take 1 tablet (100 mg total) by mouth daily. 90 tablet 3   metFORMIN (GLUCOPHAGE) 500 MG tablet Take 1 tablet (500 mg total) by mouth 2 (two) times daily. 180 tablet 3   Multiple Vitamin (MULTIVITAMIN) capsule Take 1 capsule by mouth daily.     pantoprazole (PROTONIX) 40 MG tablet  Take 1 tablet (40 mg total) by mouth daily. 90 tablet 3   rosuvastatin (CRESTOR) 10 MG tablet Take 1 tablet (10 mg total) by mouth daily. 90 tablet 3   traMADol (ULTRAM) 50 MG tablet Take 1 tablet by mouth every 6 hours as needed 45 tablet 0   valACYclovir (VALTREX) 500 MG tablet Take 1 tablet (500 mg total) by mouth daily for 5 days 90 tablet 3   venlafaxine XR (EFFEXOR-XR) 75 MG 24 hr capsule Take 1 capsule (75 mg total) by mouth daily with breakfast. 90 capsule 3   blood glucose meter kit and supplies Use to check blood sugar up to 4 times daily as directed 1 each 0   Continuous Glucose Receiver (DEXCOM G7 RECEIVER) DEVI Apply every ten days 3 each 3   Continuous Glucose Sensor (DEXCOM G7 SENSOR) MISC Apply every ten days 3 each 3    No results found for this or any previous visit (from the past 48 hour(s)). No results found.  Review of Systems  Musculoskeletal:  Positive for back pain.  Neurological:  Positive for numbness.    Blood pressure (!) 138/93, pulse 77, temperature 98.2 F (36.8 C), temperature source Oral, resp. rate 17, height 5\' 2"  (1.575 m), weight 70.3 kg, SpO2 97%. Physical Exam HENT:     Head: Normocephalic.     Right Ear: Tympanic membrane normal.     Nose: Nose normal.     Mouth/Throat:     Mouth: Mucous membranes are moist.  Eyes:     Pupils: Pupils are equal, round, and reactive to light.  Cardiovascular:     Rate and Rhythm: Normal rate.     Pulses: Normal pulses.  Pulmonary:     Effort: Pulmonary effort is normal.  Abdominal:     General: Abdomen is flat.  Musculoskeletal:        General: Normal range of motion.  Neurological:     Mental Status: She is alert.     Comments: Patient is awake and alert strength is 5 out of 5 iliopsoas, quads, hamstrings, gastrocs, into tibialis, and EHL.      Assessment/Plan 67 year old presents for decompressive laminectomies interbody fusions L3-4 L4-5 and redo posterior lateral arthrodesis L5-S1  [name],  MD 03/16/2023, 8:02 AM

## 2023-03-16 NOTE — Op Note (Signed)
Preoperative diagnosis: Lumbar spinal stenosis degenerative disc disease instability and grade 1 spondylolisthesis L3-4 L4-5 previous L5-S1 fusion with pseudoarthrosis  Postoperative diagnosis: Same.  Procedure: #1 complete decompressive laminectomy L3-4 L4-5 with complete medial facetectomies radical foraminotomies of the L3 L4 and L5 nerve roots in excess and requiring more work than would be needed with a standard interbody fusion.  2.  Posterior lumbar interbody fusion L3-4 L4-5 utilizing the globus insert and rotate titanium cages packed with locally harvested autograft mixed with Vivigen.  3.  Cortical screw fixation L3-S1 utilizing the NuVasive modular cortical screw set.  4.  Posterolateral arthrodesis L5-S1 utilizing locally harvested autograft mixed with Vivigen.  Surgeon: Donalee Citrin.  Assistant: Julien Girt.  Anesthesia: General.  EBL: Minimal.  HPI: 67 year old female with longstanding back pain previously undergone a right cage fusion at L5-S1 and presents with progressive worsening back and bilateral leg pain when workup revealed segmental degeneration above her fusion and possible pseudoarthrosis at L5-S1.  So due to her progression of clinical syndrome imaging findings of a conservative treatment I recommended decompressive laminectomies and interbody fusions at L3-4 and L4-5 and a posterior lateral arthrodesis to refuse L5-S1.  I extensively went over the risks and benefits of the operation with her as well as perioperative course expectations of outcome and alternatives of surgery and she understood and agreed to proceed forward.  Operative procedure: Patient was brought into the OR was induced under general anesthesia positioned prone the Wilson frame her back was prepped and draped in routine sterile fashion.  Roll incision was opened up and extended cephalad subperiosteal dissection was carried lamina of L3-L4-L5 and S1 bilaterally.  Interoperative x-ray confirmed  defecation appropriate level send the spinous process at L3 and L4 were removed central decompression was begun facets were drilled down with a high-speed drill capturing the bone shavings and mucus trap complete medial facetectomies were performed and radical foraminotomies of the L4 L3 and L5 nerve roots bilaterally marked hourglass compression of thecal sac at both L3-4 and L4-5 was causing severe spinal stenosis at this level aggressive under biting of both supra articulating facets at both levels also allowed access to the lateral margin of disc base.  Disc base was then cleaned out bilaterally endplates were prepared bilaterally and then I selected and sized up cages both 9 mm wide 12 mm and 15 degree at L3-4 and 9 x 12 x 8 degree at L4-5 both cages both sides at both levels were packed with locally harvested autograft mixed with an extensive mount of autograft mix centrally all this was done under fluoroscopy.  Then fluoroscopy was used to place cortical screws at L3, L4, L5 and S1.  All screws had excellent purchase although I did have to replace the L5 screws secondary to her hyperlordotic anatomy made it impossible for me to contour the rod to make that been from the pre-existing L5-S1 so I put in a longer screw of wider diameter so the screw head could be a little more proud.  I had drilled down the facet at S1 is much as I could.  Then rods were placed the knots were torqued down at L5 and S1 L4 was compressed against L5 and L3 was compressed against L4.  I then reinspected all the foramina to confirm patency and no migration of graft material and they were all patent I laid Gelfoam and top of the dura a medium Hemovac drain and the wound was closed in layers with interrupted Vicryl and a  running 4 subcuticular.  Dermabond benzoin Steri-Strips and a sterile dressing was applied patient recovery in stable condition.  At the end the case all needle counts and sponge counts were correct.

## 2023-03-16 NOTE — Transfer of Care (Signed)
Immediate Anesthesia Transfer of Care Note  Patient: KAILAYA HENANDEZ  Procedure(s) Performed: Posterior Lumbar Interbody Fusion - Lumbar Four-Lumbar Five - Lumbar Three-Lumbar Four with instrumentation extending down to the sacrum - Lumbar Five-Sacral One (Spine Lumbar)  Patient Location: PACU  Anesthesia Type:General  Level of Consciousness: awake and drowsy  Airway & Oxygen Therapy: Patient Spontanous Breathing  Post-op Assessment: Report given to RN, Post -op Vital signs reviewed and stable, and Patient moving all extremities  Post vital signs: Reviewed and stable  Last Vitals:  Vitals Value Taken Time  BP 109/67 03/16/23 1254  Temp    Pulse 65 03/16/23 1257  Resp 14 03/16/23 1257  SpO2 100 % 03/16/23 1257  Vitals shown include unfiled device data.  Last Pain:  Vitals:   03/16/23 0713  TempSrc:   PainSc: 4       Patients Stated Pain Goal: 0 (03/16/23 0713)  Complications: No notable events documented.

## 2023-03-16 NOTE — Anesthesia Preprocedure Evaluation (Addendum)
Anesthesia Evaluation  Patient identified by MRN, date of birth, ID band Patient awake    Reviewed: Allergy & Precautions, NPO status , Patient's Chart, lab work & pertinent test results  History of Anesthesia Complications (+) PONV and history of anesthetic complications  Airway Mallampati: II  TM Distance: >3 FB Neck ROM: Full    Dental no notable dental hx. (+) Teeth Intact, Dental Advisory Given   Pulmonary    Pulmonary exam normal breath sounds clear to auscultation       Cardiovascular hypertension, Pt. on medications Normal cardiovascular exam+ dysrhythmias Supra Ventricular Tachycardia  Rhythm:Regular Rate:Normal     Neuro/Psych  PSYCHIATRIC DISORDERS  Depression       GI/Hepatic ,GERD  Medicated and Controlled,,  Endo/Other  diabetes, Type 2, Oral Hypoglycemic Agents    Renal/GU Renal diseaseLab Results      Component                Value               Date                        K                        4.2                 03/08/2023                      CREATININE               1.28 (H)            03/08/2023                   Musculoskeletal  (+)  Fibromyalgia -  Abdominal   Peds  Hematology Lab Results      Component                Value               Date                      WBC                      7.1                 03/08/2023                HGB                      11.6 (L)            03/08/2023                HCT                      36.4                03/08/2023                MCV                      92.9                03/08/2023  PLT                      331                 03/08/2023              Anesthesia Other Findings All; Lodeine  L Breast CA  Reproductive/Obstetrics                             Anesthesia Physical Anesthesia Plan  ASA: 3  Anesthesia Plan: General   Post-op Pain Management: Ketamine IV* and Ofirmev IV (intra-op)*    Induction: Intravenous  PONV Risk Score and Plan: 4 or greater and Treatment may vary due to age or medical condition, Midazolam and Ondansetron  Airway Management Planned: Oral ETT  Additional Equipment: None  Intra-op Plan:   Post-operative Plan: Extubation in OR  Informed Consent:      Dental advisory given  Plan Discussed with: CRNA  Anesthesia Plan Comments: (2 X18 G IV)        Anesthesia Quick Evaluation

## 2023-03-16 NOTE — Anesthesia Procedure Notes (Signed)
Procedure Name: Intubation Date/Time: 03/16/2023 8:36 AM  Performed by: Waynard Edwards, CRNAPre-anesthesia Checklist: Patient identified, Emergency Drugs available, Suction available and Patient being monitored Patient Re-evaluated:Patient Re-evaluated prior to induction Oxygen Delivery Method: Circle system utilized Preoxygenation: Pre-oxygenation with 100% oxygen Induction Type: IV induction Ventilation: Mask ventilation without difficulty Laryngoscope Size: Mac and 3 Grade View: Grade I Tube type: Oral Tube size: 7.0 mm Number of attempts: 1 Airway Equipment and Method: Stylet Placement Confirmation: ETT inserted through vocal cords under direct vision, positive ETCO2 and breath sounds checked- equal and bilateral Secured at: 22 cm Tube secured with: Tape Dental Injury: Teeth and Oropharynx as per pre-operative assessment

## 2023-03-16 NOTE — Anesthesia Postprocedure Evaluation (Signed)
Anesthesia Post Note  Patient: Jill Richardson  Procedure(s) Performed: Posterior Lumbar Interbody Fusion - Lumbar Four-Lumbar Five - Lumbar Three-Lumbar Four with instrumentation extending down to the sacrum - Lumbar Five-Sacral One (Spine Lumbar)     Patient location during evaluation: PACU Anesthesia Type: General Level of consciousness: awake and alert Pain management: pain level controlled Vital Signs Assessment: post-procedure vital signs reviewed and stable Respiratory status: spontaneous breathing, nonlabored ventilation, respiratory function stable and patient connected to nasal cannula oxygen Cardiovascular status: blood pressure returned to baseline and stable Postop Assessment: no apparent nausea or vomiting Anesthetic complications: no   No notable events documented.  Last Vitals:  Vitals:   03/16/23 1415 03/16/23 1430  BP: 111/69 108/64  Pulse: 64 64  Resp: 16 14  Temp:    SpO2: 99% 97%    Last Pain:  Vitals:   03/16/23 1415  TempSrc:   PainSc: 9                  Trevor Iha

## 2023-03-17 LAB — GLUCOSE, CAPILLARY
Glucose-Capillary: 82 mg/dL (ref 70–99)
Glucose-Capillary: 86 mg/dL (ref 70–99)
Glucose-Capillary: 103 mg/dL — ABNORMAL HIGH (ref 70–99)
Glucose-Capillary: 112 mg/dL — ABNORMAL HIGH (ref 70–99)
Glucose-Capillary: 114 mg/dL — ABNORMAL HIGH (ref 70–99)

## 2023-03-17 MED ORDER — VENLAFAXINE HCL ER 75 MG PO CP24
75.0000 mg | ORAL_CAPSULE | Freq: Every day | ORAL | Status: DC
  Administered 2023-03-18: 75 mg via ORAL
  Filled 2023-03-17: qty 1

## 2023-03-17 MED ORDER — METFORMIN HCL 500 MG PO TABS
500.0000 mg | ORAL_TABLET | Freq: Two times a day (BID) | ORAL | Status: DC
  Administered 2023-03-18: 500 mg via ORAL
  Filled 2023-03-17: qty 1

## 2023-03-17 MED ORDER — TRAMADOL HCL 50 MG PO TABS
50.0000 mg | ORAL_TABLET | Freq: Four times a day (QID) | ORAL | Status: DC | PRN
  Administered 2023-03-17: 100 mg via ORAL
  Filled 2023-03-17: qty 2

## 2023-03-17 MED ORDER — SENNOSIDES-DOCUSATE SODIUM 8.6-50 MG PO TABS
1.0000 | ORAL_TABLET | Freq: Two times a day (BID) | ORAL | Status: DC | PRN
  Administered 2023-03-17: 1 via ORAL
  Filled 2023-03-17: qty 1

## 2023-03-17 MED ORDER — INSULIN ASPART 100 UNIT/ML IJ SOLN: 0.0000 [IU] | Freq: Three times a day (TID) | INTRAMUSCULAR | Status: DC

## 2023-03-17 NOTE — Progress Notes (Signed)
Subjective: Patient reports doing well, feels unstable on feet when walking   Objective: Vital signs in last 24 hours: Temp:  [97.7 F (36.5 C)-98.7 F (37.1 C)] 98.2 F (36.8 C) (09/19 0754) Pulse Rate:  [55-71] 69 (09/19 0754) Resp:  [11-20] 16 (09/19 0754) BP: (90-118)/(50-70) 106/68 (09/19 0754) SpO2:  [94 %-100 %] 99 % (09/19 0754)  Intake/Output from previous day: 09/18 0701 - 09/19 0700 In: 2090 [P.O.:240; I.V.:1650; IV Piggyback:200] Out: 990 [Urine:425; Drains:365; Blood:200] Intake/Output this shift: No intake/output data recorded.  Neurologic: Grossly normal  Lab Results: Lab Results  Component Value Date   WBC 7.1 03/08/2023   HGB 11.6 (L) 03/08/2023   HCT 36.4 03/08/2023   MCV 92.9 03/08/2023   PLT 331 03/08/2023   No results found for: "INR", "PROTIME" BMET Lab Results  Component Value Date   NA 135 03/08/2023   K 4.2 03/08/2023   CL 104 03/08/2023   CO2 23 03/08/2023   GLUCOSE 80 03/08/2023   BUN 30 (H) 03/08/2023   CREATININE 1.28 (H) 03/08/2023   CALCIUM 9.2 03/08/2023    Studies/Results: DG Lumbar Spine 2-3 Views  Result Date: 03/16/2023 CLINICAL DATA:  Elective surgery. EXAM: LUMBAR SPINE - 2-3 VIEW COMPARISON:  None Available. FINDINGS: Six fluoroscopic spot views of the lumbar spine obtained in the operating room in frontal and lateral projection. Sequential images during lumbar surgery with multilevel pedicle screw and interbody spacers. Fluoroscopy time 1 minutes 8 seconds. Dose 38.24 mGy. IMPRESSION: Intraoperative fluoroscopy during lumbar surgery. Electronically Signed   By: Narda Rutherford M.D.   On: 03/16/2023 13:01   DG C-Arm 1-60 Min-No Report  Result Date: 03/16/2023 Fluoroscopy was utilized by the requesting physician.  No radiographic interpretation.   DG C-Arm 1-60 Min-No Report  Result Date: 03/16/2023 Fluoroscopy was utilized by the requesting physician.  No radiographic interpretation.   DG C-Arm 1-60 Min-No  Report  Result Date: 03/16/2023 Fluoroscopy was utilized by the requesting physician.  No radiographic interpretation.    Assessment/Plan: Postop day 1 PLIF. Will work with therapy some more today. Maybe home this afternoon.   LOS: 1 day    Jill Richardson 03/17/2023, 7:59 AM

## 2023-03-17 NOTE — Evaluation (Signed)
Occupational Therapy Evaluation Patient Details Name: Jill Richardson MRN: [id number] DOB: [birth year] Today's Date: 03/17/2023   History of Present Illness 67 yo female s/p decompressive laminectomy and foraminotomies L3-5, PLIF L3-5, screw fixation L3-S1, posterolateral arthrodesis L5-S1 on 9/18. PMH includes anemia, breast ca, DM, fibromyalgia, HTN, HLD, L5-S1 fusion 2005, SVT, umbilical hernia repair 2019.   Clinical Impression   Pt was independent prior to admission, driving and working. Her son lives with her and will provide 24 hour care x 1 week. Pt presents with generalized weakness, pain and impaired standing balance. She requires CGA with RW for ambulation and contact guard to moderate assistance for ADLs. Pt owns AE for LB ADLs and a 3 in 1. She is checking to see if she has a RW. Will follow acutely, do not anticipate pt will need post acute OT.       If plan is discharge home, recommend the following: Help with stairs or ramp for entrance;Assistance with cooking/housework    Functional Status Assessment  Patient has had a recent decline in their functional status and demonstrates the ability to make significant improvements in function in a reasonable and predictable amount of time.  Equipment Recommendations  None recommended by OT    Recommendations for Other Services       Precautions / Restrictions Precautions Precautions: Back;Fall Precaution Booklet Issued: Yes (comment) Required Braces or Orthoses: Other Brace Other Brace: no brace needed per order, but pt received a brace prior to admission and prefers to wear Restrictions Weight Bearing Restrictions: No      Mobility Bed Mobility Overal bed mobility: Needs Assistance Bed Mobility: Sidelying to Sit   Sidelying to sit: Min assist       General bed mobility comments: pt received on her L side, light min assist for LEs over EOB, increased time    Transfers Overall transfer level: Needs  assistance Equipment used: Rolling walker (2 wheels) Transfers: Sit to/from Stand Sit to Stand: Contact guard assist                  Balance Overall balance assessment: Needs assistance   Sitting balance-Leahy Scale: Good       Standing balance-Leahy Scale: Fair Standing balance comment: fair static standing at sink, reliant on RW for dynamic                           ADL either performed or assessed with clinical judgement   ADL Overall ADL's : Needs assistance/impaired Eating/Feeding: Independent   Grooming: Contact guard assist;Wash/dry hands;Oral care;Standing Grooming Details (indicate cue type and reason): educated in 2 cup method for oral care, use of washcloth on face Upper Body Bathing: Minimal assistance;Sitting Upper Body Bathing Details (indicate cue type and reason): recommended pt use long handled bath sponge for back Lower Body Bathing: Sit to/from stand;Moderate assistance Lower Body Bathing Details (indicate cue type and reason): recommended pt use her reacher and long handled bath sponge seated on 3 in 1 in shower Upper Body Dressing : Sitting;Minimal assistance Upper Body Dressing Details (indicate cue type and reason): min assist for back brace and front opening gown Lower Body Dressing: Moderate assistance;Sit to/from stand Lower Body Dressing Details (indicate cue type and reason): recommended use of her reacher, sock aide Toilet Transfer: Contact guard assist;Ambulation;Comfort height toilet;Rolling walker (2 wheels)   Toileting- Clothing Manipulation and Hygiene: Modified independent;Sitting/lateral lean Toileting - Clothing Manipulation Details (indicate cue type and reason): instructed to  avoid twisting with pericare, use of toilet tongs, avoid twisting when flushing toilet     Functional mobility during ADLs: Contact guard assist;Rolling walker (2 wheels) General ADL Comments: Instructed in IADLs to avoid     Vision Ability to  See in Adequate Light: 0 Adequate Patient Visual Report: No change from baseline       Perception         Praxis         Pertinent Vitals/Pain Pain Assessment Pain Assessment: Faces Faces Pain Scale: Hurts even more Pain Location: back Pain Descriptors / Indicators: Grimacing, Guarding Pain Intervention(s): Monitored during session     Extremity/Trunk Assessment Upper Extremity Assessment Upper Extremity Assessment: Defer to OT evaluation   Lower Extremity Assessment Lower Extremity Assessment: Generalized weakness   Cervical / Trunk Assessment Cervical / Trunk Assessment: Back Surgery   Communication     Cognition Arousal: Alert Behavior During Therapy: WFL for tasks assessed/performed Overall Cognitive Status: Within Functional Limits for tasks assessed                                       General Comments       Exercises     Shoulder Instructions      Home Living Family/patient expects to be discharged to:: Private residence Living Arrangements: Children Available Help at Discharge: Family;Available 24 hours/day (x 1 week) Type of Home: House Home Access: Stairs to enter [employer] of Steps: 2 Entrance Stairs-Rails: Right Home Layout: One level;Laundry or work area in basement     [employer]: Producer, television/film/video: Handicapped height     Home Equipment: BSC/3in1;Grab bars - tub/shower;Adaptive equipment Adaptive Equipment: Reacher;Sock aid;Long-handled shoe horn;Long-handled sponge        Prior Functioning/Environment Prior Level of Function : Independent/Modified Independent;Working/employed;Driving               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        OT Problem List: Impaired balance (sitting and/or standing);Decreased strength;Pain;Decreased knowledge of use of DME or AE;Decreased knowledge of precautions      OT Treatment/Interventions:  Self-care/ADL training;DME and/or AE instruction;Therapeutic activities;Patient/family education    OT Goals(Current goals can be found in the care plan section) Acute Rehab OT Goals OT Goal Formulation: With patient Time For Goal Achievement: 03/31/23 Potential to Achieve Goals: Good ADL Goals Pt Will Perform Grooming: with modified independence;standing Pt Will Perform Lower Body Bathing: with modified independence;sit to/from stand;with adaptive equipment Pt Will Perform Lower Body Dressing: with modified independence;sit to/from stand;with adaptive equipment Pt Will Transfer to Toilet: with modified independence;ambulating Pt Will Perform Toileting - Clothing Manipulation and hygiene: with modified independence;sitting/lateral leans;sit to/from stand Pt Will Perform Tub/Shower Transfer: Shower transfer;with supervision;ambulating;3 in 1;rolling walker  OT Frequency: Min 1X/week    Co-evaluation              AM-PAC OT "6 Clicks" Daily Activity     Outcome Measure Help from another person eating meals?: None Help from another person taking care of personal grooming?: A Little Help from another person toileting, which includes using toliet, bedpan, or urinal?: A Little Help from another person bathing (including washing, rinsing, drying)?: A Lot Help from another person to put on and taking off regular upper body clothing?: A Little Help from another person to put on and taking off regular  lower body clothing?: A Lot 6 Click Score: 17   End of Session Equipment Utilized During Treatment: Rolling walker (2 wheels);Back brace;Gait belt  Activity Tolerance: Patient tolerated treatment well Patient left: in chair;with call bell/phone within reach  OT Visit Diagnosis: Unsteadiness on feet (R26.81);Other abnormalities of gait and mobility (R26.89);Pain;Muscle weakness (generalized) (M62.81)                Time: 1610-9604 OT Time Calculation (min): 19 min Charges:  OT General  Charges $OT Visit: 1 Visit OT Evaluation $OT Eval Moderate Complexity: 1 Mod  Berna Spare, OTR/L Acute Rehabilitation Services Office: [phone]   Evern Bio 03/17/2023, 10:34 AM

## 2023-03-17 NOTE — Evaluation (Signed)
Physical Therapy Evaluation Patient Details Name: Jill Richardson MRN: [id number] DOB: 1956-04-10 Today's Date: 03/17/2023  History of Present Illness  67 yo female s/p decompressive laminectomy and foraminotomies L3-5, PLIF L3-5, screw fixation L3-S1, posterolateral arthrodesis L5-S1 on 9/18. PMH includes anemia, breast ca, DM, fibromyalgia, HTN, HLD, L5-S1 fusion 2005, SVT, umbilical hernia repair 2019.  Clinical Impression   Pt presents with generalized weakness, post-op back pain, impaired balance, and decreased activity tolerance. Pt to benefit from acute PT to address deficits. Pt ambulated good hallway distance with use of RW, pt with worsening back pain and "catching" in back with further distance, RN notified of pt request for pain medication. Pt will have assist of son at d/c. PT to progress mobility as tolerated, and will continue to follow acutely.          If plan is discharge home, recommend the following: A little help with walking and/or transfers;A little help with bathing/dressing/bathroom   Can travel by private vehicle        Equipment Recommendations Rolling walker (2 wheels)  Recommendations for Other Services       Functional Status Assessment Patient has had a recent decline in their functional status and demonstrates the ability to make significant improvements in function in a reasonable and predictable amount of time.     Precautions / Restrictions Precautions Precautions: Back;Fall Precaution Booklet Issued: Yes (comment) Required Braces or Orthoses: Other Brace Other Brace: no brace needed per order, but pt received a brace prior to admission and prefers to wear Restrictions Weight Bearing Restrictions: No      Mobility  Bed Mobility Overal bed mobility: Needs Assistance Bed Mobility: Supine to Sit, Sit to Supine     Supine to sit: Min assist     General bed mobility comments: assist for hips to EOB, bringing LEs over EOB. Increased time and  effort, cues for log roll technique.    Transfers Overall transfer level: Needs assistance Equipment used: Rolling walker (2 wheels) Transfers: Sit to/from Stand Sit to Stand: Contact guard assist           General transfer comment: for safety, slow to rise. stand x2 from EOB and toilet    Ambulation/Gait Ambulation/Gait assistance: Contact guard assist, Supervision Gait Distance (Feet): 125 Feet Assistive device: Rolling walker (2 wheels) Gait Pattern/deviations: Step-through pattern, Decreased stride length, Trunk flexed Gait velocity: decr     General Gait Details: cues for upright posture, proximity of RW  Stairs            Wheelchair Mobility     Tilt Bed    Modified Rankin (Stroke Patients Only)       Balance Overall balance assessment: Needs assistance Sitting-balance support: No upper extremity supported, Feet supported Sitting balance-Leahy Scale: Fair     Standing balance support: Bilateral upper extremity supported, During functional activity Standing balance-Leahy Scale: Poor                               Pertinent Vitals/Pain Pain Assessment Pain Assessment: 0-10 Pain Score: 7  Pain Location: back Pain Descriptors / Indicators: Grimacing, Guarding Pain Intervention(s): Limited activity within patient's tolerance, Monitored during session, Repositioned, Patient requesting pain meds-RN notified    Home Living Family/patient expects to be discharged to:: Private residence Living Arrangements: Children Available Help at Discharge: Family;Available 24 hours/day (x 1 week) Type of Home: House Home Access: Stairs to enter Entrance Stairs-Rails: Right Entrance  Stairs-Number of Steps: 2   Home Layout: One level;Laundry or work area in [employer]: BSC/3in1;Grab bars - tub/shower;Adaptive equipment      Prior Function Prior Level of Function : Independent/Modified Independent;Working/employed;Driving                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     Extremity/Trunk Assessment   Upper Extremity Assessment Upper Extremity Assessment: Defer to OT evaluation    Lower Extremity Assessment Lower Extremity Assessment: Generalized weakness    Cervical / Trunk Assessment Cervical / Trunk Assessment: Back Surgery  Communication      Cognition Arousal: Alert Behavior During Therapy: [facility] for tasks assessed/performed Overall Cognitive Status: Within Functional Limits for tasks assessed                                          General Comments      Exercises  Home walking program: up and walking 1x/hour during waking hours for short household distances with supervision of family, to promote circulation, activity tolerance, and strength maintenance.     Assessment/Plan    PT Assessment Patient needs continued PT services  PT Problem List Decreased strength;Decreased balance;Decreased activity tolerance;Decreased coordination;Decreased cognition;Decreased knowledge of precautions;Cardiopulmonary status limiting activity;Decreased safety awareness;Pain;Decreased mobility       PT Treatment Interventions DME instruction;Therapeutic activities;Gait training;Therapeutic exercise;Patient/family education;Balance training;Stair training;Functional mobility training;Neuromuscular re-education    PT Goals (Current goals can be found in the Care Plan section)  Acute Rehab PT Goals PT Goal Formulation: With patient Time For Goal Achievement: 03/31/23 Potential to Achieve Goals: Good    Frequency Min 5X/week     Co-evaluation               AM-PAC PT "6 Clicks" Mobility  Outcome Measure Help needed turning from your back to your side while in a flat bed without using bedrails?: A Little Help needed moving from lying on your back to sitting on the side of a flat bed without using bedrails?: A Little Help needed moving to and  from a bed to a chair (including a wheelchair)?: A Little Help needed standing up from a chair using your arms (e.g., wheelchair or bedside chair)?: A Little Help needed to walk in hospital room?: A Little Help needed climbing 3-5 steps with a railing? : A Little 6 Click Score: 18    End of Session Equipment Utilized During Treatment: Back brace Activity Tolerance: Patient tolerated treatment well Patient left: in chair;with call bell/phone within reach Nurse Communication: Mobility status PT Visit Diagnosis: Other abnormalities of gait and mobility (R26.89);Muscle weakness (generalized) (M62.81)    Time: 1478-2956 PT Time Calculation (min) (ACUTE ONLY): 21 min   Charges:   PT Evaluation $PT Eval Low Complexity: 1 Low   PT General Charges $$ ACUTE PT VISIT: 1 Visit         Marye Round, PT DPT Acute Rehabilitation Services Secure Chat Preferred  Office [phone]   Nobuko Gsell E Christain [place] 03/17/2023, 10:34 AM

## 2023-03-18 ENCOUNTER — Other Ambulatory Visit: Payer: Self-pay

## 2023-03-18 ENCOUNTER — Encounter: Payer: Self-pay | Admitting: Pharmacist

## 2023-03-18 ENCOUNTER — Other Ambulatory Visit (HOSPITAL_COMMUNITY): Payer: Self-pay

## 2023-03-18 LAB — GLUCOSE, CAPILLARY
Glucose-Capillary: 81 mg/dL (ref 70–99)
Glucose-Capillary: 86 mg/dL (ref 70–99)

## 2023-03-18 MED ORDER — CYCLOBENZAPRINE HCL 10 MG PO TABS
10.0000 mg | ORAL_TABLET | Freq: Three times a day (TID) | ORAL | 1 refills | Status: AC | PRN
  Filled 2023-03-18 (×2): qty 90, 30d supply, fill #0
  Filled 2023-09-03: qty 90, 30d supply, fill #1

## 2023-03-18 MED ORDER — HYDROCODONE-ACETAMINOPHEN 10-325 MG PO TABS
1.0000 | ORAL_TABLET | ORAL | 0 refills | Status: DC | PRN
  Filled 2023-03-18 (×2): qty 40, 7d supply, fill #0

## 2023-03-18 NOTE — Progress Notes (Signed)
Patient awaiting family for discharge home, Patient in no acute distress nor complaints of pain nor discomfort; incision on back is clean, dry and intact; No c/o pain at this time. Room was checked and accounted for all patient's belongings; discharge instructions concerning her medications, incision care, follow up appointment and when to call the doctor as needed were all discussed with patient by RN and she expressed understanding on the instructions

## 2023-03-18 NOTE — Discharge Summary (Signed)
Physician Discharge Summary  Patient ID: Jill Richardson MRN: [id number] DOB/AGE: June 28, 1956 67 y.o.  Admit date: 03/16/2023 Discharge date: 03/18/2023  Admission Diagnoses: Lumbar spinal stenosis degenerative disc disease instability and grade 1 spondylolisthesis L3-4 L4-5 previous L5-S1 fusion with pseudoarthrosis     Discharge Diagnoses: same   Discharged Condition: good  Hospital Course: The patient was admitted on 03/16/2023 and taken to the operating room where the patient underwent PLIF L3-4, L4-5. The patient tolerated the procedure well and was taken to the recovery room and then to the floor in stable condition. The hospital course was routine. There were no complications. The wound remained clean dry and intact. Pt had appropriate back soreness. No complaints of leg pain or new N/T/W. The patient remained afebrile with stable vital signs, and tolerated a regular diet. The patient continued to increase activities, and pain was well controlled with oral pain medications.   Consults: None  Significant Diagnostic Studies:  Results for orders placed or performed during the hospital encounter of 03/16/23  Glucose, capillary  Result Value Ref Range   Glucose-Capillary 91 70 - 99 mg/dL   Comment 1 Document in Chart   Glucose, capillary  Result Value Ref Range   Glucose-Capillary 117 (H) 70 - 99 mg/dL  Glucose, capillary  Result Value Ref Range   Glucose-Capillary 101 (H) 70 - 99 mg/dL  Glucose, capillary  Result Value Ref Range   Glucose-Capillary 136 (H) 70 - 99 mg/dL  Glucose, capillary  Result Value Ref Range   Glucose-Capillary 128 (H) 70 - 99 mg/dL  Glucose, capillary  Result Value Ref Range   Glucose-Capillary 86 70 - 99 mg/dL  Glucose, capillary  Result Value Ref Range   Glucose-Capillary 103 (H) 70 - 99 mg/dL   Comment 1 Notify RN    Comment 2 Document in Chart   Glucose, capillary  Result Value Ref Range   Glucose-Capillary 82 70 - 99 mg/dL  Glucose, capillary   Result Value Ref Range   Glucose-Capillary 112 (H) 70 - 99 mg/dL  Glucose, capillary  Result Value Ref Range   Glucose-Capillary 114 (H) 70 - 99 mg/dL   Comment 1 Notify RN    Comment 2 Document in Chart   Glucose, capillary  Result Value Ref Range   Glucose-Capillary 81 70 - 99 mg/dL   Comment 1 Notify RN    Comment 2 Document in Chart   ABO/Rh  Result Value Ref Range   ABO/RH(D)      A POS Performed at [facility], [address], [place], [place] [address]     DG Lumbar Spine 2-3 Views  Result Date: 03/16/2023 CLINICAL DATA:  Elective surgery. EXAM: LUMBAR SPINE - 2-3 VIEW COMPARISON:  None Available. FINDINGS: Six fluoroscopic spot views of the lumbar spine obtained in the operating room in frontal and lateral projection. Sequential images during lumbar surgery with multilevel pedicle screw and interbody spacers. Fluoroscopy time 1 minutes 8 seconds. Dose 38.24 mGy. IMPRESSION: Intraoperative fluoroscopy during lumbar surgery. Electronically Signed   By: Narda Rutherford M.D.   On: 03/16/2023 13:01   DG C-Arm 1-60 Min-No Report  Result Date: 03/16/2023 Fluoroscopy was utilized by the requesting physician.  No radiographic interpretation.   DG C-Arm 1-60 Min-No Report  Result Date: 03/16/2023 Fluoroscopy was utilized by the requesting physician.  No radiographic interpretation.   DG C-Arm 1-60 Min-No Report  Result Date: 03/16/2023 Fluoroscopy was utilized by the requesting physician.  No radiographic interpretation.  Antibiotics:  Anti-infectives (From admission, onward)    Start     Dose/Rate Route Frequency Ordered Stop   03/17/23 1000  valACYclovir (VALTREX) tablet 500 mg        500 mg Oral Daily 03/16/23 1521     03/16/23 1700  ceFAZolin (ANCEF) IVPB 2g/100 mL premix        2 g 200 mL/hr over 30 Minutes Intravenous Every 8 hours 03/16/23 1521 03/18/23 0610   03/16/23 0700  ceFAZolin (ANCEF) IVPB 2g/100 mL premix        2 g 200 mL/hr over 30 Minutes  Intravenous On call to O.R. 03/16/23 0347 03/16/23 0911       Discharge Exam: Blood pressure 116/69, pulse 69, temperature 98.6 F (37 C), temperature source Oral, resp. rate 18, height 5\' 2"  (1.575 m), weight 70.3 kg, SpO2 100%. Neurologic: Grossly normal Ambulating and voiding well incision cdi   Discharge Medications:   Allergies as of 03/18/2023       Reactions   Lipitor  [atorvastatin] Other (See Comments)   Muscle aches    Lodine [etodolac] Other (See Comments)   STEVENS JOHNSONS         Medication List     TAKE these medications    amLODipine 5 MG tablet Commonly known as: NORVASC Take 1 tablet (5 mg total) by mouth daily.   calcium-vitamin D 500-200 MG-UNIT tablet Commonly known as: OSCAL WITH D Take 1 tablet by mouth 2 (two) times daily.   cyclobenzaprine 10 MG tablet Commonly known as: FLEXERIL Take 1 tablet (10 mg total) by mouth 3 (three) times daily as needed for muscle spasms.   Dexcom G7 Receiver [employer] Apply every ten days   Dexcom G7 Sensor Misc Apply every ten days   fenofibrate 160 MG tablet Take 1 tablet (160 mg total) by mouth daily.   ferrous sulfate 325 (65 FE) MG tablet Take 325 mg by mouth daily.   glucosamine-chondroitin 500-400 MG tablet Take 1 tablet by mouth daily.   HYDROcodone-acetaminophen 10-325 MG tablet Commonly known as: NORCO Take 1 tablet by mouth every 4 (four) hours as needed. What changed: when to take this   Januvia 100 MG tablet Generic drug: sitaGLIPtin Take 1 tablet (100 mg total) by mouth daily.   LORazepam 2 MG tablet Commonly known as: ATIVAN Take 1 tablet (2 mg total) by mouth at bedtime. What changed:  when to take this reasons to take this   losartan 100 MG tablet Commonly known as: COZAAR Take 1 tablet (100 mg total) by mouth daily.   metFORMIN 500 MG tablet Commonly known as: GLUCOPHAGE Take 1 tablet (500 mg total) by mouth 2 (two) times daily.   multivitamin capsule Take 1 capsule by  mouth daily.   ONE TOUCH ULTRA 2 w/Device Kit Use to check blood sugar up to 4 times daily as directed   pantoprazole 40 MG tablet Commonly known as: PROTONIX Take 1 tablet (40 mg total) by mouth daily.   rosuvastatin 10 MG tablet Commonly known as: CRESTOR Take 1 tablet (10 mg total) by mouth daily.   traMADol 50 MG tablet Commonly known as: ULTRAM Take 1 tablet by mouth every 6 hours as needed   valACYclovir 500 MG tablet Commonly known as: VALTREX Take 1 tablet (500 mg total) by mouth daily for 5 days   venlafaxine XR 75 MG 24 hr capsule Commonly known as: EFFEXOR-XR Take 1 capsule (75 mg total) by mouth daily with breakfast.  Disposition: home   Final Dx: PLIF L3-4, L4-5  Discharge Instructions      Remove dressing in 72 hours   Complete by: As directed    Call MD for:  difficulty breathing, headache or visual disturbances   Complete by: As directed    Call MD for:  persistant nausea and vomiting   Complete by: As directed    Call MD for:  redness, tenderness, or signs of infection (pain, swelling, redness, odor or green/yellow discharge around incision site)   Complete by: As directed    Call MD for:  severe uncontrolled pain   Complete by: As directed    Call MD for:  temperature >100.4   Complete by: As directed    Diet - low sodium heart healthy   Complete by: As directed    Increase activity slowly   Complete by: As directed           Signed: Tiana Loft Brinsley Wence 03/18/2023, 7:34 AM

## 2023-03-18 NOTE — Progress Notes (Signed)
Physical Therapy Treatment  Patient Details Name: Jill Richardson MRN: [id number] DOB: March 20, 1956 Today's Date: 03/18/2023   History of Present Illness Pt is a 67 y/o female who presents s/p decompressive laminectomy and foraminotomies L3-5, PLIF L3-5, screw fixation L3-S1, posterolateral arthrodesis L5-S1 on 03/16/23. PMH includes anemia, breast ca, DM, fibromyalgia, HTN, L5-S1 fusion 2005, SVT, umbilical hernia repair 2019.    PT Comments  Pt progressing well with post-op mobility. She was able to demonstrate transfers and ambulation with gross modified independence to supervision for safety with RW for support. Reinforced education on precautions, brace application/wearing schedule, appropriate activity progression, and car transfer. Will continue to follow.      If plan is discharge home, recommend the following: A little help with walking and/or transfers;A little help with bathing/dressing/bathroom   Can travel by private vehicle        Equipment Recommendations  Rolling walker (2 wheels)    Recommendations for Other Services       Precautions / Restrictions Precautions Precautions: Back;Fall Precaution Booklet Issued: Yes (comment) Required Braces or Orthoses: Spinal Brace Spinal Brace: Lumbar corset;Applied in sitting position Restrictions Weight Bearing Restrictions: No     Mobility  Bed Mobility Overal bed mobility: Modified Independent Bed Mobility: Rolling, Sidelying to Sit, Sit to Sidelying           General bed mobility comments: HOB flat and rails lowered to simulate home environment. No assist required and overall good log roll technique.    Transfers Overall transfer level: Needs assistance Equipment used: Rolling walker (2 wheels) Transfers: Sit to/from Stand Sit to Stand: Supervision           General transfer comment: VC's for hand placement on seated surface for safety. No assist required.    Ambulation/Gait Ambulation/Gait assistance:  Contact guard assist, Supervision Gait Distance (Feet): 150 Feet Assistive device: Rolling walker (2 wheels) Gait Pattern/deviations: Step-through pattern, Decreased stride length, Trunk flexed Gait velocity: Decreased Gait velocity interpretation: 1.31 - 2.62 ft/sec, indicative of limited community ambulator   General Gait Details: VC's for improved posture, closer walker proximity and forward gaze. No overt LOB noted. Pt appears guarded and slow due to pain.   Stairs Stairs: Yes Stairs assistance: Contact guard assist, Min assist Stair Management: One rail Left, Step to pattern, Forwards Number of Stairs: 3 (1+2) General stair comments: VC's for sequencing and general safety. Pt reaching for R railing and provided with HHA on the R instead to simulate home environment with son assisting pt to enter home.   Wheelchair Mobility     Tilt Bed    Modified Rankin (Stroke Patients Only)       Balance Overall balance assessment: Needs assistance Sitting-balance support: No upper extremity supported, Feet supported Sitting balance-Leahy Scale: Fair     Standing balance support: Bilateral upper extremity supported, During functional activity, Reliant on assistive device for balance Standing balance-Leahy Scale: Poor                              Cognition Arousal: Alert Behavior During Therapy: WFL for tasks assessed/performed Overall Cognitive Status: Within Functional Limits for tasks assessed                                          Exercises      General Comments  Pertinent Vitals/Pain Pain Assessment Pain Assessment: Faces Faces Pain Scale: Hurts little more Pain Location: back Pain Descriptors / Indicators: Operative site guarding, Sore Pain Intervention(s): Limited activity within patient's tolerance, Monitored during session, Repositioned    Home Living                          Prior Function            PT  Goals (current goals can now be found in the care plan section) Acute Rehab PT Goals Patient Stated Goal: Decrease oain PT Goal Formulation: With patient Time For Goal Achievement: 03/31/23 Potential to Achieve Goals: Good Progress towards PT goals: Progressing toward goals    Frequency    Min 5X/week      PT Plan      Co-evaluation              AM-PAC PT "6 Clicks" Mobility   Outcome Measure  Help needed turning from your back to your side while in a flat bed without using bedrails?: A Little Help needed moving from lying on your back to sitting on the side of a flat bed without using bedrails?: A Little Help needed moving to and from a bed to a chair (including a wheelchair)?: A Little Help needed standing up from a chair using your arms (e.g., wheelchair or bedside chair)?: A Little Help needed to walk in hospital room?: A Little Help needed climbing 3-5 steps with a railing? : A Little 6 Click Score: 18    End of Session Equipment Utilized During Treatment: Back brace Activity Tolerance: Patient tolerated treatment well Patient left: in chair;with call bell/phone within reach Nurse Communication: Mobility status PT Visit Diagnosis: Other abnormalities of gait and mobility (R26.89);Muscle weakness (generalized) (M62.81)     Time: 4782-9562 PT Time Calculation (min) (ACUTE ONLY): 19 min  Charges:      PT General Charges $$ ACUTE PT VISIT: 1 Visit                     Conni Slipper, PT, DPT Acute Rehabilitation Services Secure Chat Preferred Office: [phone]    Jill Richardson 03/18/2023, 9:26 AM

## 2023-03-18 NOTE — Progress Notes (Signed)
Occupational Therapy Treatment Patient Details Name: Jill Richardson MRN: [id number] DOB: July 09, 1955 Today's Date: 03/18/2023   History of present illness Pt is a 67 y/o female who presents s/p decompressive laminectomy and foraminotomies L3-5, PLIF L3-5, screw fixation L3-S1, posterolateral arthrodesis L5-S1 on 03/16/23. PMH includes anemia, breast ca, DM, fibromyalgia, HTN, L5-S1 fusion 2005, SVT, umbilical hernia repair 2019.   OT comments  Pt moving with less effort today. Mod I for bed mobility, ambulation to bathroom and standing grooming. Pt is aware of how to use AE for LB ADLs and IADLs to avoid. Plans to discharge home with her supportive son's assistance later today.       If plan is discharge home, recommend the following:  Help with stairs or ramp for entrance;Assistance with cooking/housework   Equipment Recommendations  None recommended by OT    Recommendations for Other Services      Precautions / Restrictions Precautions Precautions: Back;Fall Precaution Booklet Issued: Yes (comment) Precaution Comments: generalized with movement and ADLs Required Braces or Orthoses: Spinal Brace Spinal Brace: Lumbar corset;Applied in sitting position Other Brace: no brace needed per order, but pt received a brace prior to admission and prefers to wear Restrictions Weight Bearing Restrictions: No       Mobility Bed Mobility Overal bed mobility: Modified Independent             General bed mobility comments: good technique    Transfers Overall transfer level: Modified independent Equipment used: Rolling walker (2 wheels)               General transfer comment: has been routinely walking to bathroom on her own     Balance     Sitting balance-Leahy Scale: Fair       Standing balance-Leahy Scale: Fair Standing balance comment: fair static standing at sink, reliant on RW for dynamic                           ADL either performed or assessed with  clinical judgement   ADL Overall ADL's : Needs assistance/impaired     Grooming: Modified independent;Oral care;Wash/dry hands;Standing                   Toilet Transfer: Modified Independent;Rolling walker (2 wheels);Ambulation;Comfort height toilet   Toileting- Clothing Manipulation and Hygiene: Modified independent;Sit to/from stand       Functional mobility during ADLs: Modified independent;Rolling walker (2 wheels) General ADL Comments: Pt is aware of how to use AE for LB ADLs, declined practice.    Extremity/Trunk Assessment              Vision       Perception     Praxis      Cognition Arousal: Alert Behavior During Therapy: WFL for tasks assessed/performed Overall Cognitive Status: Within Functional Limits for tasks assessed                                          Exercises      Shoulder Instructions       General Comments      Pertinent Vitals/ Pain       Pain Assessment Pain Assessment: Faces Faces Pain Scale: Hurts little more Pain Location: back Pain Descriptors / Indicators: Operative site guarding, Sore, Aching Pain Intervention(s): Monitored during session, Premedicated before session  Home Living  Prior Functioning/Environment              Frequency  Min 1X/week        Progress Toward Goals  OT Goals(current goals can now be found in the care plan section)  Progress towards OT goals: Progressing toward goals  Acute Rehab OT Goals OT Goal Formulation: With patient Time For Goal Achievement: 03/31/23 Potential to Achieve Goals: Good  Plan      Co-evaluation                 AM-PAC OT "6 Clicks" Daily Activity     Outcome Measure   Help from another person eating meals?: None Help from another person taking care of personal grooming?: None Help from another person toileting, which includes using toliet, bedpan, or urinal?:  None Help from another person bathing (including washing, rinsing, drying)?: None Help from another person to put on and taking off regular upper body clothing?: None Help from another person to put on and taking off regular lower body clothing?: None 6 Click Score: 24    End of Session Equipment Utilized During Treatment: Rolling walker (2 wheels)  OT Visit Diagnosis: Unsteadiness on feet (R26.81);Other abnormalities of gait and mobility (R26.89);Pain;Muscle weakness (generalized) (M62.81)   Activity Tolerance Patient tolerated treatment well   Patient Left in bed;with call bell/phone within reach   Nurse Communication          Time: 1027-2536 OT Time Calculation (min): 21 min  Charges: OT General Charges $OT Visit: 1 Visit OT Treatments $Self Care/Home Management : 8-22 mins  Berna Spare, OTR/L Acute Rehabilitation Services Office: [phone]   Jill Richardson 03/18/2023, 1:02 PM

## 2023-03-21 ENCOUNTER — Encounter (HOSPITAL_COMMUNITY): Payer: Self-pay | Admitting: Neurosurgery

## 2023-03-22 ENCOUNTER — Other Ambulatory Visit (HOSPITAL_COMMUNITY): Payer: Self-pay

## 2023-03-22 MED ORDER — METHYLPREDNISOLONE 4 MG PO TBPK
ORAL_TABLET | ORAL | 0 refills | Status: DC
  Filled 2023-03-22: qty 21, 6d supply, fill #0

## 2023-03-23 ENCOUNTER — Other Ambulatory Visit (HOSPITAL_COMMUNITY): Payer: Self-pay

## 2023-03-23 ENCOUNTER — Other Ambulatory Visit: Payer: Self-pay

## 2023-03-29 ENCOUNTER — Other Ambulatory Visit (HOSPITAL_COMMUNITY): Payer: Self-pay

## 2023-03-29 MED ORDER — METHYLPREDNISOLONE 4 MG PO TBPK
ORAL_TABLET | ORAL | 0 refills | Status: DC
  Filled 2023-03-29: qty 21, 21d supply, fill #0

## 2023-03-29 MED ORDER — METHOCARBAMOL 750 MG PO TABS
750.0000 mg | ORAL_TABLET | Freq: Three times a day (TID) | ORAL | 0 refills | Status: DC
  Filled 2023-03-29: qty 40, 14d supply, fill #0

## 2023-03-29 MED ORDER — HYDROCODONE-ACETAMINOPHEN 5-325 MG PO TABS
1.0000 | ORAL_TABLET | Freq: Four times a day (QID) | ORAL | 0 refills | Status: AC | PRN
Start: 2023-03-29 — End: ?
  Filled 2023-03-29: qty 60, 15d supply, fill #0

## 2023-04-05 ENCOUNTER — Other Ambulatory Visit (HOSPITAL_COMMUNITY): Payer: Self-pay

## 2023-04-05 DIAGNOSIS — M4316 Spondylolisthesis, lumbar region: Secondary | ICD-10-CM | POA: Diagnosis not present

## 2023-04-05 MED ORDER — HYDROCODONE-ACETAMINOPHEN 5-325 MG PO TABS
1.0000 | ORAL_TABLET | Freq: Four times a day (QID) | ORAL | 0 refills | Status: DC | PRN
  Filled 2023-04-05 – 2023-04-11 (×3): qty 60, 15d supply, fill #0

## 2023-04-11 ENCOUNTER — Other Ambulatory Visit (HOSPITAL_COMMUNITY): Payer: Self-pay

## 2023-04-11 ENCOUNTER — Other Ambulatory Visit: Payer: Self-pay

## 2023-04-21 ENCOUNTER — Other Ambulatory Visit (HOSPITAL_COMMUNITY): Payer: Self-pay

## 2023-04-21 ENCOUNTER — Other Ambulatory Visit: Payer: Self-pay

## 2023-04-22 LAB — HM DIABETES EYE EXAM

## 2023-04-25 DIAGNOSIS — E785 Hyperlipidemia, unspecified: Secondary | ICD-10-CM | POA: Diagnosis not present

## 2023-04-25 DIAGNOSIS — I129 Hypertensive chronic kidney disease with stage 1 through stage 4 chronic kidney disease, or unspecified chronic kidney disease: Secondary | ICD-10-CM | POA: Diagnosis not present

## 2023-04-25 DIAGNOSIS — E1122 Type 2 diabetes mellitus with diabetic chronic kidney disease: Secondary | ICD-10-CM | POA: Diagnosis not present

## 2023-04-25 DIAGNOSIS — N1832 Chronic kidney disease, stage 3b: Secondary | ICD-10-CM | POA: Diagnosis not present

## 2023-04-26 ENCOUNTER — Other Ambulatory Visit: Payer: Self-pay

## 2023-04-26 DIAGNOSIS — N1832 Chronic kidney disease, stage 3b: Secondary | ICD-10-CM | POA: Diagnosis not present

## 2023-04-26 DIAGNOSIS — M4316 Spondylolisthesis, lumbar region: Secondary | ICD-10-CM | POA: Diagnosis not present

## 2023-05-05 ENCOUNTER — Other Ambulatory Visit (HOSPITAL_COMMUNITY): Payer: Self-pay

## 2023-05-13 ENCOUNTER — Other Ambulatory Visit (HOSPITAL_COMMUNITY): Payer: Self-pay

## 2023-05-13 ENCOUNTER — Telehealth: Payer: Self-pay | Admitting: Pharmacy Technician

## 2023-05-13 NOTE — Telephone Encounter (Signed)
Pharmacy Patient Advocate Encounter   Received notification from CoverMyMeds that prior authorization for Dexcom G7 Sensor is required/requested.   Insurance verification completed.   The patient is insured through [facility] [employer] .   Per test claim: PA required; PA submitted to above mentioned insurance via CoverMyMeds Key/confirmation #/EOC [id number] Status is pending

## 2023-05-16 NOTE — Telephone Encounter (Signed)
Pharmacy Patient Advocate Encounter  Received notification from [facility] [employer] that Prior Authorization for Dexcom G7 Sensor has been DENIED.  Full denial letter will be uploaded to the media tab. See denial reason below.   PA #/Case ID/Reference #: [id number]

## 2023-06-02 DIAGNOSIS — M4316 Spondylolisthesis, lumbar region: Secondary | ICD-10-CM | POA: Diagnosis not present

## 2023-06-02 DIAGNOSIS — Z6828 Body mass index (BMI) 28.0-28.9, adult: Secondary | ICD-10-CM | POA: Diagnosis not present

## 2023-06-10 ENCOUNTER — Other Ambulatory Visit (HOSPITAL_COMMUNITY): Payer: Self-pay

## 2023-06-18 DIAGNOSIS — R0981 Nasal congestion: Secondary | ICD-10-CM | POA: Diagnosis not present

## 2023-06-18 DIAGNOSIS — R059 Cough, unspecified: Secondary | ICD-10-CM | POA: Diagnosis not present

## 2023-06-18 DIAGNOSIS — J0100 Acute maxillary sinusitis, unspecified: Secondary | ICD-10-CM | POA: Diagnosis not present

## 2023-06-18 DIAGNOSIS — J040 Acute laryngitis: Secondary | ICD-10-CM | POA: Diagnosis not present

## 2023-06-28 ENCOUNTER — Other Ambulatory Visit (HOSPITAL_COMMUNITY): Payer: Self-pay

## 2023-06-28 ENCOUNTER — Other Ambulatory Visit (HOSPITAL_BASED_OUTPATIENT_CLINIC_OR_DEPARTMENT_OTHER): Payer: Self-pay

## 2023-06-28 DIAGNOSIS — H26491 Other secondary cataract, right eye: Secondary | ICD-10-CM | POA: Diagnosis not present

## 2023-06-28 DIAGNOSIS — H18413 Arcus senilis, bilateral: Secondary | ICD-10-CM | POA: Diagnosis not present

## 2023-06-28 DIAGNOSIS — E119 Type 2 diabetes mellitus without complications: Secondary | ICD-10-CM | POA: Diagnosis not present

## 2023-06-28 DIAGNOSIS — H26493 Other secondary cataract, bilateral: Secondary | ICD-10-CM | POA: Diagnosis not present

## 2023-06-28 DIAGNOSIS — Z961 Presence of intraocular lens: Secondary | ICD-10-CM | POA: Diagnosis not present

## 2023-06-28 MED ORDER — PREDNISOLONE ACETATE 1 % OP SUSP
1.0000 [drp] | Freq: Four times a day (QID) | OPHTHALMIC | 0 refills | Status: DC
  Filled 2023-06-28: qty 5, 25d supply, fill #0

## 2023-06-28 MED ORDER — PREDNISOLONE ACETATE 1 % OP SUSP: OPHTHALMIC | 0 refills | Status: DC

## 2023-06-29 ENCOUNTER — Other Ambulatory Visit (HOSPITAL_BASED_OUTPATIENT_CLINIC_OR_DEPARTMENT_OTHER): Payer: Self-pay

## 2023-08-03 ENCOUNTER — Other Ambulatory Visit (HOSPITAL_COMMUNITY): Payer: Self-pay

## 2023-08-03 ENCOUNTER — Other Ambulatory Visit: Payer: Self-pay | Admitting: Internal Medicine

## 2023-08-04 ENCOUNTER — Other Ambulatory Visit (HOSPITAL_COMMUNITY): Payer: Self-pay

## 2023-08-04 MED ORDER — PANTOPRAZOLE SODIUM 40 MG PO TBEC
40.0000 mg | DELAYED_RELEASE_TABLET | Freq: Every day | ORAL | 3 refills | Status: DC
  Filled 2023-08-04: qty 90, 90d supply, fill #0
  Filled 2023-10-28: qty 90, 90d supply, fill #1
  Filled 2024-01-30: qty 90, 90d supply, fill #2
  Filled 2024-04-30: qty 90, 90d supply, fill #3

## 2023-08-04 MED ORDER — METFORMIN HCL 500 MG PO TABS
500.0000 mg | ORAL_TABLET | Freq: Two times a day (BID) | ORAL | 3 refills | Status: DC
  Filled 2023-08-04: qty 180, 90d supply, fill #0
  Filled 2023-10-31: qty 180, 90d supply, fill #1
  Filled 2024-01-30: qty 180, 90d supply, fill #2

## 2023-08-11 ENCOUNTER — Other Ambulatory Visit: Payer: PPO

## 2023-08-11 DIAGNOSIS — M2550 Pain in unspecified joint: Secondary | ICD-10-CM

## 2023-08-11 DIAGNOSIS — G8929 Other chronic pain: Secondary | ICD-10-CM

## 2023-08-11 DIAGNOSIS — E611 Iron deficiency: Secondary | ICD-10-CM

## 2023-08-11 DIAGNOSIS — I10 Essential (primary) hypertension: Secondary | ICD-10-CM

## 2023-08-11 DIAGNOSIS — N1832 Chronic kidney disease, stage 3b: Secondary | ICD-10-CM

## 2023-08-11 DIAGNOSIS — M797 Fibromyalgia: Secondary | ICD-10-CM

## 2023-08-11 DIAGNOSIS — E8881 Metabolic syndrome: Secondary | ICD-10-CM

## 2023-08-11 DIAGNOSIS — D649 Anemia, unspecified: Secondary | ICD-10-CM

## 2023-08-11 DIAGNOSIS — E119 Type 2 diabetes mellitus without complications: Secondary | ICD-10-CM

## 2023-08-11 DIAGNOSIS — E1169 Type 2 diabetes mellitus with other specified complication: Secondary | ICD-10-CM

## 2023-08-11 DIAGNOSIS — Z0000 Encounter for general adult medical examination without abnormal findings: Secondary | ICD-10-CM

## 2023-08-12 LAB — COMPLETE METABOLIC PANEL WITH GFR
AG Ratio: 1.4 (calc) (ref 1.0–2.5)
ALT: 17 U/L (ref 6–29)
AST: 24 U/L (ref 10–35)
Albumin: 4 g/dL (ref 3.6–5.1)
Alkaline phosphatase (APISO): 37 U/L (ref 37–153)
BUN/Creatinine Ratio: 17 (calc) (ref 6–22)
BUN: 22 mg/dL (ref 7–25)
CO2: 20 mmol/L (ref 20–32)
Calcium: 9.6 mg/dL (ref 8.6–10.4)
Chloride: 107 mmol/L (ref 98–110)
Creat: 1.28 mg/dL — ABNORMAL HIGH (ref 0.50–1.05)
Globulin: 2.8 g/dL (ref 1.9–3.7)
Glucose, Bld: 82 mg/dL (ref 65–99)
Potassium: 4.7 mmol/L (ref 3.5–5.3)
Sodium: 142 mmol/L (ref 135–146)
Total Bilirubin: 0.5 mg/dL (ref 0.2–1.2)
Total Protein: 6.8 g/dL (ref 6.1–8.1)
eGFR: 45 mL/min/{1.73_m2} — ABNORMAL LOW (ref >=60)

## 2023-08-12 LAB — CBC WITH DIFFERENTIAL/PLATELET
Absolute Lymphocytes: 1086 {cells}/uL (ref 850–3900)
Absolute Monocytes: 348 {cells}/uL (ref 200–950)
Basophils Absolute: 52 {cells}/uL (ref 0–200)
Basophils Relative: 1.1 %
Eosinophils Absolute: 221 {cells}/uL (ref 15–500)
Eosinophils Relative: 4.7 %
HCT: 35.4 % (ref 35.0–45.0)
Hemoglobin: 11.1 g/dL — ABNORMAL LOW (ref 11.7–15.5)
MCH: 28 pg (ref 27.0–33.0)
MCHC: 31.4 g/dL — ABNORMAL LOW (ref 32.0–36.0)
MCV: 89.2 fL (ref 80.0–100.0)
MPV: 10.1 fL (ref 7.5–12.5)
Monocytes Relative: 7.4 %
Neutro Abs: 2994 {cells}/uL (ref 1500–7800)
Neutrophils Relative %: 63.7 %
Platelets: 385 10*3/uL (ref 140–400)
RBC: 3.97 10*6/uL (ref 3.80–5.10)
RDW: 14.4 % (ref 11.0–15.0)
Total Lymphocyte: 23.1 %
WBC: 4.7 10*3/uL (ref 3.8–10.8)

## 2023-08-12 LAB — LIPID PANEL
Cholesterol: 147 mg/dL (ref <200)
HDL: 75 mg/dL (ref >=50)
LDL Cholesterol (Calc): 56 mg/dL
Non-HDL Cholesterol (Calc): 72 mg/dL (ref <130)
Total CHOL/HDL Ratio: 2 (calc) (ref <5.0)
Triglycerides: 84 mg/dL (ref <150)

## 2023-08-12 LAB — MICROALBUMIN / CREATININE URINE RATIO
Creatinine, Urine: 227 mg/dL (ref 20–275)
Microalb Creat Ratio: 5 mg/g{creat} (ref <30)
Microalb, Ur: 1.2 mg/dL

## 2023-08-12 LAB — HEMOGLOBIN A1C
Hgb A1c MFr Bld: 6.2 %{Hb} — ABNORMAL HIGH (ref <5.7)
Mean Plasma Glucose: 131 mg/dL
eAG (mmol/L): 7.3 mmol/L

## 2023-08-12 LAB — TSH: TSH: 1.77 m[IU]/L (ref 0.40–4.50)

## 2023-08-15 NOTE — Progress Notes (Signed)
 Annual Medicare Wellness Visit   Patient Care Team: Daisy Lites, Luanna Cole, MD as PCP - General (Internal Medicine) Miguel Aschoff, MD (Inactive) (Obstetrics and Gynecology) Pierce Crane, MD (Inactive) (Hematology and Oncology) Margaretmary Dys, MD (Radiation Oncology) Cyndia Bent, MD (Inactive) (General Surgery) [facility], [place], [place]  Visit Date: 08/16/23   Chief Complaint  Patient presents with   Medicare Wellness   Annual Exam   Subjective:  Patient: Jill Richardson, Female DOB: 07-30-1955, 68 y.o. MRN: [id number] Jill Richardson is a 68 y.o. Female who presents today for her Annual Medicare Wellness Visit. Patient has history of Breast Cancer, Diabetes Mellitus, Dysrhythmia, Fibromyalgia, GERD, Hyperlipidemia, S/p Lumbar Fusion, Supraventricular Tachycardia, Umbilical Hernia, Anemia, Insomnia, and Dysplastic Nevus.  Reports feeling pressure in her right ear. Endorses popping and crackling. Notes she was ill a couple of weeks ago.  History of L5-S1 Fusion with cages and pedicle screws by Dr. Channing Mutters in 2005; History of Chronic Back Pain; History of L4-5 Spondylolisthesis, she is s/p PILF S1-L5, L3-4, & L4-5 on 03/16/2023. Reports she is healing generally well from this, but notes that she does start to experience soreness 5-6 hours after extended activity. Does still work a couple times/week, but she states she will likely stop working around April.   History of Essential Hypertension treated with 5 mg Amlodipine daily and 100 mg Losartan daily. Blood Pressure: normotensive today at 120/80. Wears compression stockings for varicose veins.   History of Mixed Hyperlipidemia treated with 10 mg Rosuvastatin daily and 160 mg Fenofibrate daily.08/11/2023 Lipid Panel: WNL.   History of Type 2 diabetes mellitus treated with Januvia 100 mg daily and Glucophage 500 mg BID. 08/11/2023 HgbA1c, compared to 02/10/2023: 6.2, increased from 5.9. When last seen in August 2024 discussed Dexcom 7 CGM, which  today she reports was not approved as she has not yet tried/failed Freestyle Libre CGM.  History of Fibromyalgia treated with Flexeril 10 mg three times daily as needed and Tramadol 50 mg once every 6 hours as needed. Requesting Tramadol be refilled today.   History of GERD treated with Protonix 40 mg daily.  History of Depression treated with Effexor-XR 75 mg daily with breakfast.  History of Chronic Kidney Disease stage 3. 08/11/2023 Creatinine 1.28, no change from 9/10; eGFR 45, increased slightly from 43 on 2/12; Microalb/Creatinine: WNL.   History of Anemia 08/13/2023 Hemoglobin 11.1, decreased from 11.6; MCHC 31.4, decreased from 31.9. She reports that she will start taking Ferrous Sulfate 325 mg daily now.   Labs 08/11/2023 CBC, compared to 03/08/23: Hgb 11.1, decreased from 11.6; MCHC 31.4, decreased from 31.9; Otherwise WNL CMP, compared to 03/08/2023 & 08/09/2022: Creatinine 1.28, no change from 9/10; eGFR 45, increased slightly from 43 on 2/12; Otherwise WNL.   TSH: 1.77  Pelvic exam deferred.  Mammogram 10/11/22 w/o mammographic evidence of malignancy and recommended repeat in 2025. 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 Was on estrogen replacement until her cancer diagnosis. Tumor was ER/PR positive with evidence of lymphovascular invasion. Nodes were negative. She had radiation, lumpectomy, and was treated with tamoxifen and Femara.   Colonoscopy 04/25/17 with a Benign polyp removed - one 4 mm from rectum; Otherwise normal and recommended repeat in 2028.  Bone Density 06/29/2017 T-score AP Spine -1.6, osteopenic.    Vaccine Counseling: UTD on Flu, Shingles 2/2, PNA, and Tdap. Past Medical History:  Diagnosis Date   Anemia    Breast  cancer [facility]) 2008   Chronic kidney disease    Complication of anesthesia    Diabetes mellitus without complication (HCC)    Dysrhythmia    hx SVT   Fibromyalgia     GERD (gastroesophageal reflux disease)    Hyperlipidemia    Hypertension    Personal history of radiation therapy    left breast Ca   PONV (postoperative nausea and vomiting)    S/P lumbar fusion 2005   L5-S1   SVT (supraventricular tachycardia) (HCC)    Umbilical hernia   Medical/Surgical History Narrative:  2019 - In September had a Periumbilical Hernia Repair by Dr. Corliss Skains.  2008 - hx of Dysplastic Nevus of the Shoulder   1997 - Left Oophorectomy in March. Exploratory Laparotomy for Small Bowel Obstruction in April.  1990s - hx of Fibromyalgia Syndrome  Other - hx of Right Cataract Extraction. Menarche at age 14. 2 pregnancies, 0 miscarriages. Menopause around age 27.  Family History  Problem Relation Age of Onset   Hypertension Mother    Diabetes Father    Hypertension Father        pulmonary HTN   Cancer Father    Kidney disease Father    Diabetes Sister    Hypertension Brother    Breast cancer Neg Hx    Social History   Social History Narrative   Widowed since October 2023. 2 adult sons. Has grandchildren. 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       Family history: Father with history of diabetes and hypertension died from complications of heart failure.  Mother still living.  Brother with history of hypertension.  Sister in good health.   Review of Systems  Constitutional:  Negative for fever and malaise/fatigue.  HENT:  Negative for congestion.        (+) Ear Pressure, Popping & Crackling - Right  Eyes:  Negative for blurred vision.  Respiratory:  Negative for cough and shortness of breath.   Cardiovascular:  Negative for chest pain, palpitations and leg swelling.  Gastrointestinal:  Negative for vomiting.  Musculoskeletal:  Positive for back pain.  Skin:  Negative for rash.  Neurological:  Negative for loss of consciousness and headaches.    Objective:  Vitals: BP  120/80   Pulse 81   Ht 5' 0.75" (1.543 m)   Wt 158 lb (71.7 kg)   SpO2 97%   BMI 30.10 kg/m  Physical Exam Vitals and nursing note reviewed.  Constitutional:      General: She is not in acute distress.    Appearance: Normal appearance. She is not ill-appearing or toxic-appearing.  HENT:     Head: Normocephalic and atraumatic.     Right Ear: Hearing, tympanic membrane, ear canal and external ear normal.     Left Ear: Hearing, ear canal and external ear normal.     Ears:     Comments: Right Ear: fluid noted w/ splayed light reflex, slightly dull and yellow    Mouth/Throat:     Pharynx: Oropharynx is clear.  Eyes:     Extraocular Movements: Extraocular movements intact.     Pupils: Pupils are equal, round, and reactive to light.  Neck:     Thyroid: No thyroid mass, thyromegaly or thyroid tenderness.     Vascular: No carotid bruit.  Cardiovascular:     Rate and Rhythm: Normal rate and regular rhythm. No extrasystoles are present.  Pulses:          Dorsalis pedis pulses are 1+ on the right side and 1+ on the left side.     Heart sounds: Normal heart sounds. No murmur heard.    No friction rub. No gallop.  Pulmonary:     Effort: Pulmonary effort is normal.     Breath sounds: Normal breath sounds. No decreased breath sounds, wheezing, rhonchi or rales.  Chest:     Chest wall: No mass.  Abdominal:     Palpations: Abdomen is soft. There is no hepatomegaly, splenomegaly or mass.     Tenderness: There is no abdominal tenderness.     Hernia: No hernia is present.  Musculoskeletal:     Cervical back: Normal range of motion.     Right lower leg: Edema (trace) present.     Left lower leg: Edema (trace) present.  Lymphadenopathy:     Cervical: No cervical adenopathy.     Upper Body:     Right upper body: No supraclavicular adenopathy.     Left upper body: No supraclavicular adenopathy.  Skin:    General: Skin is warm and dry.  Neurological:     General: No focal deficit  present.     Mental Status: She is alert and oriented to person, place, and time. Mental status is at baseline.     Sensory: Sensation is intact.     Motor: Motor function is intact. No weakness.     Deep Tendon Reflexes: Reflexes are normal and symmetric.  Psychiatric:        Attention and Perception: Attention normal.        Mood and Affect: Mood normal.        Speech: Speech normal.        Behavior: Behavior normal.        Thought Content: Thought content normal.        Cognition and Memory: Cognition normal.        Judgment: Judgment normal.   Most Recent Functional Status Assessment:    08/16/2023   11:11 AM  In your present state of health, do you have any difficulty performing the following activities:  Hearing? 0  Vision? 0  Difficulty concentrating or making decisions? 0  Walking or climbing stairs? 0  Dressing or bathing? 0  Doing errands, shopping? 0  Preparing Food and eating ? N  Using the Toilet? N  In the past six months, have you accidently leaked urine? N  Do you have problems with loss of bowel control? N  Managing your Medications? N  Managing your Finances? N  Housekeeping or managing your Housekeeping? N   Most Recent Fall Risk Assessment:    08/16/2023   11:09 AM  Fall Risk   Falls in the past year? 0  Number falls in past yr: 0  Injury with Fall? 0  Risk for fall due to : No Fall Risks  Follow up Falls prevention discussed;Education provided;Falls evaluation completed   Most Recent Depression Screenings:    08/16/2023   11:04 AM 08/10/2022   10:07 AM  PHQ 2/9 Scores  PHQ - 2 Score 0 0   Most Recent Cognitive Screening:    08/16/2023   11:14 AM  6CIT Screen  What Year? 0 points  What month? 0 points  What time? 0 points  Count back from 20 0 points  Months in reverse 0 points  Repeat phrase 0 points  Total Score 0 points   Results:  Studies Obtained And Personally Reviewed By Me:  Mammogram 10/11/22 w/o mammographic evidence of  malignancy.   Colonoscopy 04/25/17 with a Benign polyp removed - one 4 mm from rectum; Otherwise normal.  Bone Density 06/29/2017 T-score AP Spine -1.6, osteopenic.   Diabetic Foot Exam - Simple   Simple Foot Form Diabetic Foot exam was performed with the following findings: Yes 08/16/2023 11:30 AM  Visual Inspection No deformities, no ulcerations, no other skin breakdown bilaterally: Yes Sensation Testing Intact to touch and monofilament testing bilaterally: Yes Pulse Check Posterior Tibialis and Dorsalis pulse intact bilaterally: Yes Comments    Labs:     Component Value Date/Time   NA 142 08/11/2023 0945   NA 140 09/08/2021 0000   NA 144 05/14/2014 0822   K 4.7 08/11/2023 0945   K 4.3 05/14/2014 0822   CL 107 08/11/2023 0945   CO2 20 08/11/2023 0945   CO2 26 05/14/2014 0822   GLUCOSE 82 08/11/2023 0945   GLUCOSE 147 (H) 05/14/2014 0822   BUN 22 08/11/2023 0945   BUN 34 (A) 09/08/2021 0000   BUN 23.3 05/14/2014 0822   CREATININE 1.28 (H) 08/11/2023 0945   CREATININE 1.0 05/14/2014 0822   CALCIUM 9.6 08/11/2023 0945   CALCIUM 9.9 05/14/2014 0822   PROT 6.8 08/11/2023 0945   PROT 6.8 05/14/2014 0822   ALBUMIN 3.8 09/08/2022 1022   ALBUMIN 3.4 (L) 05/14/2014 0822   AST 24 08/11/2023 0945   AST 25 09/08/2022 1022   AST 23 05/14/2014 0822   ALT 17 08/11/2023 0945   ALT 15 09/08/2022 1022   ALT 28 05/14/2014 0822   ALKPHOS 33 (L) 09/08/2022 1022   ALKPHOS 28 (L) 05/14/2014 0822   BILITOT 0.5 08/11/2023 0945   BILITOT 0.5 09/08/2022 1022   BILITOT 0.34 05/14/2014 0822   GFRNONAA 45 (L) 03/08/2023 1319   GFRNONAA 48 (L) 09/08/2022 1022   GFRNONAA 55 (L) 07/31/2020 1118   GFRAA 63 07/31/2020 1118    Lab Results  Component Value Date   WBC 4.7 08/11/2023   HGB 11.1 (L) 08/11/2023   HCT 35.4 08/11/2023   MCV 89.2 08/11/2023   PLT 385 08/11/2023   Lab Results  Component Value Date   CHOL 147 08/11/2023   HDL 75 08/11/2023   LDLCALC 56 08/11/2023   TRIG 84  08/11/2023   CHOLHDL 2.0 08/11/2023   Lab Results  Component Value Date   HGBA1C 6.2 (H) 08/11/2023    Lab Results  Component Value Date   TSH 1.77 08/11/2023    Assessment & Plan:  No orders of the defined types were placed in this encounter.  Other Labs Reviewed today: CBC, compared to 03/08/23: Hgb 11.1, decreased from 11.6; MCHC 31.4, decreased from 31.9; Otherwise WNL CMP, compared to 03/08/2023 & 08/09/2022: Creatinine 1.28, no change from 9/10; eGFR 45, increased slightly from 43 on 2/12; Otherwise WNL.   TSH: 1.77  Ear Pressure, Right: notes she was ill a couple of weeks ago. On exam fluid noted w/ splayed light reflex, slightly dull and yellow in right ear.    S/p PILF S1-L5, L3-4, & L4-5 on 03/16/2023. Reports she is healing generally well from this, but notes that she does start to experience soreness 5-6 hours after extended activity. Does still work a couple times/week, but she states she will likely stop working around April.   Essential Hypertension treated with 5 mg Amlodipine daily and 100 mg Losartan daily. Blood Pressure: normotensive today at 120/80. Wears compression stockings  for varicose veins.   Mixed Hyperlipidemia treated with 10 mg Rosuvastatin daily and 160 mg Fenofibrate daily.08/11/2023 Lipid Panel: WNL.   Diabetes Mellitus, type II treated with Januvia 100 mg daily and Glucophage 500 mg BID. 08/11/2023 HgbA1c, compared to 02/10/2023: 6.2, increased from 5.9. When last seen in August 2024 discussed Dexcom 7 CGM, which today she reports was not approved as she has not yet tried/failed Freestyle Libre CGM. Provided prescription for Freestyle Libre CGM for prior authorization.   Fibromyalgia treated with Flexeril 10 mg three times daily as needed and Tramadol 50 mg once every 6 hours as needed. Requesting Tramadol be refilled today.   GERD treated with Protonix 40 mg daily.  Depression treated with Effexor-XR 75 mg daily with breakfast.  Chronic Kidney Disease  stage 3b  08/11/2023 Creatinine 1.28, no change from 9/10; eGFR 45, increased slightly from 43 on 2/12; Microalb/Creatinine: WNL.   Anemia 08/13/2023 Hemoglobin 11.1, decreased from 11.6; MCHC 31.4, decreased from 31.9. She reports that she will start taking Ferrous Sulfate 325 mg daily now.  Mammogram 10/11/22 w/o mammographic evidence of malignancy and recommended repeat in 2025.   Colonoscopy 04/25/17 with a Benign polyp removed - one 4 mm from rectum; Otherwise normal and recommended repeat in 2028.  Bone Density 06/29/2017 T-score AP Spine -1.6, osteopenic.    Vaccine Counseling: UTD on Flu, Shingles 2/2, PNA, and Tdap.   Annual wellness visit done today including the all of the following: Reviewed patient's Family Medical History Reviewed and updated list of patient's medical providers Assessment of cognitive impairment was done Assessed patient's functional ability Established a written schedule for health screening services Health Risk Assessent Completed and Reviewed  Discussed health benefits of physical activity, and encouraged her to engage in regular exercise appropriate for her age and condition.    I,Emily Lagle,acting as a Neurosurgeon for [name], MD.,have documented all relevant documentation on the behalf of [name], MD,as directed by  [name], MD while in the presence of [name], MD.   I, [name], MD, have reviewed all documentation for this visit. The documentation on 08/24/23 for the exam, diagnosis, procedures, and orders are all accurate and complete.

## 2023-08-16 ENCOUNTER — Other Ambulatory Visit: Payer: Self-pay

## 2023-08-16 ENCOUNTER — Telehealth: Payer: Self-pay | Admitting: Internal Medicine

## 2023-08-16 ENCOUNTER — Ambulatory Visit (INDEPENDENT_AMBULATORY_CARE_PROVIDER_SITE_OTHER): Payer: PPO | Admitting: Internal Medicine

## 2023-08-16 ENCOUNTER — Other Ambulatory Visit (HOSPITAL_COMMUNITY): Payer: Self-pay

## 2023-08-16 ENCOUNTER — Encounter: Payer: Self-pay | Admitting: Internal Medicine

## 2023-08-16 VITALS — BP 120/80 | HR 81 | Ht 60.75 in | Wt 158.0 lb

## 2023-08-16 DIAGNOSIS — I10 Essential (primary) hypertension: Secondary | ICD-10-CM | POA: Diagnosis not present

## 2023-08-16 DIAGNOSIS — Z6830 Body mass index (BMI) 30.0-30.9, adult: Secondary | ICD-10-CM

## 2023-08-16 DIAGNOSIS — M7918 Myalgia, other site: Secondary | ICD-10-CM

## 2023-08-16 DIAGNOSIS — F5101 Primary insomnia: Secondary | ICD-10-CM

## 2023-08-16 DIAGNOSIS — F324 Major depressive disorder, single episode, in partial remission: Secondary | ICD-10-CM

## 2023-08-16 DIAGNOSIS — R768 Other specified abnormal immunological findings in serum: Secondary | ICD-10-CM | POA: Diagnosis not present

## 2023-08-16 DIAGNOSIS — M2550 Pain in unspecified joint: Secondary | ICD-10-CM

## 2023-08-16 DIAGNOSIS — Z0000 Encounter for general adult medical examination without abnormal findings: Secondary | ICD-10-CM

## 2023-08-16 DIAGNOSIS — E1169 Type 2 diabetes mellitus with other specified complication: Secondary | ICD-10-CM | POA: Diagnosis not present

## 2023-08-16 DIAGNOSIS — G8929 Other chronic pain: Secondary | ICD-10-CM

## 2023-08-16 DIAGNOSIS — E785 Hyperlipidemia, unspecified: Secondary | ICD-10-CM

## 2023-08-16 DIAGNOSIS — M797 Fibromyalgia: Secondary | ICD-10-CM

## 2023-08-16 DIAGNOSIS — N1832 Chronic kidney disease, stage 3b: Secondary | ICD-10-CM

## 2023-08-16 DIAGNOSIS — Z853 Personal history of malignant neoplasm of breast: Secondary | ICD-10-CM

## 2023-08-16 DIAGNOSIS — Z8739 Personal history of other diseases of the musculoskeletal system and connective tissue: Secondary | ICD-10-CM

## 2023-08-16 DIAGNOSIS — K219 Gastro-esophageal reflux disease without esophagitis: Secondary | ICD-10-CM

## 2023-08-16 DIAGNOSIS — Z9889 Other specified postprocedural states: Secondary | ICD-10-CM

## 2023-08-16 DIAGNOSIS — Z1589 Genetic susceptibility to other disease: Secondary | ICD-10-CM

## 2023-08-16 MED ORDER — TRAMADOL HCL 50 MG PO TABS
50.0000 mg | ORAL_TABLET | Freq: Four times a day (QID) | ORAL | 0 refills | Status: AC | PRN
Start: 2023-08-16 — End: ?
  Filled 2023-08-16 (×2): qty 20, 5d supply, fill #0

## 2023-08-16 MED ORDER — FREESTYLE LITE W/DEVICE KIT
PACK | 0 refills | Status: AC
Start: 2023-08-16 — End: ?
  Filled 2023-08-16: qty 1, 30d supply, fill #0
  Filled 2023-08-17: qty 1, 1d supply, fill #0
  Filled 2023-10-26: qty 1, 90d supply, fill #0

## 2023-08-16 MED ORDER — AZITHROMYCIN 250 MG PO TABS
ORAL_TABLET | ORAL | 0 refills | Status: AC
Start: 2023-08-16 — End: 2023-08-22
  Filled 2023-08-16: qty 6, 5d supply, fill #0

## 2023-08-16 NOTE — Telephone Encounter (Signed)
Yes and I put in a referral to [facility] to help with Free style [place]

## 2023-08-16 NOTE — Telephone Encounter (Signed)
Jill Richardson came back by the office and said she thought she was to get a Z pack also today with her medications.

## 2023-08-17 ENCOUNTER — Other Ambulatory Visit (HOSPITAL_COMMUNITY): Payer: Self-pay

## 2023-08-18 ENCOUNTER — Other Ambulatory Visit: Payer: Self-pay

## 2023-08-18 ENCOUNTER — Other Ambulatory Visit (HOSPITAL_COMMUNITY): Payer: Self-pay

## 2023-08-22 ENCOUNTER — Telehealth: Payer: Self-pay

## 2023-08-22 NOTE — Progress Notes (Signed)
 Care Guide Pharmacy Note  08/22/2023 Name: [name] MRN: [id number] DOB: 17-May-1956  Referred By: [name], MD Reason for referral: Care Coordination (Outreach to schedule with Pharm d )   Jill Richardson is a 68 y.o. year old female who is a primary care patient of Lenord Fellers, Luanna Cole, MD.  WYNETTE JERSEY was referred to the pharmacist for assistance related to: DMII  Successful contact was made with the patient to discuss pharmacy services including being ready for the pharmacist to call at least 5 minutes before the scheduled appointment time and to have medication bottles and any blood pressure readings ready for review. The patient agreed to meet with the pharmacist via telephone visit on (date/time).08/26/2023  Penne Lash , RMA     [place]  [facility], [facility] Guide  Direct Dial: [phone]  Website: [email or web address]

## 2023-08-23 ENCOUNTER — Other Ambulatory Visit: Payer: Self-pay

## 2023-08-23 DIAGNOSIS — E119 Type 2 diabetes mellitus without complications: Secondary | ICD-10-CM

## 2023-08-24 ENCOUNTER — Encounter: Payer: Self-pay | Admitting: Internal Medicine

## 2023-08-24 NOTE — Patient Instructions (Addendum)
 We are pleased you are doing well. Labs are stable. Return in 6 months or as needed. Have sent in Tramadol and a Z-pak as requested.

## 2023-08-26 ENCOUNTER — Other Ambulatory Visit: Payer: Self-pay

## 2023-08-26 ENCOUNTER — Other Ambulatory Visit: Payer: Self-pay | Admitting: Pharmacist

## 2023-08-26 DIAGNOSIS — E119 Type 2 diabetes mellitus without complications: Secondary | ICD-10-CM

## 2023-08-26 MED ORDER — FREESTYLE LIBRE 3 PLUS SENSOR MISC
11 refills | Status: AC
  Filled 2023-08-26: qty 2, 30d supply, fill #0
  Filled 2023-10-03: qty 2, 30d supply, fill #1
  Filled 2023-10-28: qty 2, 30d supply, fill #2
  Filled 2023-12-04: qty 2, 30d supply, fill #3
  Filled 2023-12-28: qty 2, 30d supply, fill #4
  Filled 2024-01-30: qty 2, 30d supply, fill #5
  Filled 2024-03-01: qty 2, 30d supply, fill #6
  Filled 2024-04-03: qty 2, 30d supply, fill #7
  Filled 2024-04-30: qty 2, 30d supply, fill #8
  Filled 2024-05-30: qty 2, 30d supply, fill #9
  Filled 2024-06-28: qty 2, 30d supply, fill #10

## 2023-08-26 NOTE — Progress Notes (Signed)
   08/26/2023  Patient ID: Jill Richardson, female   DOB: 1956/01/04, 68 y.o.   MRN: [id number]  Called and spoke with the patient on the phone regarding referral for [facility] sensors.  Appears pharmacy just needed a script for [place] sensors. PA was approved for device/reader showing $98 with insurance, which is a 1-time fee.  Patient would like to just get sensors each month mailed to her for free currently. Advised we can do that. Will verify pricing with the pharmacy and shipped out by Monday appropriately (also check if PA is needed).   Marlowe Aschoff, PharmD [facility] Phone Number: [phone]

## 2023-09-05 ENCOUNTER — Other Ambulatory Visit: Payer: Self-pay

## 2023-09-05 ENCOUNTER — Other Ambulatory Visit (HOSPITAL_COMMUNITY): Payer: Self-pay

## 2023-09-05 ENCOUNTER — Other Ambulatory Visit: Payer: Self-pay | Admitting: Internal Medicine

## 2023-09-05 MED ORDER — AMLODIPINE BESYLATE 5 MG PO TABS
5.0000 mg | ORAL_TABLET | Freq: Every day | ORAL | 3 refills | Status: AC
  Filled 2023-09-05: qty 90, 90d supply, fill #0
  Filled 2023-12-14: qty 90, 90d supply, fill #1
  Filled 2024-03-09: qty 90, 90d supply, fill #2
  Filled 2024-06-06: qty 90, 90d supply, fill #3

## 2023-09-05 MED ORDER — ROSUVASTATIN CALCIUM 10 MG PO TABS
10.0000 mg | ORAL_TABLET | Freq: Every day | ORAL | 3 refills | Status: AC
  Filled 2023-09-05: qty 90, 90d supply, fill #0
  Filled 2023-12-14: qty 90, 90d supply, fill #1
  Filled 2024-03-09: qty 90, 90d supply, fill #2
  Filled 2024-06-06: qty 90, 90d supply, fill #3

## 2023-09-06 ENCOUNTER — Other Ambulatory Visit: Payer: Self-pay

## 2023-09-07 ENCOUNTER — Other Ambulatory Visit: Payer: Self-pay | Admitting: Oncology

## 2023-09-07 DIAGNOSIS — D649 Anemia, unspecified: Secondary | ICD-10-CM

## 2023-09-07 NOTE — Progress Notes (Unsigned)
 [facility]  [address] [place],  [place]  [address] [phone]  Clinic Day:  09/08/2023  Referring physician: [name], MD   HISTORY OF PRESENT ILLNESS:  The patient is a 68 y.o. female with anemia secondary to chronic kidney disease.  Despite this diagnosis, her hemoglobin has not been particularly low to where red cell shot therapy has been necessary.  She comes in today for routine follow-up.  Since her last visit, the patient has been feeling well.  She denies having increased fatigue or any overt forms of blood loss which concern her for progressive anemia.   PHYSICAL EXAM:  Blood pressure 132/80, pulse 88, temperature 99.3 F (37.4 C), temperature source Oral, resp. rate 14, height 5' 0.7" (1.542 m), weight 159 lb 3.2 oz (72.2 kg), SpO2 98%. Wt Readings from Last 3 Encounters:  09/08/23 159 lb 3.2 oz (72.2 kg)  08/16/23 158 lb (71.7 kg)  03/16/23 155 lb (70.3 kg)   Body mass index is 30.38 kg/m. Performance status (ECOG): 0 - Asymptomatic Physical Exam Constitutional:      Appearance: Normal appearance. She is not ill-appearing.  HENT:     Mouth/Throat:     Mouth: Mucous membranes are moist.     Pharynx: Oropharynx is clear. No oropharyngeal exudate or posterior oropharyngeal erythema.  Cardiovascular:     Rate and Rhythm: Normal rate and regular rhythm.     Heart sounds: No murmur heard.    No friction rub. No gallop.  Pulmonary:     Effort: Pulmonary effort is normal. No respiratory distress.     Breath sounds: Normal breath sounds. No wheezing, rhonchi or rales.  Abdominal:     General: Bowel sounds are normal. There is no distension.     Palpations: Abdomen is soft. There is no mass.     Tenderness: There is no abdominal tenderness.  Musculoskeletal:        General: No swelling.     Right lower leg: No edema.     Left lower leg: No edema.  Lymphadenopathy:     Cervical: No cervical adenopathy.     Upper Body:      Right upper body: No supraclavicular or axillary adenopathy.     Left upper body: No supraclavicular or axillary adenopathy.     Lower Body: No right inguinal adenopathy. No left inguinal adenopathy.  Skin:    General: Skin is warm.     Coloration: Skin is not jaundiced.     Findings: No lesion or rash.  Neurological:     General: No focal deficit present.     Mental Status: She is alert and oriented to person, place, and time. Mental status is at baseline.  Psychiatric:        Mood and Affect: Mood normal.        Behavior: Behavior normal.        Thought Content: Thought content normal.    LABS:      Latest Ref Rng & Units 09/08/2023   10:20 AM 08/11/2023    9:45 AM 03/08/2023    1:19 PM  CBC  WBC 4.0 - 10.5 K/uL 4.7  4.7  7.1   Hemoglobin 12.0 - 15.0 g/dL 95.2  84.1  32.4   Hematocrit 36.0 - 46.0 % 33.1  35.4  36.4   Platelets 150 - 400 K/uL 322  385  331       Latest Ref Rng & Units 09/08/2023   10:20  AM 08/11/2023    9:45 AM 03/08/2023    1:19 PM  CMP  Glucose 70 - 99 mg/dL 425  82  80   BUN 8 - 23 mg/dL 28  22  30    Creatinine 0.44 - 1.00 mg/dL 9.56  3.87  5.64   Sodium 135 - 145 mmol/L 140  142  135   Potassium 3.5 - 5.1 mmol/L 4.1  4.7  4.2   Chloride 98 - 111 mmol/L 105  107  104   CO2 22 - 32 mmol/L 23  20  23    Calcium 8.9 - 10.3 mg/dL 9.7  9.6  9.2   Total Protein 6.5 - 8.1 g/dL 6.7  6.8    Total Bilirubin 0.0 - 1.2 mg/dL 0.4  0.5    Alkaline Phos 38 - 126 U/L 43     AST 15 - 41 U/L 26  24    ALT 0 - 44 U/L 13  17      Latest Reference Range & Units 09/08/23 10:20  Iron 28 - 170 ug/dL 78  UIBC ug/dL 332  TIBC 951 - 884 ug/dL 166 (H)  Saturation Ratios 10.4 - 31.8 % 14  Ferritin 11 - 307 ng/mL 11  (H): Data is abnormally high  ASSESSMENT & PLAN:  A 68 y.o. female with anemia secondary to chronic renal insufficiency.  I am pleased that her hemoglobin remains above 10.  However, her iron parameters today show that she is on the precipice of needing IV iron.   I would have no problem with her taking oral iron on a daily basis to refortify her iron stores and improve her hemoglobin.  As her hemoglobin is well above 10, red cell shot therapy is not warranted.  Per the patient's request, I will see her back in 1 year for repeat clinical assessment.  However, she knows to contact our office before then if she has increased fatigue, blood loss, or other symptoms that concern her for having worsening anemia.   The patient understands all the plans discussed today and is in agreement with them.  Stuti Sandin Kirby Funk, MD

## 2023-09-08 ENCOUNTER — Inpatient Hospital Stay: Payer: PPO | Attending: Oncology | Admitting: Oncology

## 2023-09-08 ENCOUNTER — Telehealth: Payer: Self-pay | Admitting: Oncology

## 2023-09-08 ENCOUNTER — Inpatient Hospital Stay: Payer: PPO

## 2023-09-08 ENCOUNTER — Other Ambulatory Visit: Payer: Self-pay

## 2023-09-08 ENCOUNTER — Other Ambulatory Visit (HOSPITAL_COMMUNITY): Payer: Self-pay

## 2023-09-08 ENCOUNTER — Other Ambulatory Visit: Payer: Self-pay | Admitting: Oncology

## 2023-09-08 VITALS — BP 132/80 | HR 88 | Temp 99.3°F | Resp 14 | Ht 60.7 in | Wt 159.2 lb

## 2023-09-08 DIAGNOSIS — N189 Chronic kidney disease, unspecified: Secondary | ICD-10-CM | POA: Insufficient documentation

## 2023-09-08 DIAGNOSIS — D631 Anemia in chronic kidney disease: Secondary | ICD-10-CM | POA: Diagnosis present

## 2023-09-08 DIAGNOSIS — D649 Anemia, unspecified: Secondary | ICD-10-CM

## 2023-09-08 LAB — CBC WITH DIFFERENTIAL (CANCER CENTER ONLY)
Abs Immature Granulocytes: 0.02 10*3/uL (ref 0.00–0.07)
Basophils Absolute: 0.1 10*3/uL (ref 0.0–0.1)
Basophils Relative: 1 %
Eosinophils Absolute: 0.2 10*3/uL (ref 0.0–0.5)
Eosinophils Relative: 4 %
HCT: 33.1 % — ABNORMAL LOW (ref 36.0–46.0)
Hemoglobin: 10.7 g/dL — ABNORMAL LOW (ref 12.0–15.0)
Immature Granulocytes: 0 %
Lymphocytes Relative: 21 %
Lymphs Abs: 1 10*3/uL (ref 0.7–4.0)
MCH: 28.3 pg (ref 26.0–34.0)
MCHC: 32.3 g/dL (ref 30.0–36.0)
MCV: 87.6 fL (ref 80.0–100.0)
Monocytes Absolute: 0.4 10*3/uL (ref 0.1–1.0)
Monocytes Relative: 9 %
Neutro Abs: 3.1 10*3/uL (ref 1.7–7.7)
Neutrophils Relative %: 65 %
Platelet Count: 322 10*3/uL (ref 150–400)
RBC: 3.78 MIL/uL — ABNORMAL LOW (ref 3.87–5.11)
RDW: 17.5 % — ABNORMAL HIGH (ref 11.5–15.5)
WBC Count: 4.7 10*3/uL (ref 4.0–10.5)
nRBC: 0 % (ref 0.0–0.2)
nRBC: 0 /100{WBCs}

## 2023-09-08 LAB — CMP (CANCER CENTER ONLY)
ALT: 13 U/L (ref 0–44)
AST: 26 U/L (ref 15–41)
Albumin: 4 g/dL (ref 3.5–5.0)
Alkaline Phosphatase: 43 U/L (ref 38–126)
Anion gap: 12 (ref 5–15)
BUN: 28 mg/dL — ABNORMAL HIGH (ref 8–23)
CO2: 23 mmol/L (ref 22–32)
Calcium: 9.7 mg/dL (ref 8.9–10.3)
Chloride: 105 mmol/L (ref 98–111)
Creatinine: 1.31 mg/dL — ABNORMAL HIGH (ref 0.44–1.00)
GFR, Estimated: 44 mL/min — ABNORMAL LOW (ref >60)
Glucose, Bld: 114 mg/dL — ABNORMAL HIGH (ref 70–99)
Potassium: 4.1 mmol/L (ref 3.5–5.1)
Sodium: 140 mmol/L (ref 135–145)
Total Bilirubin: 0.4 mg/dL (ref 0.0–1.2)
Total Protein: 6.7 g/dL (ref 6.5–8.1)

## 2023-09-08 LAB — IRON AND TIBC
Iron: 78 ug/dL (ref 28–170)
Saturation Ratios: 14 % (ref 10.4–31.8)
TIBC: 556 ug/dL — ABNORMAL HIGH (ref 250–450)
UIBC: 478 ug/dL

## 2023-09-08 LAB — FERRITIN: Ferritin: 11 ng/mL (ref 11–307)

## 2023-09-08 NOTE — Telephone Encounter (Signed)
 Patient has been scheduled for follow-up visit per 09/08/23 LOS.  Pt given an appt calendar with date and time.

## 2023-09-09 ENCOUNTER — Telehealth: Payer: Self-pay

## 2023-09-09 NOTE — Telephone Encounter (Signed)
 Dr Melvyn Neth: let pt know she is very very close to being definitively iron deficient. She can either 1) take an iron pill daily or 2) come in for iv iron. let me know what she decides. thx   Lateka Rady,RN: Pt states she already takes 1 iron pill/day.

## 2023-09-19 ENCOUNTER — Other Ambulatory Visit: Payer: Self-pay | Admitting: Internal Medicine

## 2023-09-19 ENCOUNTER — Other Ambulatory Visit (HOSPITAL_COMMUNITY): Payer: Self-pay

## 2023-09-19 DIAGNOSIS — Z1231 Encounter for screening mammogram for malignant neoplasm of breast: Secondary | ICD-10-CM

## 2023-10-04 ENCOUNTER — Other Ambulatory Visit (HOSPITAL_COMMUNITY): Payer: Self-pay

## 2023-10-13 ENCOUNTER — Ambulatory Visit
Admission: RE | Admit: 2023-10-13 | Discharge: 2023-10-13 | Disposition: A | Discharge status: Home / Self Care | Source: Ambulatory Visit | Attending: Internal Medicine | Admitting: Internal Medicine

## 2023-10-13 DIAGNOSIS — Z1231 Encounter for screening mammogram for malignant neoplasm of breast: Secondary | ICD-10-CM

## 2023-10-26 ENCOUNTER — Other Ambulatory Visit: Payer: Self-pay | Admitting: Internal Medicine

## 2023-10-27 ENCOUNTER — Other Ambulatory Visit (HOSPITAL_COMMUNITY): Payer: Self-pay

## 2023-10-27 ENCOUNTER — Other Ambulatory Visit: Payer: Self-pay

## 2023-10-28 ENCOUNTER — Other Ambulatory Visit: Payer: Self-pay

## 2023-10-28 ENCOUNTER — Other Ambulatory Visit (HOSPITAL_COMMUNITY): Payer: Self-pay

## 2023-10-28 DIAGNOSIS — E1122 Type 2 diabetes mellitus with diabetic chronic kidney disease: Secondary | ICD-10-CM

## 2023-10-28 MED ORDER — JANUVIA 100 MG PO TABS
100.0000 mg | ORAL_TABLET | Freq: Every day | ORAL | 1 refills | Status: DC
  Filled 2023-10-28: qty 90, 90d supply, fill #0
  Filled 2024-01-30: qty 90, 90d supply, fill #1

## 2023-10-29 ENCOUNTER — Other Ambulatory Visit: Payer: Self-pay

## 2023-11-01 ENCOUNTER — Other Ambulatory Visit (HOSPITAL_COMMUNITY): Payer: Self-pay

## 2023-12-05 ENCOUNTER — Other Ambulatory Visit: Payer: Self-pay

## 2023-12-06 ENCOUNTER — Other Ambulatory Visit (HOSPITAL_COMMUNITY): Payer: Self-pay

## 2023-12-06 MED ORDER — GABAPENTIN 300 MG PO CAPS
300.0000 mg | ORAL_CAPSULE | Freq: Every evening | ORAL | 3 refills | Status: AC
  Filled 2023-12-06: qty 90, 90d supply, fill #0
  Filled 2024-03-01: qty 90, 90d supply, fill #1
  Filled 2024-05-30: qty 90, 90d supply, fill #2

## 2023-12-14 ENCOUNTER — Other Ambulatory Visit: Payer: Self-pay

## 2023-12-25 ENCOUNTER — Other Ambulatory Visit: Payer: Self-pay | Admitting: Internal Medicine

## 2023-12-26 ENCOUNTER — Other Ambulatory Visit: Payer: Self-pay

## 2023-12-26 MED ORDER — LORAZEPAM 2 MG PO TABS
2.0000 mg | ORAL_TABLET | Freq: Every day | ORAL | 1 refills | Status: AC
  Filled 2023-12-26: qty 90, 90d supply, fill #0
  Filled 2024-04-24: qty 90, 90d supply, fill #1

## 2023-12-26 NOTE — Telephone Encounter (Signed)
 Medication: Lorazapam Directions: Take 1 tablet (2 mg total) by mouth at bedtime.  Last given: 02/15/2023 Number refills: 1 Last o/v: 08/16/2023 Follow up: Return in 6 months or as needed.  Labs: 09/08/2023

## 2023-12-29 ENCOUNTER — Other Ambulatory Visit (HOSPITAL_COMMUNITY): Payer: Self-pay

## 2024-01-02 ENCOUNTER — Ambulatory Visit: Admitting: Physician Assistant

## 2024-01-02 ENCOUNTER — Other Ambulatory Visit (INDEPENDENT_AMBULATORY_CARE_PROVIDER_SITE_OTHER): Payer: Self-pay

## 2024-01-02 ENCOUNTER — Encounter: Payer: Self-pay | Admitting: Physician Assistant

## 2024-01-02 DIAGNOSIS — M7541 Impingement syndrome of right shoulder: Secondary | ICD-10-CM | POA: Diagnosis not present

## 2024-01-02 DIAGNOSIS — G8929 Other chronic pain: Secondary | ICD-10-CM

## 2024-01-02 DIAGNOSIS — M65312 Trigger thumb, left thumb: Secondary | ICD-10-CM

## 2024-01-02 DIAGNOSIS — M25511 Pain in right shoulder: Secondary | ICD-10-CM

## 2024-01-02 MED ORDER — METHYLPREDNISOLONE ACETATE 40 MG/ML IJ SUSP
20.0000 mg | INTRAMUSCULAR | Status: AC | PRN
  Administered 2024-01-02: 20 mg

## 2024-01-02 MED ORDER — LIDOCAINE HCL 1 % IJ SOLN
0.5000 mL | INTRAMUSCULAR | Status: AC | PRN
  Administered 2024-01-02: .5 mL

## 2024-01-02 NOTE — Progress Notes (Signed)
 HPI: [name] comes in today for right shoulder pain.  She also comes into today due to bilateral thumb pain.  She is having active locking of her left thumb.  She is diabetic good control.  She also notes that her fingers are less straight than they have been in the past.  She is recovering from lumbar surgery by Dr. Onetha that was performed in September 2024.  She states her shoulders been bothering her for the last couple of months.  No new injury.  No radicular symptoms.  Last subacromial injection 01/20/2023 gave her good results.  Review of systems denies fevers chills.  Physical exam: General Well-developed well-nourished female no acute distress mood and affect appropriate Bilateral thumbs negative tenderness over the first extensor compartment.  Negative grind test bilaterally.  She has palpable nodules at the A1 pulley bilateral thumbs.  Left slightly more tender than the right.  Active triggering of the left thumb. Bilateral shoulders: 5-5 strength with external/internal rotation against resistance.  Empty can test is negative bilaterally.  Liftoff test negative bilaterally.  Slight tenderness over the right AC joint.  Radiographs: Right shoulder 3 views: Shoulder is overall well preserved.  Mild to moderate AC joint changes.  No acute fractures acute findings shoulders are located.  Impression: Chronic right shoulder pain Left trigger thumb  Plan: Offered her an injection for the left trigger thumb she is agreeable.  Regards to the shoulder we will send her to formal physical therapy for range of motion strengthening home exercise program and modalities.  She will follow-up with Dr. Erwin in regards to her bilateral hand concerns.  Questions were encouraged and answered at length.  She will follow-up with Dr. Vernetta in 6 weeks for her right shoulder.  Questions were encouraged and answered.    Procedure Note  Patient: Jill Richardson             Date of Birth: [birth year]           MRN:  [id number]             Visit Date: 01/02/2024  Procedures: Visit Diagnoses:  1. Chronic right shoulder pain   2. Impingement syndrome of right shoulder     Hand/UE Inj: L thumb A1 for trigger finger on 01/02/2024 5:08 PM Medications: 0.5 mL lidocaine  1 %; 20 mg methylPREDNISolone  acetate 40 MG/ML Consent was given by the patient. Immediately prior to procedure a time out was called to verify the correct patient, procedure, equipment, support staff and site/side marked as required. Patient was prepped and draped in the usual sterile fashion.

## 2024-01-13 ENCOUNTER — Telehealth: Payer: Self-pay | Admitting: Physician Assistant

## 2024-01-13 NOTE — Telephone Encounter (Signed)
 Pt states she does not live near an [facility] and she will need a new RX for PT to go to [facility] in [place], [place].   Pt's call back [phone]

## 2024-01-13 NOTE — Telephone Encounter (Signed)
 Faxed RX, demographics, & office note to [phone]

## 2024-01-17 ENCOUNTER — Telehealth: Payer: Self-pay | Admitting: Physician Assistant

## 2024-01-17 NOTE — Telephone Encounter (Signed)
 REFAXED

## 2024-01-17 NOTE — Telephone Encounter (Signed)
 Crystal from [facility] called and said the referral that was sent she only have the demographics. No office note. CB#[phone]

## 2024-01-23 ENCOUNTER — Other Ambulatory Visit: Payer: Self-pay | Admitting: Internal Medicine

## 2024-01-24 ENCOUNTER — Other Ambulatory Visit (HOSPITAL_COMMUNITY): Payer: Self-pay

## 2024-01-24 ENCOUNTER — Other Ambulatory Visit: Payer: Self-pay

## 2024-01-24 MED ORDER — VENLAFAXINE HCL ER 75 MG PO CP24
75.0000 mg | ORAL_CAPSULE | Freq: Every day | ORAL | 3 refills | Status: AC
  Filled 2024-01-24: qty 90, 90d supply, fill #0
  Filled 2024-04-24: qty 90, 90d supply, fill #1
  Filled 2024-07-23: qty 90, 90d supply, fill #2

## 2024-01-24 MED ORDER — FENOFIBRATE 160 MG PO TABS
160.0000 mg | ORAL_TABLET | Freq: Every day | ORAL | 3 refills | Status: AC
  Filled 2024-01-24: qty 90, 90d supply, fill #0
  Filled 2024-04-24: qty 90, 90d supply, fill #1
  Filled 2024-07-23: qty 90, 90d supply, fill #2

## 2024-01-31 ENCOUNTER — Other Ambulatory Visit (HOSPITAL_COMMUNITY): Payer: Self-pay

## 2024-02-06 ENCOUNTER — Other Ambulatory Visit (INDEPENDENT_AMBULATORY_CARE_PROVIDER_SITE_OTHER): Payer: Self-pay

## 2024-02-06 ENCOUNTER — Ambulatory Visit: Admitting: Orthopedic Surgery

## 2024-02-06 DIAGNOSIS — M180 Bilateral primary osteoarthritis of first carpometacarpal joints: Secondary | ICD-10-CM | POA: Diagnosis not present

## 2024-02-06 DIAGNOSIS — M79641 Pain in right hand: Secondary | ICD-10-CM

## 2024-02-06 DIAGNOSIS — M79642 Pain in left hand: Secondary | ICD-10-CM

## 2024-02-06 MED ORDER — LIDOCAINE HCL 1 % IJ SOLN
1.0000 mL | INTRAMUSCULAR | Status: AC | PRN
  Administered 2024-02-06 (×2): 1 mL

## 2024-02-06 MED ORDER — BETAMETHASONE SOD PHOS & ACET 6 (3-3) MG/ML IJ SUSP
6.0000 mg | INTRAMUSCULAR | Status: AC | PRN
  Administered 2024-02-06 (×2): 6 mg via INTRA_ARTICULAR

## 2024-02-06 NOTE — Progress Notes (Signed)
 Jill Richardson - 68 y.o. female MRN [id number]  Date of birth: 23-May-1956  Office Visit Note: Visit Date: 02/06/2024 PCP: Perri Ronal PARAS, MD Referred by: Perri Ronal PARAS, MD  Subjective: No chief complaint on file.  HPI: Jill Richardson is a pleasant 68 y.o. female who presents today for evaluation of bilateral thumb basilar joint pain that is been present for multiple years, worsening in nature.  Also has notable hyperextension to the IP joints of the thumbs bilaterally.  Has trialed bracing extensively without lasting relief.  She also underwent recent injection to the left thumb A1 pulley region for some triggering which has given some mild relief.  She is being seen by myself today for specific hand surgical evaluation.  Pertinent ROS were reviewed with the patient and found to be negative unless otherwise specified above in HPI.   Visit Reason: bilateral thumb basilar joint pain with associated MP hyperextension Duration of symptoms: around 1 year Hand dominance: right Occupation: retired, former ICU nurse Diabetic: Yes 6.2 Smoking: No Heart/Lung History: hypertension Blood Thinners: none  Prior Testing/EMG: none Injections (Date): left thumb A1 pulley 01/02/24 Treatments: splinting with no relief Prior Surgery: none  Assessment & Plan: Visit Diagnoses:  1. Arthritis of carpometacarpal (CMC) joint of both thumbs   2. Pain in left hand   3. Pain in right hand     Plan: Extensive discussion was had with the patient today regarding her bilateral thumb basilar joint pain.  X-rays today confirm diagnosis of ongoing bilateral thumb CMC arthritis which correlates with her clinical examination.  We also discussed the significant MCP hyperextension indicating ongoing chronicity of this condition.  We reviewed the etiology and pathophysiology of this condition as well as appropriate treatment modalities ranging from conservative to surgical.  From a conservative standpoint, we  discussed bracing, activity modification, nonsteroidal anti-inflammatory medications both oral and topical, and finally cortisone injections.  From surgical standpoint, we discussed the possibility for left thumb CMC arthroplasty and MP capsulodesis versus fusion in the future should symptoms refractory to conservative care.  I discussed the surgical treatment as well as the postop protocol in detail with her as well today for her understanding.  At this juncture, given that she has not undergone any formalized treatment, we will have her fitted for a Comfort Cool braces and will perform cortisone injection to the bilateral thumb CMC interval today for symptom relief.  Risk and benefits of the injection were discussed in detail, patient elected to proceed.  She will return in approximately 6 weeks for recheck.   Follow-up: No follow-ups on file.   Meds & Orders: No orders of the defined types were placed in this encounter.   Orders Placed This Encounter  Procedures   XR Hand Complete Left   XR Hand Complete Right     Procedures: Hand/UE Inj: bilateral thumb CMC for osteoarthritis on 02/06/2024 9:03 PM Indications: pain Details: 25 G needle Medications (Right): 1 mL lidocaine  1 %; 6 mg betamethasone  acetate-betamethasone  sodium phosphate  6 (3-3) MG/ML Medications (Left): 1 mL lidocaine  1 %; 6 mg betamethasone  acetate-betamethasone  sodium phosphate  6 (3-3) MG/ML Outcome: tolerated well, no immediate complications Procedure, treatment alternatives, risks and benefits explained, specific risks discussed.          Clinical History: No specialty comments available.  She reports that she has never smoked. She has never used smokeless tobacco.  Recent Labs    02/10/23 0907 08/11/23 0945  HGBA1C 5.9* 6.2*  Objective:   Vital Signs: There were no vitals taken for this visit.  Physical Exam  Gen: Well-appearing, in no acute distress; non-toxic CV: Regular Rate. Well-perfused. Warm.   Resp: Breathing unlabored on room air; no wheezing. Psych: Fluid speech in conversation; appropriate affect; normal thought process  Ortho Exam General: Patient is well appearing and in no distress.    Skin and Muscle: No skin changes are apparent to upper extremities.  Muscle bulk and contour normal, no signs of atrophy.      Range of Motion and Palpation Tests: Mobility is full about the elbows with flexion and extension.  Forearm supination and pronation are 85/85 bilaterally.  Wrist flexion/extension is 75/65 bilaterally.  Digital flexion and extension are full.  Thumb opposition is full to the base of the small fingers bilaterally.     No cords or nodules are palpated.  No triggering is observed.     Significant tenderness over the bilateral thumb CMC articulation is observed, positive grind for pain, positive crepitus.  MP hyperextension positive bilaterally, approximately 20 degrees.      Neurologic, Vascular, Motor: Sensation is intact to light touch in the median/radial/ulnar distributions.  Tinel's testing negative at wrist level.  Fingers pink and well perfused.  Capillary refill is brisk.     Imaging: XR Hand Complete Right Result Date: 02/06/2024 X-rays demonstrate significant degenerative changes at the thumb [facility] interval with subchondral sclerosis, osteophyte formation, associated MCP hyperextension.  XR Hand Complete Left Result Date: 02/06/2024 X-rays demonstrate significant degenerative changes at the thumb [facility] interval with subchondral sclerosis, osteophyte formation, associated MCP hyperextension.   Past Medical/Family/Surgical/Social History: Medications & Allergies reviewed per EMR, new medications updated. Patient Active Problem List   Diagnosis Date Noted   Spondylolisthesis at L4-L5 level 03/16/2023   Anemia in chronic kidney disease 09/08/2022   Essential hypertension 01/26/2019   Menopausal syndrome 10/01/2015   Depression 08/22/2012   Insomnia  08/22/2012   Metabolic syndrome 04/25/2012   Type 2 diabetes mellitus (HCC) 11/28/2011   Hyperlipidemia 12/23/2010   GE reflux 12/23/2010   Fibromyalgia 12/23/2010   Breast cancer of upper-outer quadrant of left female breast (HCC) 12/23/2010   History of supraventricular tachycardia 12/23/2010   Past Medical History:  Diagnosis Date   Anemia    Breast cancer (HCC) 2008   Chronic kidney disease    Complication of anesthesia    Diabetes mellitus without complication (HCC)    Dysrhythmia    hx SVT   Fibromyalgia    GERD (gastroesophageal reflux disease)    Hyperlipidemia    Hypertension    Personal history of radiation therapy    left breast Ca   PONV (postoperative nausea and vomiting)    S/P lumbar fusion 2005   L5-S1   SVT (supraventricular tachycardia) (HCC)    Umbilical hernia    Family History  Problem Relation Age of Onset   Hypertension Mother    Diabetes Father    Hypertension Father        pulmonary HTN   Cancer Father    Kidney disease Father    Diabetes Sister    Hypertension Brother    Breast cancer Neg Hx    Past Surgical History:  Procedure Laterality Date   BREAST LUMPECTOMY Left 2008   malignant   INSERTION OF MESH N/A 03/21/2018   Procedure: INSERTION OF MESH;  Surgeon: Belinda Cough, MD;  Location: [facility];  Service: General;  Laterality: N/A;  LMA   LUMBAR FUSION  2005   OVARY REMOVED  1997   UMBILICAL HERNIA REPAIR N/A 03/21/2018   Procedure: HERNIA REPAIR UMBILICAL ADULT WITH POSSIBLE MESH;  Surgeon: Belinda Cough, MD;  Location: [facility];  Service: General;  Laterality: N/A;  LMA   Social History   Occupational History   Not on file  Tobacco Use   Smoking status: Never   Smokeless tobacco: Never  Substance and Sexual Activity   Alcohol use: No    Alcohol/week: 0.0 standard drinks of alcohol   Drug use: No   Sexual activity: Not on file    Doniqua Saxby Afton Alderton, M.D. [facility], Hand  Surgery

## 2024-02-09 ENCOUNTER — Other Ambulatory Visit: Payer: PPO

## 2024-02-09 DIAGNOSIS — E1169 Type 2 diabetes mellitus with other specified complication: Secondary | ICD-10-CM

## 2024-02-09 DIAGNOSIS — I10 Essential (primary) hypertension: Secondary | ICD-10-CM

## 2024-02-09 DIAGNOSIS — N183 Chronic kidney disease, stage 3 unspecified: Secondary | ICD-10-CM

## 2024-02-09 DIAGNOSIS — E1122 Type 2 diabetes mellitus with diabetic chronic kidney disease: Secondary | ICD-10-CM

## 2024-02-10 LAB — HEPATIC FUNCTION PANEL
AG Ratio: 1.5 (calc) (ref 1.0–2.5)
ALT: 16 U/L (ref 6–29)
AST: 19 U/L (ref 10–35)
Albumin: 4 g/dL (ref 3.6–5.1)
Alkaline phosphatase (APISO): 34 U/L — ABNORMAL LOW (ref 37–153)
Bilirubin, Direct: 0.2 mg/dL (ref 0.0–0.2)
Globulin: 2.6 g/dL (ref 1.9–3.7)
Indirect Bilirubin: 0.3 mg/dL (ref 0.2–1.2)
Total Bilirubin: 0.5 mg/dL (ref 0.2–1.2)
Total Protein: 6.6 g/dL (ref 6.1–8.1)

## 2024-02-10 LAB — LIPID PANEL
Cholesterol: 158 mg/dL (ref <200)
HDL: 75 mg/dL (ref >=50)
LDL Cholesterol (Calc): 65 mg/dL
Non-HDL Cholesterol (Calc): 83 mg/dL (ref <130)
Total CHOL/HDL Ratio: 2.1 (calc) (ref <5.0)
Triglycerides: 98 mg/dL (ref <150)

## 2024-02-10 LAB — MICROALBUMIN / CREATININE URINE RATIO
Creatinine, Urine: 192 mg/dL (ref 20–275)
Microalb Creat Ratio: 3 mg/g{creat} (ref <30)
Microalb, Ur: 0.6 mg/dL

## 2024-02-10 LAB — HEMOGLOBIN A1C
Hgb A1c MFr Bld: 5.9 % — ABNORMAL HIGH (ref <5.7)
Mean Plasma Glucose: 123 mg/dL
eAG (mmol/L): 6.8 mmol/L

## 2024-02-14 ENCOUNTER — Other Ambulatory Visit (HOSPITAL_COMMUNITY): Payer: Self-pay

## 2024-02-14 ENCOUNTER — Ambulatory Visit (INDEPENDENT_AMBULATORY_CARE_PROVIDER_SITE_OTHER): Payer: PPO | Admitting: Internal Medicine

## 2024-02-14 ENCOUNTER — Encounter: Payer: Self-pay | Admitting: Internal Medicine

## 2024-02-14 VITALS — BP 128/80 | HR 78 | Ht 60.75 in | Wt 160.0 lb

## 2024-02-14 DIAGNOSIS — N1832 Chronic kidney disease, stage 3b: Secondary | ICD-10-CM

## 2024-02-14 DIAGNOSIS — Z1589 Genetic susceptibility to other disease: Secondary | ICD-10-CM

## 2024-02-14 DIAGNOSIS — R7989 Other specified abnormal findings of blood chemistry: Secondary | ICD-10-CM

## 2024-02-14 DIAGNOSIS — I10 Essential (primary) hypertension: Secondary | ICD-10-CM

## 2024-02-14 DIAGNOSIS — K219 Gastro-esophageal reflux disease without esophagitis: Secondary | ICD-10-CM

## 2024-02-14 DIAGNOSIS — Z7984 Long term (current) use of oral hypoglycemic drugs: Secondary | ICD-10-CM

## 2024-02-14 DIAGNOSIS — E1122 Type 2 diabetes mellitus with diabetic chronic kidney disease: Secondary | ICD-10-CM

## 2024-02-14 DIAGNOSIS — E11649 Type 2 diabetes mellitus with hypoglycemia without coma: Secondary | ICD-10-CM | POA: Diagnosis not present

## 2024-02-14 DIAGNOSIS — Z8739 Personal history of other diseases of the musculoskeletal system and connective tissue: Secondary | ICD-10-CM

## 2024-02-14 DIAGNOSIS — E785 Hyperlipidemia, unspecified: Secondary | ICD-10-CM

## 2024-02-14 DIAGNOSIS — Z853 Personal history of malignant neoplasm of breast: Secondary | ICD-10-CM

## 2024-02-14 DIAGNOSIS — M7918 Myalgia, other site: Secondary | ICD-10-CM

## 2024-02-14 DIAGNOSIS — F324 Major depressive disorder, single episode, in partial remission: Secondary | ICD-10-CM

## 2024-02-14 DIAGNOSIS — F5101 Primary insomnia: Secondary | ICD-10-CM

## 2024-02-14 DIAGNOSIS — M797 Fibromyalgia: Secondary | ICD-10-CM

## 2024-02-14 DIAGNOSIS — I129 Hypertensive chronic kidney disease with stage 1 through stage 4 chronic kidney disease, or unspecified chronic kidney disease: Secondary | ICD-10-CM | POA: Diagnosis not present

## 2024-02-14 DIAGNOSIS — R768 Other specified abnormal immunological findings in serum: Secondary | ICD-10-CM

## 2024-02-14 LAB — CBC
HCT: 38.7 % (ref 35.0–45.0)
Hemoglobin: 12.1 g/dL (ref 11.7–15.5)
MCH: 29.6 pg (ref 27.0–33.0)
MCHC: 31.3 g/dL — ABNORMAL LOW (ref 32.0–36.0)
MCV: 94.6 fL (ref 80.0–100.0)
MPV: 9.7 fL (ref 7.5–12.5)
Platelets: 386 Thousand/uL (ref 140–400)
RBC: 4.09 Million/uL (ref 3.80–5.10)
RDW: 14 % (ref 11.0–15.0)
WBC: 7 Thousand/uL (ref 3.8–10.8)

## 2024-02-14 MED ORDER — LOSARTAN POTASSIUM 100 MG PO TABS
100.0000 mg | ORAL_TABLET | Freq: Every day | ORAL | 3 refills | Status: AC
  Filled 2024-02-14 – 2024-04-30 (×2): qty 90, 90d supply, fill #0

## 2024-02-14 MED ORDER — JANUVIA 100 MG PO TABS
100.0000 mg | ORAL_TABLET | Freq: Every day | ORAL | 1 refills | Status: DC
  Filled 2024-02-14 – 2024-05-30 (×2): qty 90, 90d supply, fill #0

## 2024-02-14 MED ORDER — METFORMIN HCL 500 MG PO TABS
500.0000 mg | ORAL_TABLET | Freq: Two times a day (BID) | ORAL | 3 refills | Status: DC
  Filled 2024-02-14 – 2024-05-30 (×2): qty 180, 90d supply, fill #0

## 2024-02-14 MED ORDER — VALACYCLOVIR HCL 500 MG PO TABS
500.0000 mg | ORAL_TABLET | Freq: Every day | ORAL | 3 refills | Status: AC
  Filled 2024-02-14 – 2024-02-20 (×2): qty 90, 90d supply, fill #0
  Filled 2024-06-12: qty 90, 90d supply, fill #1

## 2024-02-14 NOTE — Progress Notes (Signed)
 Patient Care Team: Perri Ronal PARAS, MD as PCP - General (Internal Medicine) Okey Arch, MD (Inactive) (Obstetrics and Gynecology) Melodye Coy, MD (Inactive) (Hematology and Oncology) Patrcia Cough, MD (Radiation Oncology) Merrilyn Handler, MD (Inactive) (General Surgery) [facility], [place], [place]  Visit Date: 02/14/24  Subjective:   Chief Complaint  Patient presents with   Diabetes   Hyperlipidemia   Hypertension   Patient PI:Jill Richardson,Female DOB:1956-06-26,68 y.o. FMW:[id number]   68 y.o.Female presents today for 6 months follow-up for Hypertension; Hyperlipidemia; Diabetes Mellitus, type II. Patient has a past medical history of Fibromyalgia; Spondylolisthesis L4-5. Seen for annual visit on 08/16/2023, in the interim has been seen at [facility] On[facility], and Orthopedics.  History of Hypertension treated with Amlodipine  5 mg daily and Losartan  100 mg daily; Blood Pressure: normotensive today at 128/80.   History of Hyperlipidemia treated with Fenofibrate  160 mg daily and Rosuvastatin  10 mg daily. 02/09/2024 Lipid Panel: WNL.  History of Diabetes Mellitus, type II treated with Januvia  100 mg daily, Metformin  500 mg twice daily.  02/09/2024 HgbA1c 5.9, decreased from 6.2 in February 2025, no change from 01/2023; and Albumin WNL at 0.6. Eye exam 04/22/2023 at [facility] did not detect diabetic retinopathy. Since February, when she weighed 158 lbs, she has gained 2 pounds and today weighs 160 lbsBMI 30.48. Has Freestyle Libre CGM for monitoring glucose, and she notes that recently her BGs have been lowering into the 60s at night.  History of Chronic Kidney Disease followed by [facility], who she last saw 04/25/2023. Does have associated anemia due to CKD.  History of Musculoskeletal Pain currently including right shoulder, which she is being seen at PT for and bilateral CMC joints in thumbs, which have been injected and she is  wearing bilateral thumb splints for. Scheduled to see Dr. Vernetta, Orthopedist tomorrow.  Vaccine Counseling: discussed Influenza, which she is agreeable to updating this fall. Past Medical History:  Diagnosis Date   Anemia    Breast cancer (HCC) 2008   Chronic kidney disease    Complication of anesthesia    Diabetes mellitus without complication (HCC)    Dysrhythmia    hx SVT   Fibromyalgia    GERD (gastroesophageal reflux disease)    Hyperlipidemia    Hypertension    Personal history of radiation therapy    left breast Ca   PONV (postoperative nausea and vomiting)    S/P lumbar fusion 2005   L5-S1   SVT (supraventricular tachycardia) (HCC)    Umbilical hernia     Allergies  Allergen Reactions   Lipitor  [Atorvastatin] Other (See Comments)    Muscle aches    Lodine [Etodolac] Other (See Comments)    STEVENS JOHNSONS    Immunization History  Administered Date(s) Administered   Fluad Quad(high Dose 65+) 03/21/2020, 03/22/2022   Influenza Split 03/14/2013   Influenza,inj,Quad PF,6+ Mos 02/27/2018, 03/22/2019   Influenza-Unspecified 02/26/2014, 03/27/2021, 04/25/2023   PFIZER(Purple Top)SARS-COV-2 Vaccination 07/17/2019, 08/07/2019, 04/16/2020   Pneumococcal Conjugate-13 05/20/2015   Pneumococcal Polysaccharide-23 12/24/2011, 08/12/2020   Tdap 09/08/2007, 01/14/2018   Zoster Recombinant(Shingrix) 02/09/2019, 04/18/2019   Past Surgical History:  Procedure Laterality Date   BREAST LUMPECTOMY Left 2008   malignant   INSERTION OF MESH N/A 03/21/2018   Procedure: INSERTION OF MESH;  Surgeon: Belinda Cough, MD;  Location: [facility];  Service: General;  Laterality: N/A;  LMA   LUMBAR FUSION  2005   OVARY REMOVED  1997   UMBILICAL HERNIA  REPAIR N/A 03/21/2018   Procedure: HERNIA REPAIR UMBILICAL ADULT WITH POSSIBLE MESH;  Surgeon: Belinda Cough, MD;  Location: [facility];  Service: General;  Laterality: N/A;  LMA    Family History  Problem  Relation Age of Onset   Hypertension Mother    Diabetes Father    Hypertension Father        pulmonary HTN   Cancer Father    Kidney disease Father    Diabetes Sister    Hypertension Brother    Breast cancer Neg Hx    Social History   Social History Narrative   Widowed since October 2023. 2 adult sons. Has grandchildren. 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       Family history: Father with history of diabetes and hypertension died from complications of heart failure.  Mother still living.  Brother with history of hypertension.  Sister in good health.   Review of Systems  Constitutional:  Negative for fever and malaise/fatigue.  HENT:  Negative for congestion.   Eyes:  Negative for blurred vision.  Respiratory:  Negative for cough and shortness of breath.   Cardiovascular:  Negative for chest pain, palpitations and leg swelling.  Gastrointestinal:  Negative for vomiting.  Musculoskeletal:  Negative for back pain.  Skin:  Negative for rash.  Neurological:  Negative for loss of consciousness and headaches.     Objective:  Vitals: BP 128/80   Pulse 78   Ht 5' 0.75 (1.543 m)   Wt 160 lb (72.6 kg)   SpO2 98%   BMI 30.48 kg/m   Physical Exam Vitals and nursing note reviewed.  Constitutional:      General: She is not in acute distress.    Appearance: Normal appearance. She is not toxic-appearing.  HENT:     Head: Normocephalic and atraumatic.  Pulmonary:     Effort: Pulmonary effort is normal.  Skin:    General: Skin is warm and dry.  Neurological:     Mental Status: She is alert and oriented to person, place, and time. Mental status is at baseline.  Psychiatric:        Mood and Affect: Mood normal.        Behavior: Behavior normal.        Thought Content: Thought content normal.        Judgment: Judgment normal.     Results:  Studies Obtained And Personally Reviewed By  Me: Labs:  CBC w/ Differential Lab Results  Component Value Date   WBC 7.0 02/14/2024   RBC 4.09 02/14/2024   HGB 12.1 02/14/2024   HCT 38.7 02/14/2024   PLT 386 02/14/2024   MCV 94.6 02/14/2024   MCH 29.6 02/14/2024   MCHC 31.3 (L) 02/14/2024   RDW 14.0 02/14/2024   MPV 9.7 02/14/2024   LYMPHSABS 1.0 09/08/2023   MONOABS 0.4 09/08/2023   BASOSABS 0.1 09/08/2023    Comprehensive Metabolic Panel Lab Results  Component Value Date   NA 140 09/08/2023   K 4.1 09/08/2023   CL 105 09/08/2023   CO2 23 09/08/2023   GLUCOSE 114 (H) 09/08/2023   BUN 28 (H) 09/08/2023   CREATININE 1.31 (H) 09/08/2023   CALCIUM  9.7 09/08/2023   PROT 6.6 02/09/2024   ALBUMIN 4.0 09/08/2023   AST 19 02/09/2024   ALT 16 02/09/2024   ALKPHOS 43 09/08/2023   BILITOT 0.5 02/09/2024  EGFR 45 (L) 08/11/2023   GFRNONAA 44 (L) 09/08/2023   Lipid Panel  Lab Results  Component Value Date   CHOL 158 02/09/2024   HDL 75 02/09/2024   LDLCALC 65 02/09/2024   TRIG 98 02/09/2024   A1c Lab Results  Component Value Date   HGBA1C 5.9 (H) 02/09/2024    TSH Lab Results  Component Value Date   TSH 1.77 08/11/2023   Hepatic Function Panel    Latest Ref Rng & Units 02/09/2024    9:19 AM 09/08/2023   10:20 AM 08/11/2023    9:45 AM  Hepatic Function  Total Protein 6.1 - 8.1 g/dL 6.6  6.7  6.8   Albumin 3.5 - 5.0 g/dL  4.0    AST 10 - 35 U/L 19  26  24    ALT 6 - 29 U/L 16  13  17    Alk Phosphatase 38 - 126 U/L  43    Total Bilirubin 0.2 - 1.2 mg/dL 0.5  0.4  0.5   Bilirubin, Direct 0.0 - 0.2 mg/dL 0.2      Assessment & Plan:   Orders Placed This Encounter  Procedures   Basic metabolic panel with GFR   CBC   Ambulatory referral to Endocrinology    Referral Priority:   Routine    Referral Type:   Consultation    Referral Reason:   Specialty Services Required    Number of Visits Requested:   1   Meds ordered this encounter  Medications   JANUVIA  100 MG tablet    Sig: Take 1 tablet (100 mg  total) by mouth daily.    Dispense:  90 tablet    Refill:  1   metFORMIN  (GLUCOPHAGE ) 500 MG tablet    Sig: Take 1 tablet (500 mg total) by mouth 2 (two) times daily.    Dispense:  180 tablet    Refill:  3   valACYclovir  (VALTREX ) 500 MG tablet    Sig: Take 1 tablet (500 mg total) by mouth daily for 5 days    Dispense:  90 tablet    Refill:  3   losartan  (COZAAR ) 100 MG tablet    Sig: Take 1 tablet (100 mg total) by mouth daily.    Dispense:  90 tablet    Refill:  3   Hypertension treated with Amlodipine  5 mg daily and Losartan  100 mg daily; Blood Pressure: normotensive today at 128/80.   Refilling Losartan  today.  Hyperlipidemia treated with Fenofibrate  160 mg daily and Rosuvastatin  10 mg daily. 02/09/2024 Lipid Panel: WNL.  Diabetes Mellitus, type II treated with Januvia  100 mg daily, Metformin  500 mg twice daily.  02/09/2024 HgbA1c 5.9, decreased from 6.2 in February 2025, no change from 01/2023; and Albumin WNL at 0.6. Eye exam 04/22/2023 at [facility] did not detect diabetic retinopathy. Since February, when she weighed 158 lbs, she has gained 2 pounds and today weighs 160 lbsBMI 30.48.   Hypoglycemia: has Freestyle Libre CGM for monitoring glucose, and apparently her BGs recently have been lowering into the 60s at night.   Chronic Kidney Disease followed by [facility], who she last saw 04/25/2023.   No metabolic panel collected initially, ordering B-MET today, and due to associated anemia, ordering CBC per patient request.  Musculoskeletal Pain currently including right shoulder, which she is being seen at PT for and bilateral CMC joints in thumbs, which have been injected and she is wearing bilateral thumb splints for. Scheduled to see Dr. Vernetta, Orthopedist  tomorrow.  Vaccine Counseling: discussed Influenza, which she is agreeable to updating this fall.   I,Emily Lagle,acting as a Neurosurgeon for Ronal JINNY Hailstone, MD.,have documented all relevant  documentation on the behalf of Ronal JINNY Hailstone, MD,as directed by  Ronal JINNY Hailstone, MD while in the presence of Ronal JINNY Hailstone, MD.  I, Ronal JINNY Hailstone, MD, have reviewed all documentation for this visit. The documentation on 02/14/2024 for the exam, diagnosis, procedures, and orders are all accurate and complete.

## 2024-02-15 ENCOUNTER — Encounter: Payer: Self-pay | Admitting: Orthopaedic Surgery

## 2024-02-15 ENCOUNTER — Ambulatory Visit: Payer: Self-pay | Admitting: Internal Medicine

## 2024-02-15 ENCOUNTER — Ambulatory Visit (INDEPENDENT_AMBULATORY_CARE_PROVIDER_SITE_OTHER): Admitting: Orthopaedic Surgery

## 2024-02-15 DIAGNOSIS — M25511 Pain in right shoulder: Secondary | ICD-10-CM

## 2024-02-15 DIAGNOSIS — G8929 Other chronic pain: Secondary | ICD-10-CM | POA: Diagnosis not present

## 2024-02-15 LAB — BASIC METABOLIC PANEL WITH GFR
BUN/Creatinine Ratio: 21 (calc) (ref 6–22)
BUN: 29 mg/dL — ABNORMAL HIGH (ref 7–25)
CO2: 26 mmol/L (ref 20–32)
Calcium: 9.7 mg/dL (ref 8.6–10.4)
Chloride: 106 mmol/L (ref 98–110)
Creat: 1.37 mg/dL — ABNORMAL HIGH (ref 0.60–1.00)
Glucose, Bld: 86 mg/dL (ref 65–99)
Potassium: 4.8 mmol/L (ref 3.5–5.3)
Sodium: 141 mmol/L (ref 135–146)
eGFR: 42 mL/min/1.73m2 — ABNORMAL LOW (ref >=60)

## 2024-02-15 NOTE — Progress Notes (Signed)
 The patient is an active 68 year old female who comes in for continued follow-up as a relates to her right shoulder pain and impingement.  She has had at least 3 steroid injections in that right shoulder.  She has been going to physical therapy and making some progress with physical therapy.  She is leaving and receiving dry needling.  However even the therapist now at this point are questioning what may be going on the shoulder to better define what treatment realities may be best for her.  On my exam she does still show some signs of impingement and some slight weakness with the rotator cuff on external rotation of the right shoulder.  At this point a MRI is warranted of her right shoulder to rule out rotator cuff tear given that she has had 3 steroid injections, time and physical therapy for the right shoulder.  She cannot take anti-inflammatories due to chronic renal insufficiency.  We will see her back in follow-up once we have the MRI of the right shoulder.  She agrees with the treatment plan.

## 2024-02-15 NOTE — Patient Instructions (Addendum)
 Patient is being referred back to [facility]. Not seen there in a few years. Otherwise, she will continue current meds and follow up with Medicare wellness and health maintenance in 6 months. Currently not on NSAIDS due to CKD. Taking Tramadol  sparingly for musculoskeletal pain. Renal ultrasound ordered.Also referring to Endocrinology for consideration of GLP-1 medication.

## 2024-02-16 ENCOUNTER — Other Ambulatory Visit: Payer: Self-pay

## 2024-02-16 DIAGNOSIS — G8929 Other chronic pain: Secondary | ICD-10-CM

## 2024-02-20 ENCOUNTER — Other Ambulatory Visit: Payer: Self-pay

## 2024-02-20 ENCOUNTER — Other Ambulatory Visit (HOSPITAL_COMMUNITY): Payer: Self-pay

## 2024-02-29 ENCOUNTER — Ambulatory Visit
Admission: RE | Admit: 2024-02-29 | Discharge: 2024-02-29 | Disposition: A | Discharge status: Home / Self Care | Source: Ambulatory Visit | Attending: Orthopaedic Surgery | Admitting: Orthopaedic Surgery

## 2024-02-29 DIAGNOSIS — G8929 Other chronic pain: Secondary | ICD-10-CM

## 2024-03-02 ENCOUNTER — Other Ambulatory Visit: Payer: Self-pay

## 2024-03-09 ENCOUNTER — Other Ambulatory Visit (HOSPITAL_COMMUNITY): Payer: Self-pay

## 2024-03-19 ENCOUNTER — Ambulatory Visit: Admitting: Orthopedic Surgery

## 2024-03-19 DIAGNOSIS — M180 Bilateral primary osteoarthritis of first carpometacarpal joints: Secondary | ICD-10-CM

## 2024-03-19 NOTE — Progress Notes (Signed)
 Jill Richardson - 68 y.o. female MRN [id number]  Date of birth: 11-16-1955  Office Visit Note: Visit Date: 03/19/2024 PCP: Perri Ronal PARAS, MD Referred by: Perri Ronal PARAS, MD  Subjective: No chief complaint on file.  HPI: Jill Richardson is a pleasant 68 y.o. female who presents today for follow-up of bilateral thumb basilar joint pain that is been present for multiple years, worsening in nature.  Also has notable hyperextension to the IP joints of the thumbs bilaterally.  Has trialed bracing extensively without lasting relief.    Underwent injections to the bilateral thumb CMC joints approximately 6 weeks prior with notable improvement in her symptoms.  Has been utilizing the Comfort Cool brace as instructed as well.  Pertinent ROS were reviewed with the patient and found to be negative unless otherwise specified above in HPI.    Assessment & Plan: Visit Diagnoses:  1. Arthritis of carpometacarpal (CMC) joint of both thumbs      Plan: Extensive discussion was had with the patient once again regarding her bilateral thumb basilar joint pain.  X-rays today confirm diagnosis of ongoing bilateral thumb CMC arthritis which correlates with her clinical examination.  We also discussed the significant MCP hyperextension indicating ongoing chronicity of this condition.  We reviewed the etiology and pathophysiology of this condition as well as appropriate treatment modalities ranging from conservative to surgical.  From a conservative standpoint, we discussed bracing, activity modification, nonsteroidal anti-inflammatory medications both oral and topical, and finally cortisone injections.  From surgical standpoint, we discussed the possibility for thumb CMC arthroplasty and MP capsulodesis versus fusion in the future should symptoms refractory to conservative care.  I discussed the surgical treatment as well as the postop protocol in detail with her as well today for her understanding.  She will hold  off for the time being, would like to continue with conservative measures.  I recommended that she return as needed moving forward for potential consideration of repeat injections in the future or further discussion of potential surgical intervention.  She expressed full understanding.   Follow-up: No follow-ups on file.   Meds & Orders: No orders of the defined types were placed in this encounter.   No orders of the defined types were placed in this encounter.    Procedures: No procedures performed      Clinical History: No specialty comments available.  She reports that she has never smoked. She has never used smokeless tobacco.  Recent Labs    08/11/23 0945 02/09/24 0919  HGBA1C 6.2* 5.9*    Objective:   Vital Signs: There were no vitals taken for this visit.  Physical Exam  Gen: Well-appearing, in no acute distress; non-toxic CV: Regular Rate. Well-perfused. Warm.  Resp: Breathing unlabored on room air; no wheezing. Psych: Fluid speech in conversation; appropriate affect; normal thought process  Ortho Exam General: Patient is well appearing and in no distress.    Skin and Muscle: No skin changes are apparent to upper extremities.  Muscle bulk and contour normal, no signs of atrophy.      Range of Motion and Palpation Tests: Mobility is full about the elbows with flexion and extension.  Forearm supination and pronation are 85/85 bilaterally.  Wrist flexion/extension is 75/65 bilaterally.  Digital flexion and extension are full.  Thumb opposition is full to the base of the small fingers bilaterally.     No cords or nodules are palpated.  No triggering is observed.     Significant tenderness  over the bilateral thumb CMC articulation is observed, positive grind for pain, positive crepitus.  MP hyperextension positive bilaterally, approximately 20 degrees.      Neurologic, Vascular, Motor: Sensation is intact to light touch in the median/radial/ulnar distributions.   Tinel's testing negative at wrist level.  Fingers pink and well perfused.  Capillary refill is brisk.     Imaging: No results found.   Past Medical/Family/Surgical/Social History: Medications & Allergies reviewed per EMR, new medications updated. Patient Active Problem List   Diagnosis Date Noted   Spondylolisthesis at L4-L5 level 03/16/2023   Anemia in chronic kidney disease 09/08/2022   Essential hypertension 01/26/2019   Menopausal syndrome 10/01/2015   Depression 08/22/2012   Insomnia 08/22/2012   Metabolic syndrome 04/25/2012   Type 2 diabetes mellitus (HCC) 11/28/2011   Hyperlipidemia 12/23/2010   GE reflux 12/23/2010   Fibromyalgia 12/23/2010   Breast cancer of upper-outer quadrant of left female breast (HCC) 12/23/2010   History of supraventricular tachycardia 12/23/2010   Past Medical History:  Diagnosis Date   Anemia    Breast cancer (HCC) 2008   Chronic kidney disease    Complication of anesthesia    Diabetes mellitus without complication (HCC)    Dysrhythmia    hx SVT   Fibromyalgia    GERD (gastroesophageal reflux disease)    Hyperlipidemia    Hypertension    Personal history of radiation therapy    left breast Ca   PONV (postoperative nausea and vomiting)    S/P lumbar fusion 2005   L5-S1   SVT (supraventricular tachycardia)    Umbilical hernia    Family History  Problem Relation Age of Onset   Hypertension Mother    Diabetes Father    Hypertension Father        pulmonary HTN   Cancer Father    Kidney disease Father    Diabetes Sister    Hypertension Brother    Breast cancer Neg Hx    Past Surgical History:  Procedure Laterality Date   BREAST LUMPECTOMY Left 2008   malignant   INSERTION OF MESH N/A 03/21/2018   Procedure: INSERTION OF MESH;  Surgeon: Belinda Cough, MD;  Location: [facility];  Service: General;  Laterality: N/A;  LMA   LUMBAR FUSION  2005   OVARY REMOVED  1997   UMBILICAL HERNIA REPAIR N/A 03/21/2018    Procedure: HERNIA REPAIR UMBILICAL ADULT WITH POSSIBLE MESH;  Surgeon: Belinda Cough, MD;  Location: [facility];  Service: General;  Laterality: N/A;  LMA   Social History   Occupational History   Not on file  Tobacco Use   Smoking status: Never   Smokeless tobacco: Never  Substance and Sexual Activity   Alcohol use: No    Alcohol/week: 0.0 standard drinks of alcohol   Drug use: No   Sexual activity: Not on file    Marinda Tyer Afton Alderton, M.D. [facility], Hand Surgery

## 2024-03-26 ENCOUNTER — Ambulatory Visit: Admitting: Orthopaedic Surgery

## 2024-03-26 ENCOUNTER — Encounter: Payer: Self-pay | Admitting: Orthopaedic Surgery

## 2024-03-26 DIAGNOSIS — M7541 Impingement syndrome of right shoulder: Secondary | ICD-10-CM | POA: Diagnosis not present

## 2024-03-26 DIAGNOSIS — M25511 Pain in right shoulder: Secondary | ICD-10-CM

## 2024-03-26 DIAGNOSIS — G8929 Other chronic pain: Secondary | ICD-10-CM

## 2024-03-26 NOTE — Progress Notes (Signed)
 The patient is an active 68 year old female who comes in today to go over MRI of her right shoulder.  She has been dealing with right shoulder pain with over activities and reaching behind her for some time now.  She has had at least 3 steroid injection of the right shoulder and actually the last injection in her right shoulder has actually been a long period of time ago.  On exam her right shoulder shows no weakness but definitely signs of impingement.  MRI shows severe tendinosis of the supraspinatus tendon but no tears.  There is a lipoma near his the teres minor and I can actually palpate this but it is benign.  We talked about the potential for surgery at some point in the right shoulder with an scopic intervention.  However with physical therapy her shoulders felt much better.  I think the next to have her be considering a steroid injection again at some point when it started bothering her enough.  Surprisingly it is actually been over a year since she has had a steroid injection in her right shoulder subacromial outlet.  We are happy to do this at any time if your shoulder starts bothering her again.

## 2024-04-03 LAB — HM DIABETES EYE EXAM

## 2024-04-04 ENCOUNTER — Other Ambulatory Visit (HOSPITAL_COMMUNITY): Payer: Self-pay

## 2024-04-09 ENCOUNTER — Other Ambulatory Visit (HOSPITAL_BASED_OUTPATIENT_CLINIC_OR_DEPARTMENT_OTHER): Payer: Self-pay

## 2024-04-09 MED ORDER — FLUZONE HIGH-DOSE 0.5 ML IM SUSY
0.5000 mL | PREFILLED_SYRINGE | Freq: Once | INTRAMUSCULAR | 0 refills | Status: AC
  Filled 2024-04-09: qty 0.5, 1d supply, fill #0

## 2024-04-10 ENCOUNTER — Encounter: Payer: Self-pay | Admitting: Internal Medicine

## 2024-04-24 ENCOUNTER — Other Ambulatory Visit: Payer: Self-pay

## 2024-04-30 ENCOUNTER — Encounter: Payer: Self-pay | Admitting: Radiology

## 2024-05-01 ENCOUNTER — Other Ambulatory Visit (HOSPITAL_COMMUNITY): Payer: Self-pay

## 2024-05-01 ENCOUNTER — Other Ambulatory Visit: Payer: Self-pay

## 2024-05-30 ENCOUNTER — Other Ambulatory Visit: Payer: Self-pay

## 2024-06-01 ENCOUNTER — Other Ambulatory Visit: Payer: Self-pay

## 2024-06-06 ENCOUNTER — Ambulatory Visit: Admitting: Endocrinology

## 2024-06-07 ENCOUNTER — Other Ambulatory Visit (HOSPITAL_COMMUNITY): Payer: Self-pay

## 2024-06-10 NOTE — Progress Notes (Unsigned)
 Jill Richardson - 68 y.o. female MRN [id number]  Date of birth: 1955-10-25  Office Visit Note: Visit Date: 06/11/2024 PCP: Perri Ronal PARAS, MD Referred by: Perri Ronal PARAS, MD  Subjective: No chief complaint on file.  HPI: Jill Richardson is a pleasant 68 y.o. female who presents today for follow-up of bilateral thumb basilar joint pain that is been present for multiple years, worsening in nature.  Also has notable hyperextension to the IP joints of the thumbs bilaterally.  Has trialed bracing extensively without lasting relief.    Underwent injections to the bilateral thumb CMC joints earlier this year with transient improvement in her symptoms.  Has been utilizing the Comfort Cool brace as instructed as well.  At this juncture, given her ongoing pain refractory to conservative care, she is interested in further discussion regarding potential thumb CMC arthritis and addressing the MCP hyperextension.  She would like to begin with the left side.  She is also describing ongoing left thumb clicking and locking, consistent with trigger finger.  Pertinent ROS were reviewed with the patient and found to be negative unless otherwise specified above in HPI.    Assessment & Plan: Visit Diagnoses:  1. Arthritis of carpometacarpal (CMC) joint of both thumbs   2. Trigger finger of left thumb       Plan: Extensive discussion was had with the patient today regarding her ongoing left thumb CMC osteoarthritis with associated MCP hyperextension and left thumb trigger digit.  X-rays previously obtained were reviewed in detail today which do show significant degenerative change at the left thumb [facility] articulation.  We discussed treatment modalities ranging from conservative to surgical.  From a conservative standpoint we discussed bracing, injections, anti-inflammatory medications and activity modification.  From a surgical standpoint we discussed [facility] arthroplasty, risks and benefits as well as the  postoperative protocol.  We also discussed that the MCP hyperextension would need to be addressed intraoperatively.  She is having ongoing triggering of the left thumb as well which could be addressed surgically.  At this juncture, given the severity of symptoms that have remained refractory to conservative care, patient would like to move forward with surgical intervention.  Given the significance of the degenerative change on x-ray which clinically correlates with examination, we can move forward with surgical scheduling of left thumb CMC arthroplasty with associated MCP capsulodesis and pinning and left thumb trigger digit release at the next available date.  We did discuss that consideration will be given to the MCP fusion of the left thumb as well at the time of surgery should the hyperextension continue to worsen.  Risks and benefits of the procedure were discussed, risks including but not limited to infection, bleeding, scarring, stiffness, nerve injury, tendon injury, vascular injury, hardware complication, recurrence of symptoms and need for subsequent operation.  We also discussed the specifics of the postoperative protocol and the appropriate timeline.  Patient expressed understanding.    Follow-up: No follow-ups on file.   Meds & Orders: No orders of the defined types were placed in this encounter.   No orders of the defined types were placed in this encounter.    Procedures: No procedures performed      Clinical History: No specialty comments available.  She reports that she has never smoked. She has never used smokeless tobacco.  Recent Labs    08/11/23 0945 02/09/24 0919  HGBA1C 6.2* 5.9*    Objective:   Vital Signs: There were no vitals taken for this  visit.  Physical Exam  Gen: Well-appearing, in no acute distress; non-toxic CV: Regular Rate. Well-perfused. Warm.  Resp: Breathing unlabored on room air; no wheezing. Psych: Fluid speech in conversation; appropriate  affect; normal thought process  Ortho Exam General: Patient is well appearing and in no distress.    Skin and Muscle: No skin changes are apparent to upper extremities.  Muscle bulk and contour normal, no signs of atrophy.      Range of Motion and Palpation Tests: Mobility is full about the elbows with flexion and extension.  Forearm supination and pronation are 85/85 bilaterally.  Wrist flexion/extension is 75/65 bilaterally.  Digital flexion and extension are full.  Thumb opposition is full to the base of the small fingers bilaterally.     No cords or nodules are palpated.  No triggering is observed.     Significant tenderness over the bilateral thumb CMC articulation is observed, positive grind for pain, positive crepitus.  MP hyperextension positive bilaterally, approximately 25 degrees.      Neurologic, Vascular, Motor: Sensation is intact to light touch in the median/radial/ulnar distributions.  Tinel's testing negative at wrist level.  Fingers pink and well perfused.  Capillary refill is brisk.     Imaging: No results found.   Past Medical/Family/Surgical/Social History: Medications & Allergies reviewed per EMR, new medications updated. Patient Active Problem List   Diagnosis Date Noted   Spondylolisthesis at L4-L5 level 03/16/2023   Anemia in chronic kidney disease 09/08/2022   Essential hypertension 01/26/2019   Menopausal syndrome 10/01/2015   Depression 08/22/2012   Insomnia 08/22/2012   Metabolic syndrome 04/25/2012   Type 2 diabetes mellitus (HCC) 11/28/2011   Hyperlipidemia 12/23/2010   GE reflux 12/23/2010   Fibromyalgia 12/23/2010   Breast cancer of upper-outer quadrant of left female breast (HCC) 12/23/2010   History of supraventricular tachycardia 12/23/2010   Past Medical History:  Diagnosis Date   Anemia    Breast cancer (HCC) 2008   Chronic kidney disease    Complication of anesthesia    Diabetes mellitus without complication (HCC)     Dysrhythmia    hx SVT   Fibromyalgia    GERD (gastroesophageal reflux disease)    Hyperlipidemia    Hypertension    Personal history of radiation therapy    left breast Ca   PONV (postoperative nausea and vomiting)    S/P lumbar fusion 2005   L5-S1   SVT (supraventricular tachycardia)    Umbilical hernia    Family History  Problem Relation Age of Onset   Hypertension Mother    Diabetes Father    Hypertension Father        pulmonary HTN   Cancer Father    Kidney disease Father    Diabetes Sister    Hypertension Brother    Breast cancer Neg Hx    Past Surgical History:  Procedure Laterality Date   BREAST LUMPECTOMY Left 2008   malignant   INSERTION OF MESH N/A 03/21/2018   Procedure: INSERTION OF MESH;  Surgeon: Belinda Cough, MD;  Location: [facility];  Service: General;  Laterality: N/A;  LMA   LUMBAR FUSION  2005   OVARY REMOVED  1997   UMBILICAL HERNIA REPAIR N/A 03/21/2018   Procedure: HERNIA REPAIR UMBILICAL ADULT WITH POSSIBLE MESH;  Surgeon: Belinda Cough, MD;  Location:  [facility];  Service: General;  Laterality: N/A;  LMA   Social History   Occupational History   Not on file  Tobacco Use  Smoking status: Never   Smokeless tobacco: Never  Substance and Sexual Activity   Alcohol use: No    Alcohol/week: 0.0 standard drinks of alcohol   Drug use: No   Sexual activity: Not on file    Aime Meloche Estela) Arlinda, M.D. [facility], Hand Surgery

## 2024-06-11 ENCOUNTER — Ambulatory Visit: Admitting: Orthopedic Surgery

## 2024-06-11 DIAGNOSIS — M65312 Trigger thumb, left thumb: Secondary | ICD-10-CM

## 2024-06-11 DIAGNOSIS — M180 Bilateral primary osteoarthritis of first carpometacarpal joints: Secondary | ICD-10-CM

## 2024-06-12 ENCOUNTER — Encounter: Payer: Self-pay | Admitting: Endocrinology

## 2024-06-12 ENCOUNTER — Ambulatory Visit: Payer: Self-pay | Admitting: Endocrinology

## 2024-06-12 ENCOUNTER — Other Ambulatory Visit: Payer: Self-pay

## 2024-06-12 ENCOUNTER — Ambulatory Visit: Admitting: Endocrinology

## 2024-06-12 VITALS — BP 150/90 | Ht 60.75 in | Wt 163.0 lb

## 2024-06-12 DIAGNOSIS — E118 Type 2 diabetes mellitus with unspecified complications: Secondary | ICD-10-CM

## 2024-06-12 DIAGNOSIS — Z7985 Long-term (current) use of injectable non-insulin antidiabetic drugs: Secondary | ICD-10-CM | POA: Diagnosis not present

## 2024-06-12 LAB — POCT GLYCOSYLATED HEMOGLOBIN (HGB A1C): Hemoglobin A1C: 5.7 % — AB (ref 4.0–5.6)

## 2024-06-12 MED ORDER — TIRZEPATIDE 5 MG/0.5ML ~~LOC~~ SOAJ
5.0000 mg | SUBCUTANEOUS | 4 refills | Status: AC
  Filled 2024-06-12: qty 6, 84d supply, fill #0

## 2024-06-12 MED ORDER — TIRZEPATIDE 2.5 MG/0.5ML ~~LOC~~ SOAJ
SUBCUTANEOUS | 4 refills | Status: AC
  Filled 2024-06-12 – 2024-07-10 (×3): qty 2, 28d supply, fill #0

## 2024-06-12 NOTE — Progress Notes (Unsigned)
 Outpatient Endocrinology Note Kadarius Cuffe, MD   Patient's Name: Jill Richardson    DOB: [birth year]    MRN: [id number]                                                    REASON OF VISIT: New consult for type 2 diabetes mellitus  REFERRING PROVIDER: Perri Ronal PARAS, MD   PCP: Perri Ronal PARAS, MD  HISTORY OF PRESENT ILLNESS:   Jill Richardson is a 68 y.o. old female with past medical history listed below, is here for new consult  for type 2 diabetes mellitus.   Pertinent Diabetes History: Patient is referred to endocrinology for further evaluation and management of type 2 diabetes mellitus, initial consult on June 12, 2024.  Patient was diagnosed with type 2 diabetes mellitus in May of 2013, hemoglobin A1c at the time of diagnosis was 14.8%.  Patient has hemoglobin A1c in the range of 5 to 6% range consistent with controlled type 2 diabetes mellitus.  With the concern of hypoglycemia and on antidiabetic medication with type 2 diabetes mellitus referred to Endocrinology for evaluation and management.  History of DKA or diabetes related hospitalizations: none  Previous diabetes education: Yes   Family h/o diabetes mellitus: Family history of type 2 diabetes mellitus.  No personal history of pancreatitis and / or family history of medullary thyroid  carcinoma or MEN 2B syndrome.   Chronic Diabetes Complications : Retinopathy: no. Last ophthalmology exam was done on annually 03/2024, following with ophthalmology regularly.  Nephropathy: CKD III B, on ACE/ARB / Losartan , following with nephrology. Peripheral neuropathy: no, on ?  Gabapentin . Coronary artery disease: no Stroke: no  Relevant comorbidities and cardiovascular risk factors: Obesity: yes Body mass index is 31.05 kg/m.  Hypertension: Yes  Hyperlipidemia : Yes, on statin / Rosuvastatin  and fenofibrate   Current / Home Diabetic regimen includes:  Januvia  100 mg 1/2 tab daily. Metformin  500 mg 1/2 tab two times a day.    Prior diabetic medications:  Glycemic data:    Patient has been using freestyle libre 3+ CGM, did not bring the reader in the clinic today, no glucose data to review.  Hypoglycemia: Patient has minor hypoglycemic episodes. Patient has hypoglycemia awareness.  Factors modifying glucose control: 1.  Diabetic diet assessment: 2 meals a day.  2.  Staying active or exercising: Physically active, walking.  3.  Medication compliance: compliant all of the time.  Interval history  Initial visit today.  Patient reports occasional hypoglycemia with blood sugar in 60s especially overnight, and in the afternoon after meals.  She retired in April of this year and lately she has been eating less compared to when she was working usually 2 meals a day.  She rarely noticed hypoglycemia in the early morning.  Some of the time she gets symptoms of diaphoresis, weak and lightheaded with hypoglycemia.  She had noticed hypoglycemia on her CGM freestyle libre and she confirm with taking fingerstick blood sugar with glucometer.  She did not bring freestyle libre reader to download and review glucose data in the clinic today.  Hemoglobin A1c 5.7%.  Couple of months ago she cut back on her diabetic medication from Januvia  100 mg 1 tablet to half tablet daily and metformin  500 mg 1 tablet 2 times a day to half tablet 2 times a day.  She is also concerned about slow and gradual weight gain.  She is a retired engineer, civil (consulting).  REVIEW OF SYSTEMS As per history of present illness.   PAST MEDICAL HISTORY: Past Medical History:  Diagnosis Date   Anemia    Breast cancer (HCC) 2008   Chronic kidney disease    Complication of anesthesia    Diabetes mellitus without complication (HCC)    Dysrhythmia    hx SVT   Fibromyalgia    GERD (gastroesophageal reflux disease)    Hyperlipidemia    Hypertension    Personal history of radiation therapy    left breast Ca   PONV (postoperative nausea and vomiting)    S/P  lumbar fusion 2005   L5-S1   SVT (supraventricular tachycardia)    Umbilical hernia     PAST SURGICAL HISTORY: Past Surgical History:  Procedure Laterality Date   BREAST LUMPECTOMY Left 2008   malignant   INSERTION OF MESH N/A 03/21/2018   Procedure: INSERTION OF MESH;  Surgeon: Belinda Cough, MD;  Location: [facility];  Service: General;  Laterality: N/A;  LMA   LUMBAR FUSION  2005   OVARY REMOVED  1997   UMBILICAL HERNIA REPAIR N/A 03/21/2018   Procedure: HERNIA REPAIR UMBILICAL ADULT WITH POSSIBLE MESH;  Surgeon: Belinda Cough, MD;  Location: [facility];  Service: General;  Laterality: N/A;  LMA    ALLERGIES: Allergies[1]  FAMILY HISTORY:  Family History  Problem Relation Age of Onset   Hypertension Mother    Diabetes Father    Hypertension Father        pulmonary HTN   Cancer Father    Kidney disease Father    Diabetes Sister    Hypertension Brother    Breast cancer Neg Hx     SOCIAL HISTORY: Social History   Socioeconomic History   Marital status: Widowed    Spouse name: Not on file   Number of children: Not on file   Years of education: Not on file   Highest education level: Master's degree (e.g., MA, MS, MEng, MEd, MSW, MBA)  Occupational History   Not on file  Tobacco Use   Smoking status: Never   Smokeless tobacco: Never  Substance and Sexual Activity   Alcohol use: No    Alcohol/week: 0.0 standard drinks of alcohol   Drug use: No   Sexual activity: Not on file  Other Topics Concern   Not on file  Social History Narrative   Widowed since October 2023. 2 adult sons. Has grandchildren. 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       Family history: Father with history of diabetes and hypertension died from complications of heart failure.  Mother still living.  Brother with history of hypertension.  Sister in good health.   Social  Drivers of Health   Tobacco Use: Low Risk (06/12/2024)   Patient History    Smoking Tobacco Use: Never    Smokeless Tobacco Use: Never    Passive Exposure: Not on file  Financial Resource Strain: Low Risk (02/10/2024)   Overall Financial Resource Strain (CARDIA)    Difficulty of Paying Living Expenses: Not hard at all  Food Insecurity: No Food Insecurity (02/10/2024)   Epic    Worried About Radiation Protection Practitioner of Food in the Last Year: Never true    Ran Out of Food in the Last Year: Never true  Transportation Needs: No Transportation Needs (02/10/2024)   Epic    Lack of Transportation (Medical): No    Lack of Transportation (Non-Medical): No  Physical Activity: Insufficiently Active (02/10/2024)   Exercise Vital Sign    Days of Exercise per Week: 3 days    Minutes of Exercise per Session: 20 min  Stress: No Stress Concern Present (02/10/2024)   [employer] of Occupational Health - Occupational Stress Questionnaire    Feeling of Stress: Only a little  Social Connections: Moderately Integrated (02/10/2024)   Social Connection and Isolation Panel    Frequency of Communication with Friends and Family: More than three times a week    Frequency of Social Gatherings with Friends and Family: Once a week    Attends Religious Services: More than 4 times per year    Active Member of [employer] or Organizations: Yes    Attends Banker Meetings: 1 to 4 times per year    Marital Status: Widowed  Depression (PHQ2-9): Low Risk (02/14/2024)   Depression (PHQ2-9)    PHQ-2 Score: 2  Alcohol Screen: Low Risk (02/10/2024)   Alcohol Screen    Last Alcohol Screening Score (AUDIT): 1  Housing: Low Risk (02/10/2024)   Epic    Unable to Pay for Housing in the Last Year: No    Number of Times Moved in the Last Year: 0    Homeless in the Last Year: No  Utilities: Not At Risk (08/16/2023)   AHC Utilities    Threatened with loss of utilities: No  Health Literacy: Adequate Health Literacy (08/16/2023)    B1300 Health Literacy    Frequency of need for help with medical instructions: Never    MEDICATIONS:  Current Outpatient Medications  Medication Sig Dispense Refill   amLODipine  (NORVASC ) 5 MG tablet Take 1 tablet (5 mg total) by mouth daily. 90 tablet 3   blood glucose meter kit and supplies Use to check blood sugar up to 4 times daily as directed 1 each 0   calcium -vitamin D  (OSCAL WITH D) 500-200 MG-UNIT per tablet Take 1 tablet by mouth 2 (two) times daily.     Continuous Glucose Receiver (FREESTYLE LIBRE 3 READER) DEVI Use as directed to continuously monitor blood glucose 1 each 0   Continuous Glucose Sensor (FREESTYLE LIBRE 3 PLUS SENSOR) MISC Use to check blood sugar continuously. Change sensor every 15 days. Dx E11.9 2 each 11   cyclobenzaprine  (FLEXERIL ) 10 MG tablet Take 1 tablet (10 mg total) by mouth 3 (three) times daily as needed for muscle spasms. 90 tablet 1   fenofibrate  160 MG tablet Take 1 tablet (160 mg total) by mouth daily. 90 tablet 3   ferrous sulfate  325 (65 FE) MG tablet Take 325 mg by mouth 2 (two) times daily.     gabapentin  (NEURONTIN ) 300 MG capsule Take 1 capsule (300 mg total) by mouth at bedtime. 90 capsule 3   glucosamine-chondroitin 500-400 MG tablet Take 1 tablet by mouth daily.     HYDROcodone -acetaminophen  (NORCO/VICODIN) 5-325 MG tablet Take 1 tablet by mouth every 6 (six) hours as needed for pain 60 tablet 0   LORazepam  (ATIVAN ) 2 MG tablet Take 1 tablet (2 mg total) by mouth at bedtime. 90 tablet 1   losartan  (COZAAR ) 100 MG tablet Take 1 tablet (100 mg total) by mouth daily. 90 tablet 3   Multiple Vitamin (MULTIVITAMIN) capsule Take 1 capsule by mouth daily.     pantoprazole  (PROTONIX ) 40 MG tablet Take 1 tablet (40  mg total) by mouth daily. 90 tablet 3   rosuvastatin  (CRESTOR ) 10 MG tablet Take 1 tablet (10 mg total) by mouth daily. 90 tablet 3   tirzepatide  (MOUNJARO ) 2.5 MG/0.5ML Pen Inject 2.5 mg into the skin once a week for 4 weeks and then  increase to 5 mg weekly. 2 mL 4   tirzepatide  (MOUNJARO ) 5 MG/0.5ML Pen Inject 5 mg into the skin once a week. After completion of 4 weeks of 2.5mg  dose. 6 mL 4   traMADol  (ULTRAM ) 50 MG tablet Take 1 tablet (50 mg total) by mouth every 6 (six) hours as needed. 20 tablet 0   valACYclovir  (VALTREX ) 500 MG tablet Take 1 tablet (500 mg total) by mouth daily for 5 days 90 tablet 3   venlafaxine  XR (EFFEXOR -XR) 75 MG 24 hr capsule Take 1 capsule (75 mg total) by mouth daily with breakfast. 90 capsule 3   No current facility-administered medications for this visit.    PHYSICAL EXAM: Vitals:   06/12/24 1457  BP: (!) 150/90  Weight: 163 lb (73.9 kg)  Height: 5' 0.75 (1.543 m)   Body mass index is 31.05 kg/m.  Wt Readings from Last 3 Encounters:  06/12/24 163 lb (73.9 kg)  02/14/24 160 lb (72.6 kg)  09/08/23 159 lb 3.2 oz (72.2 kg)    General: Well developed, well nourished female in no apparent distress.  HEENT: AT/[place], no external lesions.  Eyes: Conjunctiva clear and no icterus. Neck: Neck supple  Lungs: Respirations not labored Neurologic: Alert, oriented, normal speech Extremities / Skin: Dry. No sores or rashes noted.  Psychiatric: Does not appear depressed or anxious  Diabetic Foot Exam - Simple   No data filed    LABS Reviewed Lab Results  Component Value Date   HGBA1C 5.7 (A) 06/12/2024   HGBA1C 5.9 (H) 02/09/2024   HGBA1C 6.2 (H) 08/11/2023   No results found for: FRUCTOSAMINE Lab Results  Component Value Date   CHOL 158 02/09/2024   HDL 75 02/09/2024   LDLCALC 65 02/09/2024   TRIG 98 02/09/2024   CHOLHDL 2.1 02/09/2024   Lab Results  Component Value Date   MICRALBCREAT 3 02/09/2024   MICRALBCREAT 5 08/11/2023   Lab Results  Component Value Date   CREATININE 1.37 (H) 02/14/2024   No results found for: GFR  ASSESSMENT / PLAN  1. Controlled diabetes mellitus type 2 with complications (HCC)     Diabetes Mellitus type 2, complicated by CKD. -  Diabetic status / severity: Controlled.  Lab Results  Component Value Date   HGBA1C 5.7 (A) 06/12/2024    - Hemoglobin A1c goal : <6.5%  Patient reported concern of hypoglycemia especially overnight and occasionally in the afternoon.  No glucose data to review.  Discussed about type 2 diabetes mellitus and potential diabetic chronic complications.  At this time not clear about hypoglycemic episodes, possibly related to antidiabetic medication and lately she has also been eating less.  She has controlled type 2 diabetes mellitus for several years on oral antidiabetic medications.  She has BMI of 31 and she is concerned about gradual weight gain.  Will start GLP-1 receptor agonist for the management of type 2 diabetes mellitus and also with the benefit of weight loss.  - Medications: See below.  I) stop Januvia  and metformin . II) start Mounjaro  2.5 mg weekly for 4 weeks and increase to 5 mg weekly. III) consider SGLT2 inhibitor in the future.  Patient has CKD.  Patient is asked to bring her freestyle  libre reader to download and review the glucose data iat her convenience.  Patient is asked to call our clinic in between the visits with any concern about glucose.  - Home glucose testing: Continue Freestyle libre 3+ and check as needed. - Discussed/ Gave Hypoglycemia treatment plan.  # Consult : not required at this time.   # Annual urine for microalbuminuria/ creatinine ratio, no microalbuminuria currently, continue ACE/ARB /losartan .  Patient has CKD 3B.  Patient has been following with nephrology as well. Last  Lab Results  Component Value Date   MICRALBCREAT 3 02/09/2024    # Foot check nightly.  # Annual dilated diabetic eye exams.   - Diet: Make healthy diabetic food choices - Life style / activity / exercise: Discussed.  2. Blood pressure  -  BP Readings from Last 1 Encounters:  06/12/24 (!) 150/90    - Control is not in target. Asked to monitor at home.  If  remains high discussed with primary care provider. - No change in current plans.  3. Lipid status / Hyperlipidemia - Last  Lab Results  Component Value Date   LDLCALC 65 02/09/2024   - Continue rosuvastatin  10 mg daily.  Jill Richardson was seen today for establish care and diabetes.  Diagnoses and all orders for this visit:  Controlled diabetes mellitus type 2 with complications (HCC) -     POCT glycosylated hemoglobin (Hb A1C) -     tirzepatide  (MOUNJARO ) 2.5 MG/0.5ML Pen; Inject 2.5 mg into the skin once a week for 4 weeks and then increase to 5 mg weekly. -     tirzepatide  (MOUNJARO ) 5 MG/0.5ML Pen; Inject 5 mg into the skin once a week. After completion of 4 weeks of 2.5mg  dose.   DISPOSITION Follow up in clinic in 3 months suggested.   All questions answered and patient verbalized understanding of the plan.  Teola Felipe, MD [facility] [address] [place], [address] [place], [place] [address] Phone # [phone]  At least part of this note was generated using voice recognition software. Inadvertent word errors may have occurred, which were not recognized during the proofreading process.     [1]  Allergies Allergen Reactions   Lipitor  [Atorvastatin] Other (See Comments)    Muscle aches    Lodine [Etodolac] Other (See Comments)    STEVENS JOHNSONS

## 2024-06-13 ENCOUNTER — Other Ambulatory Visit (HOSPITAL_COMMUNITY): Payer: Self-pay

## 2024-06-18 ENCOUNTER — Encounter (HOSPITAL_COMMUNITY): Payer: Self-pay

## 2024-06-18 ENCOUNTER — Other Ambulatory Visit (HOSPITAL_COMMUNITY): Payer: Self-pay

## 2024-06-26 ENCOUNTER — Other Ambulatory Visit: Payer: Self-pay

## 2024-06-26 DIAGNOSIS — M65312 Trigger thumb, left thumb: Secondary | ICD-10-CM

## 2024-06-26 DIAGNOSIS — M180 Bilateral primary osteoarthritis of first carpometacarpal joints: Secondary | ICD-10-CM

## 2024-06-29 ENCOUNTER — Other Ambulatory Visit (HOSPITAL_COMMUNITY): Payer: Self-pay

## 2024-07-01 ENCOUNTER — Other Ambulatory Visit (HOSPITAL_COMMUNITY): Payer: Self-pay

## 2024-07-01 ENCOUNTER — Telehealth (HOSPITAL_COMMUNITY): Payer: Self-pay

## 2024-07-02 ENCOUNTER — Other Ambulatory Visit (HOSPITAL_COMMUNITY): Payer: Self-pay

## 2024-07-02 ENCOUNTER — Telehealth: Payer: Self-pay

## 2024-07-02 NOTE — Telephone Encounter (Signed)
 Pharmacy Patient Advocate Encounter   Received notification from Pt Calls Messages that prior authorization for Mounjaro  2.5mg /0.39ml is required/requested.   Insurance verification completed.   The patient is insured through [facility] ADVANTAGE/RX ADVANCE.   Per test claim: PA required; PA submitted to above mentioned insurance via Latent Key/confirmation #/EOC [id number] Status is pending

## 2024-07-04 ENCOUNTER — Other Ambulatory Visit: Payer: Self-pay

## 2024-07-04 ENCOUNTER — Other Ambulatory Visit (HOSPITAL_COMMUNITY): Payer: Self-pay

## 2024-07-04 DIAGNOSIS — M65312 Trigger thumb, left thumb: Secondary | ICD-10-CM

## 2024-07-04 DIAGNOSIS — M180 Bilateral primary osteoarthritis of first carpometacarpal joints: Secondary | ICD-10-CM

## 2024-07-04 NOTE — Telephone Encounter (Signed)
 Pharmacy Patient Advocate Encounter  Received notification from [facility] ADVANTAGE/RX ADVANCE that Prior Authorization for Mounjaro  2.5MG /0.5ML auto-injectors  has been APPROVED from 07/02/24 to 07/02/25. Ran test claim, Copay is $0.00. This test claim was processed through [facility]- copay amounts may vary at other pharmacies due to pharmacy/plan contracts, or as the patient moves through the different stages of their insurance plan.   PA #/Case ID/Reference #: [id number]

## 2024-07-10 ENCOUNTER — Other Ambulatory Visit (HOSPITAL_COMMUNITY): Payer: Self-pay

## 2024-07-10 ENCOUNTER — Other Ambulatory Visit: Payer: Self-pay

## 2024-07-12 ENCOUNTER — Encounter (HOSPITAL_BASED_OUTPATIENT_CLINIC_OR_DEPARTMENT_OTHER): Payer: Self-pay | Admitting: Orthopedic Surgery

## 2024-07-12 ENCOUNTER — Other Ambulatory Visit: Payer: Self-pay

## 2024-07-16 ENCOUNTER — Encounter (HOSPITAL_BASED_OUTPATIENT_CLINIC_OR_DEPARTMENT_OTHER)
Admission: RE | Admit: 2024-07-16 | Discharge: 2024-07-16 | Disposition: A | Source: Ambulatory Visit | Attending: Orthopedic Surgery

## 2024-07-16 DIAGNOSIS — E119 Type 2 diabetes mellitus without complications: Secondary | ICD-10-CM | POA: Insufficient documentation

## 2024-07-16 DIAGNOSIS — Z01812 Encounter for preprocedural laboratory examination: Secondary | ICD-10-CM | POA: Diagnosis present

## 2024-07-16 DIAGNOSIS — Z01810 Encounter for preprocedural cardiovascular examination: Secondary | ICD-10-CM | POA: Diagnosis present

## 2024-07-16 DIAGNOSIS — Z01818 Encounter for other preprocedural examination: Secondary | ICD-10-CM | POA: Insufficient documentation

## 2024-07-16 LAB — BASIC METABOLIC PANEL WITH GFR
Anion gap: 9 (ref 5–15)
BUN: 21 mg/dL (ref 8–23)
CO2: 26 mmol/L (ref 22–32)
Calcium: 9 mg/dL (ref 8.9–10.3)
Chloride: 107 mmol/L (ref 98–111)
Creatinine, Ser: 1.11 mg/dL — ABNORMAL HIGH (ref 0.44–1.00)
GFR, Estimated: 53 mL/min — ABNORMAL LOW
Glucose, Bld: 114 mg/dL — ABNORMAL HIGH (ref 70–99)
Potassium: 4.6 mmol/L (ref 3.5–5.1)
Sodium: 142 mmol/L (ref 135–145)

## 2024-07-20 ENCOUNTER — Encounter (HOSPITAL_BASED_OUTPATIENT_CLINIC_OR_DEPARTMENT_OTHER): Payer: Self-pay | Admitting: Orthopedic Surgery

## 2024-07-20 ENCOUNTER — Other Ambulatory Visit (HOSPITAL_COMMUNITY): Payer: Self-pay

## 2024-07-20 ENCOUNTER — Ambulatory Visit (HOSPITAL_BASED_OUTPATIENT_CLINIC_OR_DEPARTMENT_OTHER)
Admission: RE | Admit: 2024-07-20 | Discharge: 2024-07-20 | Disposition: A | Discharge status: Home / Self Care | Attending: Orthopedic Surgery | Admitting: Orthopedic Surgery

## 2024-07-20 ENCOUNTER — Ambulatory Visit (HOSPITAL_BASED_OUTPATIENT_CLINIC_OR_DEPARTMENT_OTHER)

## 2024-07-20 ENCOUNTER — Ambulatory Visit (HOSPITAL_BASED_OUTPATIENT_CLINIC_OR_DEPARTMENT_OTHER): Payer: Self-pay | Admitting: Anesthesiology

## 2024-07-20 ENCOUNTER — Encounter (HOSPITAL_BASED_OUTPATIENT_CLINIC_OR_DEPARTMENT_OTHER)
Admission: RE | Disposition: A | Discharge status: Home / Self Care | Payer: Self-pay | Source: Home / Self Care | Attending: Orthopedic Surgery

## 2024-07-20 DIAGNOSIS — I129 Hypertensive chronic kidney disease with stage 1 through stage 4 chronic kidney disease, or unspecified chronic kidney disease: Secondary | ICD-10-CM | POA: Diagnosis not present

## 2024-07-20 DIAGNOSIS — Z7985 Long-term (current) use of injectable non-insulin antidiabetic drugs: Secondary | ICD-10-CM | POA: Diagnosis not present

## 2024-07-20 DIAGNOSIS — Z01818 Encounter for other preprocedural examination: Secondary | ICD-10-CM

## 2024-07-20 DIAGNOSIS — K219 Gastro-esophageal reflux disease without esophagitis: Secondary | ICD-10-CM | POA: Diagnosis not present

## 2024-07-20 DIAGNOSIS — Z79899 Other long term (current) drug therapy: Secondary | ICD-10-CM | POA: Insufficient documentation

## 2024-07-20 DIAGNOSIS — M1812 Unilateral primary osteoarthritis of first carpometacarpal joint, left hand: Secondary | ICD-10-CM | POA: Diagnosis not present

## 2024-07-20 DIAGNOSIS — D631 Anemia in chronic kidney disease: Secondary | ICD-10-CM | POA: Diagnosis not present

## 2024-07-20 DIAGNOSIS — E1122 Type 2 diabetes mellitus with diabetic chronic kidney disease: Secondary | ICD-10-CM | POA: Insufficient documentation

## 2024-07-20 DIAGNOSIS — N189 Chronic kidney disease, unspecified: Secondary | ICD-10-CM | POA: Diagnosis not present

## 2024-07-20 DIAGNOSIS — E119 Type 2 diabetes mellitus without complications: Secondary | ICD-10-CM | POA: Insufficient documentation

## 2024-07-20 DIAGNOSIS — E785 Hyperlipidemia, unspecified: Secondary | ICD-10-CM | POA: Insufficient documentation

## 2024-07-20 DIAGNOSIS — Z7984 Long term (current) use of oral hypoglycemic drugs: Secondary | ICD-10-CM | POA: Insufficient documentation

## 2024-07-20 DIAGNOSIS — I491 Atrial premature depolarization: Secondary | ICD-10-CM | POA: Diagnosis not present

## 2024-07-20 DIAGNOSIS — M797 Fibromyalgia: Secondary | ICD-10-CM | POA: Insufficient documentation

## 2024-07-20 DIAGNOSIS — S63642A Sprain of metacarpophalangeal joint of left thumb, initial encounter: Secondary | ICD-10-CM | POA: Insufficient documentation

## 2024-07-20 DIAGNOSIS — M65842 Other synovitis and tenosynovitis, left hand: Secondary | ICD-10-CM | POA: Diagnosis not present

## 2024-07-20 DIAGNOSIS — M65312 Trigger thumb, left thumb: Secondary | ICD-10-CM | POA: Diagnosis not present

## 2024-07-20 DIAGNOSIS — I471 Supraventricular tachycardia, unspecified: Secondary | ICD-10-CM | POA: Diagnosis not present

## 2024-07-20 DIAGNOSIS — Z833 Family history of diabetes mellitus: Secondary | ICD-10-CM | POA: Diagnosis not present

## 2024-07-20 DIAGNOSIS — M180 Bilateral primary osteoarthritis of first carpometacarpal joints: Secondary | ICD-10-CM | POA: Diagnosis present

## 2024-07-20 LAB — GLUCOSE, CAPILLARY
Glucose-Capillary: 164 mg/dL — ABNORMAL HIGH (ref 70–99)
Glucose-Capillary: 58 mg/dL — ABNORMAL LOW (ref 70–99)
Glucose-Capillary: 80 mg/dL (ref 70–99)

## 2024-07-20 MED ORDER — ACETAMINOPHEN 500 MG PO TABS
ORAL_TABLET | ORAL | Status: AC
  Filled 2024-07-20: qty 2

## 2024-07-20 MED ORDER — CLONIDINE HCL (ANALGESIA) 100 MCG/ML EP SOLN
EPIDURAL | Status: DC | PRN
  Administered 2024-07-20: 50 ug

## 2024-07-20 MED ORDER — FENTANYL CITRATE (PF) 100 MCG/2ML IJ SOLN
INTRAMUSCULAR | Status: AC
  Filled 2024-07-20: qty 2

## 2024-07-20 MED ORDER — ONDANSETRON HCL 4 MG/2ML IJ SOLN
INTRAMUSCULAR | Status: DC | PRN
  Administered 2024-07-20: 4 mg via INTRAVENOUS

## 2024-07-20 MED ORDER — LIDOCAINE-EPINEPHRINE (PF) 1 %-1:200000 IJ SOLN
INTRAMUSCULAR | Status: AC
  Filled 2024-07-20: qty 30

## 2024-07-20 MED ORDER — ACETAMINOPHEN 500 MG PO TABS
1000.0000 mg | ORAL_TABLET | Freq: Once | ORAL | Status: AC
  Administered 2024-07-20: 1000 mg via ORAL

## 2024-07-20 MED ORDER — PROPOFOL 500 MG/50ML IV EMUL
INTRAVENOUS | Status: DC | PRN
  Administered 2024-07-20: 75 ug/kg/min via INTRAVENOUS

## 2024-07-20 MED ORDER — DEXAMETHASONE SOD PHOSPHATE PF 10 MG/ML IJ SOLN
INTRAMUSCULAR | Status: DC | PRN
  Administered 2024-07-20: 5 mg via INTRAVENOUS

## 2024-07-20 MED ORDER — CEFAZOLIN SODIUM-DEXTROSE 2-4 GM/100ML-% IV SOLN
INTRAVENOUS | Status: AC
  Filled 2024-07-20: qty 100

## 2024-07-20 MED ORDER — MIDAZOLAM HCL (PF) 2 MG/2ML IJ SOLN
1.0000 mg | Freq: Once | INTRAMUSCULAR | Status: AC
  Administered 2024-07-20: 1 mg via INTRAVENOUS

## 2024-07-20 MED ORDER — MIDAZOLAM HCL 2 MG/2ML IJ SOLN
INTRAMUSCULAR | Status: AC
  Filled 2024-07-20: qty 2

## 2024-07-20 MED ORDER — DEXTROSE 50 % IV SOLN
1.0000 | Freq: Once | INTRAVENOUS | Status: AC
  Administered 2024-07-20: 50 mL via INTRAVENOUS

## 2024-07-20 MED ORDER — CEFAZOLIN SODIUM-DEXTROSE 2-4 GM/100ML-% IV SOLN
2.0000 g | INTRAVENOUS | Status: AC
  Administered 2024-07-20: 2 g via INTRAVENOUS

## 2024-07-20 MED ORDER — OXYCODONE HCL 5 MG/5ML PO SOLN: 5.0000 mg | Freq: Once | ORAL | Status: DC | PRN

## 2024-07-20 MED ORDER — LACTATED RINGERS IV SOLN: INTRAVENOUS | Status: DC

## 2024-07-20 MED ORDER — 0.9 % SODIUM CHLORIDE (POUR BTL) OPTIME
TOPICAL | Status: DC | PRN
  Administered 2024-07-20: 1000 mL

## 2024-07-20 MED ORDER — OXYCODONE HCL 5 MG PO TABS: 5.0000 mg | ORAL_TABLET | Freq: Once | ORAL | Status: DC | PRN

## 2024-07-20 MED ORDER — LIDOCAINE HCL (PF) 1 % IJ SOLN
INTRAMUSCULAR | Status: AC
  Filled 2024-07-20: qty 30

## 2024-07-20 MED ORDER — DEXTROSE 50 % IV SOLN
INTRAVENOUS | Status: AC
  Filled 2024-07-20: qty 50

## 2024-07-20 MED ORDER — FENTANYL CITRATE (PF) 100 MCG/2ML IJ SOLN: 25.0000 ug | INTRAMUSCULAR | Status: DC | PRN

## 2024-07-20 MED ORDER — DEXMEDETOMIDINE HCL IN NACL 80 MCG/20ML IV SOLN
INTRAVENOUS | Status: DC | PRN
  Administered 2024-07-20: 4 ug via INTRAVENOUS

## 2024-07-20 MED ORDER — ROPIVACAINE HCL 5 MG/ML IJ SOLN
INTRAMUSCULAR | Status: DC | PRN
  Administered 2024-07-20: 30 mL via PERINEURAL

## 2024-07-20 MED ORDER — OXYCODONE HCL 5 MG PO TABS
5.0000 mg | ORAL_TABLET | Freq: Four times a day (QID) | ORAL | 0 refills | Status: DC | PRN
  Filled 2024-07-20: qty 30, 7d supply, fill #0

## 2024-07-20 MED ORDER — PROPOFOL 10 MG/ML IV BOLUS
INTRAVENOUS | Status: DC | PRN
  Administered 2024-07-20 (×2): 10 mg via INTRAVENOUS

## 2024-07-20 MED ORDER — LIDOCAINE-EPINEPHRINE 1 %-1:100000 IJ SOLN
INTRAMUSCULAR | Status: AC
  Filled 2024-07-20: qty 1

## 2024-07-20 MED ORDER — FENTANYL CITRATE (PF) 100 MCG/2ML IJ SOLN
50.0000 ug | Freq: Once | INTRAMUSCULAR | Status: AC
  Administered 2024-07-20: 50 ug via INTRAVENOUS

## 2024-07-20 MED ORDER — DROPERIDOL 2.5 MG/ML IJ SOLN: 0.6250 mg | Freq: Once | INTRAMUSCULAR | Status: DC | PRN

## 2024-07-20 NOTE — Op Note (Signed)
 NAME: Jill Richardson MEDICAL RECORD NO: [id number] DATE OF BIRTH: 07/10/1955 FACILITY: [facility] LOCATION: [facility] PHYSICIAN: GILDARDO ALDERTON, MD   OPERATIVE REPORT   DATE OF PROCEDURE: 07/20/24    PREOPERATIVE DIAGNOSIS: Left thumb carpometacarpal arthritis with associated MCP hyperextension, thumb stenosing tenosynovitis   POSTOPERATIVE DIAGNOSIS: Left thumb carpometacarpal arthritis with associated MCP hyperextension, thumb stenosing tenosynovitis   PROCEDURE: Left thumb carpometacarpal arthroplasty Left thumb metacarpophalangeal joint capsulodesis and pinning Left thumb A1 pulley release for stenosing tenosynovitis   SURGEON:  Gildardo Alderton, M.D.   ASSISTANT: Joesph Dinsmore, OPA   ANESTHESIA:  Regional with sedation   INTRAVENOUS FLUIDS:  Per anesthesia flow sheet.   ESTIMATED BLOOD LOSS:  Minimal.   COMPLICATIONS:  None.   SPECIMENS:  none   TOURNIQUET TIME:    Total Tourniquet Time Documented: Upper Arm (Left) - 48 minutes Total: Upper Arm (Left) - 48 minutes    DISPOSITION:  Stable to PACU.   INDICATIONS: 68 year old female who was seen in the outpatient setting and found to have ongoing left thumb carpometacarpal arthritis that was refractory to conservative care.  Given the chronicity of her problem, patient had began to develop ongoing MCP hyperextension as well, approximately 20 to 25 degrees.  Patient was also complaining of ongoing clicking and locking of the digit consistent with stenosing tenosynovitis of the left thumb.  Given her symptoms refractory to conservative care, patient is indicated for a left thumb CMC arthroplasty with associated MCP capsulodesis and pinning with A1 pulley release.  Risks and benefits of surgery were discussed including the risks of infection, bleeding, scarring, stiffness, nerve injury, vascular injury, tendon injury, need for subsequent operation, thumb subsidence, instability, progressive arthritis, recurrence.   She voiced understanding of these risks and elected to proceed.  OPERATIVE COURSE: Patient was seen and identified in the preoperative area and marked appropriately.  Surgical consent had been signed. Preoperative IV antibiotic prophylaxis was given. She was transferred to the operating room and placed in supine position with the left upper extremity on an arm board.  Sedation was induced by the anesthesiologist. A regional block had been performed by anesthesia in preoperative holding.    Left upper extremity was prepped and draped in normal sterile orthopedic fashion.  A surgical pause was performed between the surgeons, anesthesia, and operating room staff and all were in agreement as to the patient, procedure, and site of procedure.  Tourniquet was placed and padded appropriately to the left upper arm.  A straight line incision was made over the dorsum of the left thumb CMC joint at the glabrous/nonglabrous junction.  Crossing branches of the radial sensory nerve were identified and carefully protected.  The radial artery was identified and protected.  The first extensor compartment was first released.  The tendons of the first extensor compartment were identified within the incisional site.  The dorsal most aspect of the tendon sheath was then released and carried down over the radial styloid.  Following release of the first extensor compartment, CMC arthrotomy was completed.  Thick capsular flaps were elevated both radially and ulnarly.  The trapezium was identified and confirmed with biplanar fluoroscopy.  This was carefully freed and dissected, then removed utilizing a rongeur.  Care was taken to avoid injury to the underlying flexor carpi radialis tendon.  Following complete trapeziectomy, the scaphoid trapezoid joint was inspected.  There was noted to be cartilage loss and degenerative changes at the joint involving the articular surface of the trapezoid, consequently a  partial excision of the  trapezoid was performed.  At this point, the Arthrex internal brace was utilized.  Base of the index metacarpal was identified utilizing biplanar fluoroscopy, the K wire was inserted into the nonarticular portion followed by drill and the first suture anchor.  Biplanar fluoroscopy was utilized to confirm appropriate positioning of the wire prior to anchor placement.  Poor stability was noted to the suture anchor given the weak bone.  Instead, we proceeded with tendon transfer suspensionplasty. At this stage in the procedure, the FCR tendon was identified and the depth of the wound.  The APL tendons were identified at their distal extent overlying the base the metacarpal.  An 0 FiberWire was utilized to create a tendon transfer between the APL tendon slips and the FCR with traction on the thumb.  Excellent stability was noted.  The void created by the trapeziectomy was filled with a Gelfoam.  The capsular flaps were repaired utilizing 3-0 Vicryl suture in figure-of-eight fashion.  Given the ongoing MCP hyperextension approximately 25 degrees, decision was made to perform thumb MCP capsulodesis with pinning.  Transverse incision was designed over the MCP region.  Digital nerves were protected, A1 pulley was identified and divided utilizing a Beaver blade.  This was done to address the ongoing stenosing tenosynovitis as well.  FPL was retracted radially to expose the underlying volar plate.  Midportion of the volar plate was identified, step cut incision was designed utilizing a Beaver blade.  Distal limb of the transverse cut across the volar plate was performed with a proximal limb cut longitudinally, this left 2 overlapping capsular flaps.  Distal flap was advanced proximally to shorten the volar plate.  MCP was held in approximately 10 degrees of flexion and the overlap was secured with 3-0 FiberWire.  In order to augment the stability, 0.062 K wire was utilized and introduced across the MCP joint in antegrade  fashion.  Joint was held and stabilized in slight flexion.  Biplanar fluoroscopy was used to confirm appropriate wire placement.  Wire was then bent and cut appropriately, pin cap was utilized.   The tourniquet was deflated at 48 minutes.  Fingertips were pink with brisk capillary refill after deflation of tourniquet.  Copious irrigation was performed.  Thumb CMC incision was closed in layers utilizing 3-0 Monocryl for the subcutaneous tissue and a 4-0 running Monocryl for the skin surface.  Dermabond was placed.    4-0 nylon was utilized for the MCP excision volarly.  Sterile dressings were provided followed by application of a thumb spica splint utilizing plaster.  The operative drapes were broken down.  The patient was awoken from anesthesia safely and taken to PACU in stable condition.    Post-operative plan: The patient will recover in the post-anesthesia care unit and then be discharged home.  The patient will be non weight bearing on the left upper extremity in a spica splint utilizing plaster.   I will see the patient back in the office in 2 weeks for postoperative followup.    Artrell Lawless, MD Electronically signed, 07/20/24

## 2024-07-20 NOTE — Anesthesia Preprocedure Evaluation (Signed)
"                                    Anesthesia Evaluation  Patient identified by MRN, date of birth, ID band Patient awake    Reviewed: Allergy & Precautions, NPO status , Patient's Chart, lab work & pertinent test results  History of Anesthesia Complications (+) PONV and history of anesthetic complications  Airway Mallampati: II  TM Distance: >3 FB Neck ROM: Full    Dental  (+) Dental Advisory Given   Pulmonary neg pulmonary ROS   breath sounds clear to auscultation       Cardiovascular hypertension, Pt. on medications + dysrhythmias Supra Ventricular Tachycardia  Rhythm:Regular Rate:Normal     Neuro/Psych  Neuromuscular disease    GI/Hepatic Neg liver ROS,GERD  ,,  Endo/Other  diabetes, Type 2, Oral Hypoglycemic Agents    Renal/GU Renal disease     Musculoskeletal  (+)  Fibromyalgia -  Abdominal   Peds  Hematology  (+) Blood dyscrasia, anemia   Anesthesia Other Findings   Reproductive/Obstetrics                              Anesthesia Physical Anesthesia Plan  ASA: 2  Anesthesia Plan: Regional and MAC   Post-op Pain Management: Tylenol  PO (pre-op)* and Regional block*   Induction:   PONV Risk Score and Plan: 3 and Propofol  infusion, Ondansetron  and Treatment may vary due to age or medical condition  Airway Management Planned: Natural Airway and Simple Face Mask  Additional Equipment:   Intra-op Plan:   Post-operative Plan:   Informed Consent: I have reviewed the patients History and Physical, chart, labs and discussed the procedure including the risks, benefits and alternatives for the proposed anesthesia with the patient or authorized representative who has indicated his/her understanding and acceptance.       Plan Discussed with:   Anesthesia Plan Comments:         Anesthesia Quick Evaluation  "

## 2024-07-20 NOTE — Anesthesia Postprocedure Evaluation (Signed)
"   Anesthesia Post Note  Patient: Jill Richardson  Procedure(s) Performed: ARTHROPLASTY, FINGER (Left: Finger) RELEASE, A1 PULLEY, FOR TRIGGER FINGER (Left: Hand)     Patient location during evaluation: PACU Anesthesia Type: Regional Level of consciousness: awake and alert Pain management: pain level controlled Vital Signs Assessment: post-procedure vital signs reviewed and stable Respiratory status: spontaneous breathing, nonlabored ventilation, respiratory function stable and patient connected to nasal cannula oxygen Cardiovascular status: stable and blood pressure returned to baseline Postop Assessment: no apparent nausea or vomiting Anesthetic complications: no   No notable events documented.  Last Vitals:  Vitals:   07/20/24 0915 07/20/24 0930  BP: (!) 145/77 (!) 143/78  Pulse: 65 61  Resp: 14 18  Temp:    SpO2: 91% 93%    Last Pain:  Vitals:   07/20/24 0930  TempSrc:   PainSc: 0-No pain                 Epifanio Lamar BRAVO      "

## 2024-07-20 NOTE — Transfer of Care (Signed)
 Immediate Anesthesia Transfer of Care Note  Patient: Jill Richardson  Procedure(s) Performed: ARTHROPLASTY, FINGER (Left: Finger) RELEASE, A1 PULLEY, FOR TRIGGER FINGER (Left: Hand)  Patient Location: PACU  Anesthesia Type:MAC and Regional  Level of Consciousness: awake and patient cooperative  Airway & Oxygen Therapy: Patient Spontanous Breathing  Post-op Assessment: Report given to RN and Post -op Vital signs reviewed and stable  Post vital signs: Reviewed and stable  Last Vitals:  Vitals Value Taken Time  BP 126/69 07/20/24 09:03  Temp 36.2 C 07/20/24 09:03  Pulse 74 07/20/24 09:04  Resp 14 07/20/24 09:04  SpO2 92 % 07/20/24 09:04  Vitals shown include unfiled device data.  Last Pain:  Vitals:   07/20/24 0637  TempSrc: Temporal  PainSc: 3          Complications: No notable events documented.

## 2024-07-20 NOTE — Discharge Instructions (Addendum)
 "   Hand Surgery Postop Instructions   Dressings: Maintain postoperative dressing until orthopedic follow-up.  Keep operative site clean and dry until orthopedic follow-up.  Wound Care: Keep your hand elevated above the level of your heart.  Do not allow it to dangle by your side. Moving your fingers is advised to stimulate circulation but will depend on the site of your surgery.  If you have a splint applied, your doctor will advise you regarding movement.  Activity: Do not drive or operate machinery until clearance given from physician. No heavy lifting with operative extremity.  Diet:  Drink liquids today or eat a light diet.  You may resume a regular diet tomorrow.    General expectations: Take prescribed medication if given, transition to over-the-counter medication as quickly as possible. Fingers may become slightly swollen.  Call your doctor if any of the following occur: Severe pain not relieved by pain medication. Elevated temperature. Dressing soaked with blood. Inability to move fingers. White or bluish color to fingers.   Per [facility] clinic policy, our goal is ensure optimal postoperative pain control with a multimodal pain management strategy. For all [facility] patients, our goal is to wean post-operative narcotic medications by 6 weeks post-operatively. If this is not possible due to utilization of pain medication prior to surgery, your [facility] doctor will support your acute post-operative pain control for the first 6 weeks postoperatively, with a plan to transition you back to your primary pain team following that. Jill Richardson will work to ensure a therapist, occupational.  Jill Richardson, M.D. Hand Surgery [facility]   No Tylenol  until after 12:45pm today.   Post Anesthesia Home Care Instructions  Activity: Get plenty of rest for the remainder of the day. A responsible individual must stay with you for 24 hours following the procedure.  For the  next 24 hours, DO NOT: -Drive a car -Advertising copywriter -Drink alcoholic beverages -Take any medication unless instructed by your physician -Make any legal decisions or sign important papers.  Meals: Start with liquid foods such as gelatin or soup. Progress to regular foods as tolerated. Avoid greasy, spicy, heavy foods. If nausea and/or vomiting occur, drink only clear liquids until the nausea and/or vomiting subsides. Call your physician if vomiting continues.  Special Instructions/Symptoms: Your throat may feel dry or sore from the anesthesia or the breathing tube placed in your throat during surgery. If this causes discomfort, gargle with warm salt water. The discomfort should disappear within 24 hours.      Regional Anesthesia Blocks  1. You may not be able to move or feel the blocked extremity after a regional anesthetic block. This may last may last from 3-48 hours after placement, but it will go away. The length of time depends on the medication injected and your individual response to the medication. As the nerves start to wake up, you may experience tingling as the movement and feeling returns to your extremity. If the numbness and inability to move your extremity has not gone away after 48 hours, please call your surgeon.   2. The extremity that is blocked will need to be protected until the numbness is gone and the strength has returned. Because you cannot feel it, you will need to take extra care to avoid injury. Because it may be weak, you may have difficulty moving it or using it. You may not know what position it is in without looking at it while the block is in effect.  3. For blocks  in the legs and feet, returning to weight bearing and walking needs to be done carefully. You will need to wait until the numbness is entirely gone and the strength has returned. You should be able to move your leg and foot normally before you try and bear weight or walk. You will need someone to  be with you when you first try to ensure you do not fall and possibly risk injury.  4. Bruising and tenderness at the needle site are common side effects and will resolve in a few days.  5. Persistent numbness or new problems with movement should be communicated to the surgeon or the [facility] [phone] [facility] [phone]). "

## 2024-07-20 NOTE — Anesthesia Procedure Notes (Signed)
 Anesthesia Regional Block: Axillary brachial plexus block   Pre-Anesthetic Checklist: , timeout performed,  Correct Patient, Correct Site, Correct Laterality,  Correct Procedure, Correct Position, site marked,  Risks and benefits discussed,  Surgical consent,  Pre-op evaluation,  At surgeon's request and post-op pain management  Laterality: Left  Prep: chloraprep       Needles:  Injection technique: Single-shot  Needle Type: Echogenic Needle     Needle Length: 9cm  Needle Gauge: 21     Additional Needles:   Procedures:,,,, ultrasound used (permanent image in chart),,     Nerve Stimulator or Paresthesia:  Response: MC, Radial, Median and ulnar responses eliceted, 0.5 mA  Additional Responses:   Narrative:  Start time: 07/20/2024 6:58 AM End time: 07/20/2024 7:06 AM Injection made incrementally with aspirations every 5 mL.  Performed by: Personally  Anesthesiologist: Epifanio Charleston, MD

## 2024-07-20 NOTE — Progress Notes (Signed)
Assisted Dr. Rob Fitzgerald with left, axillary, ultrasound guided block. Side rails up, monitors on throughout procedure. See vital signs in flow sheet. Tolerated Procedure well. 

## 2024-07-20 NOTE — H&P (Signed)
 " @LOGODEPT @  Jill Richardson - 69 y.o. female MRN [id number]  Date of birth: 17-Feb-1956   HAND SURGERY H&P UPDATE   HPI: Patient is a 69 y.o. 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  Patient denies any changes to their medical history or new systemic symptoms today.    Past Medical History:  Diagnosis Date   Anemia    Breast cancer (HCC) 2008   Chronic kidney disease    Complication of anesthesia    Diabetes mellitus without complication (HCC)    Dysrhythmia    hx SVT   Fibromyalgia    GERD (gastroesophageal reflux disease)    Hyperlipidemia    Hypertension    Personal history of radiation therapy    left breast Ca   PONV (postoperative nausea and vomiting)    S/P lumbar fusion 2005   L5-S1   SVT (supraventricular tachycardia)    Umbilical hernia    Past Surgical History:  Procedure Laterality Date   BREAST LUMPECTOMY Left 2008   malignant   INSERTION OF MESH N/A 03/21/2018   Procedure: INSERTION OF MESH;  Surgeon: Belinda Cough, MD;  Location: [facility];  Service: General;  Laterality: N/A;  LMA   LUMBAR FUSION  2005   OVARY REMOVED  1997   UMBILICAL HERNIA REPAIR N/A 03/21/2018   Procedure: HERNIA REPAIR UMBILICAL ADULT WITH POSSIBLE MESH;  Surgeon: Belinda Cough, MD;  Location: [facility];  Service: General;  Laterality: N/A;  LMA   Social History   Socioeconomic History   Marital status: Widowed    Spouse name: Not on file   Number of children: Not on file   Years of education: Not on file   Highest education level: Master's degree (e.g., MA, MS, MEng, MEd, MSW, MBA)  Occupational History   Not on file  Tobacco Use   Smoking status: Never   Smokeless tobacco: Never  Substance and Sexual Activity   Alcohol use: No    Alcohol/week: 0.0 standard drinks of alcohol   Drug use: No   Sexual activity: Not on file  Other Topics Concern   Not on file   Social History Narrative   Widowed since October 2023. 2 adult sons. Has grandchildren. 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       Family history: Father with history of diabetes and hypertension died from complications of heart failure.  Mother still living.  Brother with history of hypertension.  Sister in good health.   Social Drivers of Health   Tobacco Use: Low Risk (07/12/2024)   Patient History    Smoking Tobacco Use: Never    Smokeless Tobacco Use: Never    Passive Exposure: Not on file  Financial Resource Strain: Low Risk (02/10/2024)   Overall Financial Resource Strain (CARDIA)    Difficulty of Paying Living Expenses: Not hard at all  Food Insecurity: No Food Insecurity (02/10/2024)   Epic    Worried About Programme Researcher, Broadcasting/film/video in the Last Year: Never true    Ran Out of Food in the Last Year: Never true  Transportation Needs: No Transportation Needs (02/10/2024)   Epic    Lack of Transportation (Medical): No    Lack of Transportation (Non-Medical): No  Physical Activity: Insufficiently Active (02/10/2024)   Exercise Vital Sign    Days of Exercise  per Week: 3 days    Minutes of Exercise per Session: 20 min  Stress: No Stress Concern Present (02/10/2024)   [employer] of Occupational Health - Occupational Stress Questionnaire    Feeling of Stress: Only a little  Social Connections: Moderately Integrated (02/10/2024)   Social Connection and Isolation Panel    Frequency of Communication with Friends and Family: More than three times a week    Frequency of Social Gatherings with Friends and Family: Once a week    Attends Religious Services: More than 4 times per year    Active Member of [employer] or Organizations: Yes    Attends Banker Meetings: 1 to 4 times per year    Marital Status: Widowed  Depression (PHQ2-9): Low Risk (02/14/2024)   Depression (PHQ2-9)    PHQ-2  Score: 2  Alcohol Screen: Low Risk (02/10/2024)   Alcohol Screen    Last Alcohol Screening Score (AUDIT): 1  Housing: Low Risk (02/10/2024)   Epic    Unable to Pay for Housing in the Last Year: No    Number of Times Moved in the Last Year: 0    Homeless in the Last Year: No  Utilities: Not At Risk (08/16/2023)   AHC Utilities    Threatened with loss of utilities: No  Health Literacy: Adequate Health Literacy (08/16/2023)   B1300 Health Literacy    Frequency of need for help with medical instructions: Never   Family History  Problem Relation Age of Onset   Hypertension Mother    Diabetes Father    Hypertension Father        pulmonary HTN   Cancer Father    Kidney disease Father    Diabetes Sister    Hypertension Brother    Breast cancer Neg Hx    - negative except otherwise stated in the family history section Allergies[1] Prior to Admission medications  Medication Sig Start Date End Date Taking? Authorizing Provider  amLODipine  (NORVASC ) 5 MG tablet Take 1 tablet (5 mg total) by mouth daily. 09/05/23  Yes Baxley, Ronal PARAS, MD  calcium -vitamin D  (OSCAL WITH D) 500-200 MG-UNIT per tablet Take 1 tablet by mouth 2 (two) times daily.   Yes [provider]  fenofibrate  160 MG tablet Take 1 tablet (160 mg total) by mouth daily. 01/24/24  Yes Baxley, Ronal PARAS, MD  ferrous sulfate  325 (65 FE) MG tablet Take 325 mg by mouth 2 (two) times daily. 09/09/23  Yes Lewis, Dequincy A, MD  gabapentin  (NEURONTIN ) 300 MG capsule Take 1 capsule (300 mg total) by mouth at bedtime. 12/06/23  Yes   glucosamine-chondroitin 500-400 MG tablet Take 1 tablet by mouth daily.   Yes [provider]  LORazepam  (ATIVAN ) 2 MG tablet Take 1 tablet (2 mg total) by mouth at bedtime. 12/26/23  Yes Baxley, Ronal PARAS, MD  losartan  (COZAAR ) 100 MG tablet Take 1 tablet (100 mg total) by mouth daily. 02/14/24  Yes Baxley, Ronal PARAS, MD  metformin  (FORTAMET ) 500 MG (OSM) 24 hr tablet Take 500 mg by mouth daily with  breakfast.   Yes [provider]  Multiple Vitamin (MULTIVITAMIN) capsule Take 1 capsule by mouth daily.   Yes [provider]  pantoprazole  (PROTONIX ) 40 MG tablet Take 1 tablet (40 mg total) by mouth daily. 08/04/23  Yes Baxley, Ronal PARAS, MD  rosuvastatin  (CRESTOR ) 10 MG tablet Take 1 tablet (10 mg total) by mouth daily. 09/05/23  Yes BaxleyRonal PARAS, MD  sitaGLIPtin  (JANUVIA ) 100 MG tablet  Take 100 mg by mouth daily.   Yes [provider]  valACYclovir  (VALTREX ) 500 MG tablet Take 1 tablet (500 mg total) by mouth daily for 5 days 02/14/24  Yes Baxley, Ronal PARAS, MD  venlafaxine  XR (EFFEXOR -XR) 75 MG 24 hr capsule Take 1 capsule (75 mg total) by mouth daily with breakfast. 01/24/24  Yes Baxley, Ronal PARAS, MD  blood glucose meter kit and supplies Use to check blood sugar up to 4 times daily as directed 02/02/22   Perri Ronal PARAS, MD  Continuous Glucose Receiver (FREESTYLE LIBRE 3 READER) DEVI Use as directed to continuously monitor blood glucose 08/16/23     Continuous Glucose Sensor (FREESTYLE LIBRE 3 PLUS SENSOR) MISC Use to check blood sugar continuously. Change sensor every 15 days. Dx E11.9 08/26/23   Perri Ronal PARAS, MD  cyclobenzaprine  (FLEXERIL ) 10 MG tablet Take 1 tablet (10 mg total) by mouth 3 (three) times daily as needed for muscle spasms. 03/18/23   Joshua Alm Hamilton, MD  HYDROcodone -acetaminophen  (NORCO/VICODIN) 5-325 MG tablet Take 1 tablet by mouth every 6 (six) hours as needed for pain 03/29/23     tirzepatide  (MOUNJARO ) 2.5 MG/0.5ML Pen Inject 2.5 mg into the skin once a week for 4 weeks and then increase to 5 mg weekly. 06/12/24   Thapa, Sudan, MD  tirzepatide  (MOUNJARO ) 5 MG/0.5ML Pen Inject 5 mg into the skin once a week. After completion of 4 weeks of 2.5mg  dose. 06/12/24   Thapa, Sudan, MD  traMADol  (ULTRAM ) 50 MG tablet Take 1 tablet (50 mg total) by mouth every 6 (six) hours as needed. 08/16/23   Perri Ronal PARAS, MD   No results found. - Positive ROS: All other  systems have been reviewed and were otherwise negative with the exception of those mentioned in the HPI and as above.   General: Patient is well appearing and in no distress.    Skin and Muscle: No skin changes are apparent to upper extremities.  Muscle bulk and contour normal, no signs of atrophy.      Range of Motion and Palpation Tests: Mobility is full about the elbows with flexion and extension.  Forearm supination and pronation are 85/85 bilaterally.  Wrist flexion/extension is 75/65 bilaterally.  Digital flexion and extension are full.  Thumb opposition is full to the base of the small fingers bilaterally.     No cords or nodules are palpated.  No triggering is observed.     Significant tenderness over the left thumb CMC articulation is observed, positive grind for pain, positive crepitus.  MP hyperextension positive, approximately 25 degrees.      Neurologic, Vascular, Motor: Sensation is intact to light touch in the median/radial/ulnar distributions.  Tinel's testing negative at wrist level.   Fingers pink and well perfused.  Capillary refill is brisk.     Assessment/Plan: OR today for left thumb carpometacarpal arthroplasty with associated MCP capsulodesis and pinning, trigger thumb release. We again reviewed the risks of surgery which include bleeding, infection, damage to neurovascular structures, persistent symptoms, need for additional surgery.  Informed consent was signed.  All questions were answered.   Joelly Bolanos [facility], Hand Surgery     [1]  Allergies Allergen Reactions   Lipitor  [Atorvastatin] Other (See Comments)    Muscle aches    Lodine [Etodolac] Other (See Comments)    STEVENS JOHNSONS    "

## 2024-07-23 ENCOUNTER — Other Ambulatory Visit (HOSPITAL_COMMUNITY): Payer: Self-pay

## 2024-07-23 ENCOUNTER — Other Ambulatory Visit: Payer: Self-pay | Admitting: Internal Medicine

## 2024-07-24 ENCOUNTER — Encounter (HOSPITAL_BASED_OUTPATIENT_CLINIC_OR_DEPARTMENT_OTHER): Payer: Self-pay | Admitting: Orthopedic Surgery

## 2024-07-24 ENCOUNTER — Other Ambulatory Visit: Payer: Self-pay

## 2024-07-24 MED ORDER — PANTOPRAZOLE SODIUM 40 MG PO TBEC
40.0000 mg | DELAYED_RELEASE_TABLET | Freq: Every day | ORAL | 3 refills | Status: AC
  Filled 2024-07-24: qty 90, 90d supply, fill #0

## 2024-08-01 NOTE — Therapy (Signed)
 " OUTPATIENT OCCUPATIONAL THERAPY ORTHO EVALUATION  Patient Name: Jill Richardson MRN: [id number] DOB:[birth year], 69 y.o., female Today's Date: 08/02/2024  PCP: Perri BATTLE MD REFERRING PROVIDER: Dr Arlinda  END OF SESSION:  OT End of Session - 08/02/24 1057     Visit Number 1    Number of Visits 10    Date for Recertification  09/28/24    Authorization Type [employer]    OT Start Time 1058    OT Stop Time 1151    OT Time Calculation (min) 53 min    Equipment Utilized During Treatment orthotic materials    Activity Tolerance Patient tolerated treatment well;No increased pain;Patient limited by fatigue;Patient limited by pain    Behavior During Therapy [facility] for tasks assessed/performed          Past Medical History:  Diagnosis Date   Anemia    Breast cancer (HCC) 2008   Chronic kidney disease    Complication of anesthesia    Diabetes mellitus without complication (HCC)    Dysrhythmia    hx SVT   Fibromyalgia    GERD (gastroesophageal reflux disease)    Hyperlipidemia    Hypertension    Personal history of radiation therapy    left breast Ca   PONV (postoperative nausea and vomiting)    S/P lumbar fusion 2005   L5-S1   SVT (supraventricular tachycardia)    Umbilical hernia    Past Surgical History:  Procedure Laterality Date   BREAST LUMPECTOMY Left 2008   malignant   FINGER ARTHROPLASTY Left 07/20/2024   Procedure: ARTHROPLASTY, FINGER;  Surgeon: Arlinda Buster, MD;  Location: [facility];  Service: Orthopedics;  Laterality: Left;  LEFT THUMB CARPOMETACARPAL  ARTHROPLASTY WITH INTERNAL BRACE WITH THUMB METACARPOPHALANGEAL  CAPSULODESIS AND PINNING VS POSSIBLE METACARPOPHALANGEAL  FUSION AND TRIGGER THUMB RELEASE   INSERTION OF MESH N/A 03/21/2018   Procedure: INSERTION OF MESH;  Surgeon: Belinda Cough, MD;  Location: [facility];  Service: General;  Laterality: N/A;  LMA   LUMBAR FUSION  2005   OVARY REMOVED  1997   TRIGGER FINGER  RELEASE Left 07/20/2024   Procedure: RELEASE, A1 PULLEY, FOR TRIGGER FINGER;  Surgeon: Arlinda Buster, MD;  Location: [facility];  Service: Orthopedics;  Laterality: Left;   UMBILICAL HERNIA REPAIR N/A 03/21/2018   Procedure: HERNIA REPAIR UMBILICAL ADULT WITH POSSIBLE MESH;  Surgeon: Belinda Cough, MD;  Location: [facility];  Service: General;  Laterality: N/A;  LMA   Patient Active Problem List   Diagnosis Date Noted   Arthritis of carpometacarpal [facility]) joint of left thumb 07/20/2024   Trigger finger of left thumb 07/20/2024   Spondylolisthesis at L4-L5 level 03/16/2023   Anemia in chronic kidney disease 09/08/2022   Essential hypertension 01/26/2019   Menopausal syndrome 10/01/2015   Depression 08/22/2012   Insomnia 08/22/2012   Metabolic syndrome 04/25/2012   Type 2 diabetes mellitus (HCC) 11/28/2011   Hyperlipidemia 12/23/2010   GE reflux 12/23/2010   Fibromyalgia 12/23/2010   Breast cancer of upper-outer quadrant of left female breast (HCC) 12/23/2010   History of supraventricular tachycardia 12/23/2010    ONSET DATE: DOS 07/20/24  REFERRING DIAG:  M18.0 (ICD-10-CM) - Arthritis of carpometacarpal (CMC) joint of both thumbs  M65.312 (ICD-10-CM) - Trigger finger of left thumb    THERAPY DIAG:  Localized edema  Pain in left hand  Muscle weakness (generalized)  Other lack of coordination  Stiffness of left hand, not elsewhere classified  Pain  in left wrist  Rationale for Evaluation and Treatment: Rehabilitation  SUBJECTIVE:   SUBJECTIVE STATEMENT: She is now ~2 weeks s/p Lt thumb CMC J arthroplasty with 1st compartment release and capsulodesis. She states she has had high pain in the past week up to 10 out of 10, she also states a long history of arthritis, triggering thumb, tendinitis in her wrist, and other issues.  She is retired copy.   Pt accompanied by: her son  PERTINENT HISTORY: Pt had Lt thumb CMC J  arthroplasty (tendon suspension), deQuervain's release, A1 release of the thumb, and MP J capsulodesis   PRECAUTIONS: None  RED FLAGS: None   WEIGHT BEARING RESTRICTIONS: Yes nonweightbearing in left hand and arm now  PAIN:  Are you having pain? Yes: NPRS scale: 4/10 at rest, up to a 10/10 at worst in past week  Pain location: Left wrist and thumb surgical areas Pain description: Sore, aching, sometimes sharp and burning Aggravating factors: Aggressive attempted motion Relieving factors: Rest  FALLS: Has patient fallen in last 6 months? No  PLOF: Independent  PATIENT GOALS: To safely improve the use of her left hand and arm to return to normal functional activities  NEXT MD VISIT: Approximately 2 weeks for K wire removal  OBJECTIVE: (All objective assessments below are from initial evaluation on: 08/02/24 unless otherwise specified.)   HAND DOMINANCE: Right   ADLs: Overall ADLs: States decreased ability to grab, hold household objects, pain and difficulty to open containers, perform FMS tasks (manipulate fasteners on clothing), mild to moderate bathing problems as well.    FUNCTIONAL OUTCOME MEASURES: Eval: Patient Specific Functional Scale: TBD in the next session as time allows  (Higher Score  =  Better Ability for the Selected Tasks)     UPPER EXTREMITY ROM     Shoulder to Wrist AROM Lt eval  Forearm supination 73  Forearm pronation  74  Wrist flexion 35  Wrist extension 19  Wrist ulnar deviation   Wrist radial deviation   Functional dart thrower's motion (F-DTM) in ulnar flexion   F-DTM in radial extension    (Blank rows = not tested)   Hand AROM Lt Eval  Full Fist Ability (or Gap to Distal Palmar Crease) Unable to make a fist and observed coordination and motor planning problems-difficulty moving MCP Js today, and hand tends towards a claw position with nervous guarding behaviors  Thumb Opposition  (Kapandji Scale)  NT  Thumb MCP (0-60) NT  Thumb IP (0-80)  Approximately 20 degrees flexion and extension while other joints are supported  Thumb Radial Abduction Span NT  Thumb Palmar Abduction Span NT  (Blank rows = not tested)   UPPER EXTREMITY MMT:    Eval:  NT at eval due to recent and still healing injuries. Will be tested when appropriate.   MMT Lt TBD  Elbow flexion   Elbow extension   Forearm supination   Forearm pronation   Wrist flexion   Wrist extension   Wrist ulnar deviation   Wrist radial deviation   (Blank rows = not tested)  HAND FUNCTION: Eval: Observed weakness in affected Lt hand.  Details TBD when safe  Grip strength Right: TBD lbs, Left: TBD lbs   COORDINATION: Eval: Observed coordination impairments with affected left hand. Details TBD when safe  9 Hole Peg Test Right: TBDsec (TBD sec is WFL)   SENSATION: Eval:  Light touch intact today,  though diminished around sx areas, and some complaints of paresthesia and burning  nerve sensations  EDEMA:   Eval:  Mildly swollen in left hand and wrist today, compared to the right  COGNITION: Eval: Overall cognitive status: WFL for evaluation today   OBSERVATIONS:   Eval: Some nervous guarding through the fingers and hand that is inhibiting MP joint motion.  Swelling and tenderness within normal limits for postop timeframe, no signs of infection or dehiscence.   Left thumb CMC joint arthroplasty, MP joint capsulodesis with pinning, trigger thumb and de Quervain's releases  TODAY'S TREATMENT:  Post-evaluation treatment:   Custom orthotic fabrication was indicated due to pt's healing Lt thumb arthroplasty surgery and need for safe, functional positioning. OT fabricated custom forearm-based thumb spica orthosis with IP joint free for pt today to immobilize the wrist and base of thumb.  Care was taken to provide support around the pin site without contacting the pin, which took extra time in detail.  It fit well with no areas of pressure, pt states a comfortable fit. Pt  was educated on the wearing schedule (on at all times except for hygiene and exercises), to avoid exposing it to sources of heat, to wipe clean as needed (do not wash, use harsh detergents), to call or come in ASAP if it is causing any irritation or is not achieving desired function. It will be checked/adjusted in upcoming sessions, as needed. Pt states understanding all directions.    For safety/self-care, the patient was recommended to do no pushing/pulling or weightbearing through the Lt hand or arm now.  The patient was taught to care for the postsurgical wound by doing light touch desensitization, gentle scar mobilizations, keep moist with a Vaseline, keep covered until completely healed.  The patient should not be soaking the wound, either.  The patient can quickly shower and dab dry.  The patient was given a compressive stockinette to help with swelling.  The patient was also given the following home exercise program to perform in a nonpainful fashion approximately 4 times a day.  Each one was reviewed with the patient, with performance back to show understanding and no significant pain.  The patient leaves without any questions or concerns.  Exercises performed and explained today: - Reach arms upward   - 4 x daily - 10 reps - Turn [employer] Facing Up & Down  - 4-6 x daily - 10-15 reps - Bend and Pull Back Wrist SLOWLY  - 4 x daily - 10-15 reps - Tendon Glides  - 4-6 x daily - 3-5 reps - 2-3 seconds hold - Thumb AROM IP Blocking  - 4-6 x daily - 10-15 reps Patient Education - Scar Massage    PATIENT EDUCATION: Education details: See tx section above for details  Person educated: Patient Education method: Verbal Instruction, Teach back, Handouts  Education comprehension: States and demonstrates understanding, Additional Education required    HOME EXERCISE PROGRAM: Access Code: [email or web address] URL: [email or web address] Date: 08/02/2024 Prepared by: Melvenia Ada    GOALS: Goals reviewed with patient? Yes   SHORT TERM GOALS: (STG required if POC>30 days) Target Date: 08/24/24  Pt will obtain protective, custom orthotic. Goal status: 08/02/2024: MET, but will need a new orthosis in approximately 2 weeks that allows wrist motion  2.  Pt will demo/state understanding of initial HEP to improve pain levels and prerequisite motion. Goal status: INITIAL   LONG TERM GOALS: Target Date: 09/28/24  Pt will improve functional ability by decreased impairment per PSFS assessment from TBD to TBD or better, for better quality of life. Goal  status: INITIAL-TBD pending baseline measures  2.  Pt will improve grip strength in left hand from unsafe to test to at least 20 lbs for functional use at home and in IADLs. Goal status: INITIAL  3.  Pt will improve A/ROM in left wrist flexion/extension from 35/19 degrees respectively to at least 55 degrees each, to have functional motion for tasks like reach and grasp.  Goal status: INITIAL  4.  Pt will improve strength in left wrist flexion/extension from apparent 3 -/5 MMT to at least 4+/5 MMT to have increased functional ability to carry out selfcare and higher-level homecare tasks with less difficulty. Goal status: INITIAL  5.  Pt will improve coordination skills in left hand and arm, as seen by within functional limit score on nine-hole peg testing to have increased functional ability to carry out fine motor tasks (fasteners, etc.) and more complex, coordinated IADLs (meal prep, sports, etc.).  Goal status: INITIAL  6.  Pt will decrease pain at worst from 10/10 to 4/10 or better to have better sleep and occupational participation in daily roles. Goal status: INITIAL   ASSESSMENT:  CLINICAL IMPRESSION: Patient is a 69 y.o. female who was seen today for occupational therapy evaluation for stiffness, weakness, swelling, decreased coordination and functional ability with the Lt thumb, hand and arm after  chronic arthritis and recent [facility] joint arthroplasty.  She also has multiple comorbidities, K wire in place from capsulodesis, trigger thumb release and first dorsal compartment releases that complicate this surgery and recovery and made evaluation more difficult today.  The patient will benefit from outpatient occupational therapy to decrease symptoms, improve functional upper extremity use, and increase quality of life.  PERFORMANCE DEFICITS: in functional skills including ADLs, IADLs, coordination, dexterity, proprioception, sensation, edema, ROM, strength, pain, fascial restrictions, flexibility, Fine motor control, Gross motor control, body mechanics, endurance, decreased knowledge of precautions, wound, and UE functional use, cognitive skills including problem solving and safety awareness, and psychosocial skills including coping strategies, environmental adaptation, habits, and routines and behaviors.   IMPAIRMENTS: are limiting patient from ADLs, IADLs, rest and sleep, and leisure.   COMORBIDITIES: may have co-morbidities  that affects occupational performance. Patient will benefit from skilled OT to address above impairments and improve overall function.  MODIFICATION OR ASSISTANCE TO COMPLETE EVALUATION: Min-Moderate modification of tasks or assist with assess necessary to complete an evaluation.  OT OCCUPATIONAL PROFILE AND HISTORY: Detailed assessment: Review of records and additional review of physical, cognitive, psychosocial history related to current functional performance.  CLINICAL DECISION MAKING: Moderate - several treatment options, min-mod task modification necessary  REHAB POTENTIAL: Good  EVALUATION COMPLEXITY: Moderate      PLAN:  OT FREQUENCY: 1-2x/week  OT DURATION: 8 weeks through 09/28/24 and up to 10 total visits as needed   PLANNED INTERVENTIONS: 97535 self care/ADL training, 02889 therapeutic exercise, 97530 therapeutic activity, 97112 neuromuscular  re-education, 97140 manual therapy, 97035 ultrasound, 97032 electrical stimulation (manual), 97760 Orthotic Initial, [id number] Orthotic/Prosthetic subsequent, compression bandaging, Dry needling, energy conservation, coping strategies training, and patient/family education  RECOMMENDED OTHER SERVICES: none now    CONSULTED AND AGREED WITH PLAN OF CARE: Patient  PLAN FOR NEXT SESSION:   Review initial HEP and recommendations, check orthosis and upgrade to 3 weeks postop   Melvenia Ada, OTR/L, CHT  08/02/2024, 12:18 PM  "

## 2024-08-02 ENCOUNTER — Encounter: Payer: Self-pay | Admitting: Rehabilitative and Restorative Service Providers"

## 2024-08-02 ENCOUNTER — Other Ambulatory Visit: Payer: Self-pay | Admitting: Orthopedic Surgery

## 2024-08-02 ENCOUNTER — Other Ambulatory Visit: Payer: Self-pay

## 2024-08-02 ENCOUNTER — Ambulatory Visit: Admitting: Orthopedic Surgery

## 2024-08-02 ENCOUNTER — Ambulatory Visit: Admitting: Rehabilitative and Restorative Service Providers"

## 2024-08-02 ENCOUNTER — Other Ambulatory Visit (HOSPITAL_COMMUNITY): Payer: Self-pay

## 2024-08-02 DIAGNOSIS — M6281 Muscle weakness (generalized): Secondary | ICD-10-CM

## 2024-08-02 DIAGNOSIS — M25532 Pain in left wrist: Secondary | ICD-10-CM

## 2024-08-02 DIAGNOSIS — M79642 Pain in left hand: Secondary | ICD-10-CM

## 2024-08-02 DIAGNOSIS — M25642 Stiffness of left hand, not elsewhere classified: Secondary | ICD-10-CM

## 2024-08-02 DIAGNOSIS — R278 Other lack of coordination: Secondary | ICD-10-CM

## 2024-08-02 DIAGNOSIS — R600 Localized edema: Secondary | ICD-10-CM

## 2024-08-02 DIAGNOSIS — M180 Bilateral primary osteoarthritis of first carpometacarpal joints: Secondary | ICD-10-CM

## 2024-08-02 MED ORDER — OXYCODONE HCL 5 MG PO TABS
5.0000 mg | ORAL_TABLET | Freq: Four times a day (QID) | ORAL | 0 refills | Status: AC | PRN
  Filled 2024-08-02: qty 30, 8d supply, fill #0

## 2024-08-02 NOTE — Progress Notes (Unsigned)
" ° °  NICKIE WARWICK - 69 y.o. female MRN [id number]  Date of birth: Mar 14, 1956  Office Visit Note: Visit Date: 08/02/2024 PCP: Perri Ronal PARAS, MD Referred by: Perri Ronal PARAS, MD  Subjective:  HPI: TAHLIYAH ANAGNOS is a 69 y.o. female who presents today for follow up 2 weeks status post left thumb carpometacarpal arthroplasty, left thumb metacarpophalangeal joint capsulodesis and pinning and left thumb A1 pulley release for stenosing tenosynovitis.  Pertinent ROS were reviewed with the patient and found to be negative unless otherwise specified above in HPI.   Assessment & Plan: Visit Diagnoses: No diagnosis found.  Plan: ***  Follow-up: No follow-ups on file.   Meds & Orders: No orders of the defined types were placed in this encounter.  No orders of the defined types were placed in this encounter.    Procedures: No procedures performed       Objective:   Vital Signs: There were no vitals taken for this visit.  Ortho Exam ***  Imaging: No results found.   Anshul Afton Alderton, M.D. [facility], Hand Surgery  "

## 2024-08-02 NOTE — Progress Notes (Incomplete)
 "  Annual Wellness Visit   Patient Care Team: Baxley, Ronal PARAS, MD as PCP - General (Internal Medicine) Okey Arch, MD (Inactive) (Obstetrics and Gynecology) Melodye Coy, MD (Inactive) (Hematology and Oncology) Patrcia Cough, MD (Radiation Oncology) Merrilyn Handler, MD (Inactive) (General Surgery) [facility], [place], [place]  Visit Date: 08/02/24   No chief complaint on file.  Subjective:  Patient: Jill Richardson, Female DOB: 11-Jul-1955, 69 y.o. MRN: [id number] There were no vitals filed for this visit. Jill Richardson is a 69 y.o. Female who presents today for her Annual Wellness Visit. Patient has Hyperlipidemia; GE reflux; Fibromyalgia; Breast cancer of upper-outer quadrant of left female breast (HCC); History of supraventricular tachycardia; Type 2 diabetes mellitus (HCC); Metabolic syndrome; Depression; Insomnia; Menopausal syndrome; Essential hypertension; Anemia in chronic kidney disease; Spondylolisthesis at L4-L5 level; Arthritis of carpometacarpal [facility]) joint of left thumb; and Trigger finger of left thumb on their problem list.   Underwent surgery on 07/20/2024 for left finger arthroplasty.   History of L5-S1 Fusion with cages and pedicle screws by Dr. Gaither in 2005; History of Chronic Back Pain; History of L4-5 Spondylolisthesis, she is s/p PILF S1-L5, L3-4, & L4-5 on 03/16/2023. Reports she is healing generally well from this, but notes that she does start to experience soreness 5-6 hours after extended activity. Does still work a couple times/week, but she states she will likely stop working around April.    History of Essential Hypertension treated with 5 mg Amlodipine  daily and 100 mg Losartan  daily. Blood Pressure . Wears compression stockings for varicose veins.    History of Mixed Hyperlipidemia treated with 10 mg Rosuvastatin  daily and 160 mg Fenofibrate  daily.08/11/2023 Lipid Panel: WNL.    History of Type 2 diabetes mellitus treated with Januvia  100 mg daily and  Glucophage  500 mg BID.    History of Fibromyalgia treated with Flexeril  10 mg three times daily as needed and Tramadol  50 mg once every 6 hours as needed. Requesting Tramadol  be refilled today.    History of GERD treated with Protonix  40 mg daily.   History of Depression treated with Effexor -XR 75 mg daily with breakfast.   History of Chronic Kidney Disease stage 3.    History of Anemia      Labs ***/***/*** {Labs (Optional):31667}   10/18/2023 Mammogram No mammographic evidence of malignancy. Repeat in one year.     Health Maintenance  Topic Date Due   Medicare Annual Wellness (AWV)  08/15/2024   Diabetic kidney evaluation - Urine ACR  08/11/2024   FOOT EXAM  08/15/2024   Mammogram  10/12/2024   HEMOGLOBIN A1C  12/11/2024   OPHTHALMOLOGY EXAM  04/03/2025   Diabetic kidney evaluation - eGFR measurement  07/16/2025   Colonoscopy  04/26/2027   DTaP/Tdap/Td (3 - Td or Tdap) 01/15/2028   Pneumococcal Vaccine: 50+ Years  Completed   Influenza Vaccine  Completed   Bone Density Scan  Completed   Zoster Vaccines- Shingrix  Completed   Meningococcal B Vaccine  Aged Out   COVID-19 Vaccine  Discontinued   Hepatitis C Screening  Discontinued    {Man or Woman:32389}  Vaccine Counseling: Due for {Vaccines:32291::Influenza}; UTD on {Vaccines:32291::Influenza}  ROS Objective:  Vitals: body mass index is unknown because there is no height or weight on file.There were no vitals filed for this visit. Physical Exam  Current Outpatient Medications  Medication Instructions   amLODipine  (NORVASC ) 5 mg, Oral, Daily   blood glucose meter kit and supplies Use to check blood sugar up  to 4 times daily as directed   calcium -vitamin D  (OSCAL WITH D) 500-200 MG-UNIT per tablet 1 tablet, 2 times daily   Continuous Glucose Receiver (FREESTYLE LIBRE 3 READER) DEVI Use as directed to continuously monitor blood glucose   Continuous Glucose Sensor (FREESTYLE LIBRE 3 PLUS SENSOR) MISC Use to  check blood sugar continuously. Change sensor every 15 days. Dx E11.9   cyclobenzaprine  (FLEXERIL ) 10 mg, Oral, 3 times daily PRN   fenofibrate  160 mg, Oral, Daily   ferrous sulfate  325 mg, 2 times daily   gabapentin  (NEURONTIN ) 300 mg, Oral, Nightly   glucosamine-chondroitin 500-400 MG tablet 1 tablet, Daily   HYDROcodone -acetaminophen  (NORCO/VICODIN) 5-325 MG tablet Take 1 tablet by mouth every 6 (six) hours as needed for pain   LORazepam  (ATIVAN ) 2 mg, Oral, Daily at bedtime   losartan  (COZAAR ) 100 mg, Oral, Daily   metformin  (FORTAMET ) 500 mg, Daily with breakfast   Multiple Vitamin (MULTIVITAMIN) capsule 1 capsule, Daily   oxyCODONE  (ROXICODONE ) 5 mg, Oral, Every 6 hours PRN   pantoprazole  (PROTONIX ) 40 mg, Oral, Daily   rosuvastatin  (CRESTOR ) 10 mg, Oral, Daily   sitaGLIPtin  (JANUVIA ) 100 mg, Daily   tirzepatide  (MOUNJARO ) 2.5 MG/0.5ML Pen Inject 2.5 mg into the skin once a week for 4 weeks and then increase to 5 mg weekly.   tirzepatide  (MOUNJARO ) 5 mg, Subcutaneous, Weekly, After completion of 4 weeks of 2.5mg  dose.   traMADol  (ULTRAM ) 50 mg, Oral, Every 6 hours PRN   valACYclovir  (VALTREX ) 500 MG tablet Take 1 tablet (500 mg total) by mouth daily for 5 days   venlafaxine  XR (EFFEXOR -XR) 75 mg, Oral, Daily with breakfast   Past Medical History:  Diagnosis Date   Anemia    Breast cancer (HCC) 2008   Chronic kidney disease    Complication of anesthesia    Diabetes mellitus without complication (HCC)    Dysrhythmia    hx SVT   Fibromyalgia    GERD (gastroesophageal reflux disease)    Hyperlipidemia    Hypertension    Personal history of radiation therapy    left breast Ca   PONV (postoperative nausea and vomiting)    S/P lumbar fusion 2005   L5-S1   SVT (supraventricular tachycardia)    Umbilical hernia    Medical/Surgical History Narrative:  Allergic/Intolerant to: Allergies[1] *** - ***  *** - ***  *** - ***  *** - ***  *** - ***  *** - ***  *** -  ***  *** - *** Other - Hx of: *** ; Surghx of: *** Past Surgical History:  Procedure Laterality Date   BREAST LUMPECTOMY Left 2008   malignant   FINGER ARTHROPLASTY Left 07/20/2024   Procedure: ARTHROPLASTY, FINGER;  Surgeon: Arlinda Buster, MD;  Location: [facility];  Service: Orthopedics;  Laterality: Left;  LEFT THUMB CARPOMETACARPAL  ARTHROPLASTY WITH INTERNAL BRACE WITH THUMB METACARPOPHALANGEAL  CAPSULODESIS AND PINNING VS POSSIBLE METACARPOPHALANGEAL  FUSION AND TRIGGER THUMB RELEASE   INSERTION OF MESH N/A 03/21/2018   Procedure: INSERTION OF MESH;  Surgeon: Belinda Cough, MD;  Location: [facility];  Service: General;  Laterality: N/A;  LMA   LUMBAR FUSION  2005   OVARY REMOVED  1997   TRIGGER FINGER RELEASE Left 07/20/2024   Procedure: RELEASE, A1 PULLEY, FOR TRIGGER FINGER;  Surgeon: Arlinda Buster, MD;  Location: [facility];  Service: Orthopedics;  Laterality: Left;   UMBILICAL HERNIA REPAIR N/A 03/21/2018   Procedure: HERNIA REPAIR UMBILICAL ADULT WITH POSSIBLE  MESH;  Surgeon: Belinda Cough, MD;  Location: [facility];  Service: General;  Laterality: N/A;  LMA   Family History  Problem Relation Age of Onset   Hypertension Mother    Diabetes Father    Hypertension Father        pulmonary HTN   Cancer Father    Kidney disease Father    Diabetes Sister    Hypertension Brother    Breast cancer Neg Hx    Family History Narrative: {ELFamHX:31110} Social History   Social History Narrative   Widowed since October 2023. 2 adult sons. Has grandchildren. 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       Family history: Father with history of diabetes and hypertension died from complications of heart failure.  Mother still living.  Brother with history of hypertension.  Sister in good health.   Most Recent Health Risks Assessment:    Most Recent Social Determinants of Health (Including Hx of Tobacco, Alcohol, and Drug Use) SDOH Screenings   Food Insecurity: No Food Insecurity (02/10/2024)  Housing: Low Risk (02/10/2024)  Transportation Needs: No Transportation Needs (02/10/2024)  Utilities: Not At Risk (08/16/2023)  Alcohol Screen: Low Risk (02/10/2024)  Depression (PHQ2-9): Low Risk (02/14/2024)  Financial Resource Strain: Low Risk (02/10/2024)  Physical Activity: Insufficiently Active (02/10/2024)  Social Connections: Moderately Integrated (02/10/2024)  Stress: No Stress Concern Present (02/10/2024)  Tobacco Use: Low Risk (07/20/2024)  Health Literacy: Adequate Health Literacy (08/16/2023)   Social History[2] Most Recent Functional Status Assessment:    07/20/2024    6:35 AM  In your present state of health, do you have any difficulty performing the following activities:  Hearing? 0   Vision? 0   Difficulty concentrating or making decisions? 0     Data saved with a previous flowsheet row definition   Most Recent Fall Risk Assessment:    08/16/2023   11:09 AM  Fall Risk   Falls in the past year? 0  Number falls in past yr: 0  Injury with Fall? 0   Risk for fall due to : No Fall Risks  Follow up Falls prevention discussed;Education provided;Falls evaluation completed     Data saved with a previous flowsheet row definition   Most Recent Anxiety/Depression Screenings:    02/14/2024   11:21 AM 08/16/2023   11:04 AM  PHQ 2/9 Scores  PHQ - 2 Score 0 0  PHQ- 9 Score 2       Data saved with a previous flowsheet row definition      02/14/2024   11:21 AM  GAD 7 : Generalized Anxiety Score  Nervous, Anxious, on Edge 0   Control/stop worrying 0   Worry too much - different things 0   Trouble relaxing 0   Restless 0   Easily annoyed or irritable 0   Afraid - awful might happen 0   Total GAD 7 Score 0  Anxiety Difficulty Not difficult at all     Data saved with a previous flowsheet row definition   Most  Recent Cognitive Screening:    08/16/2023   11:14 AM  6CIT Screen  What Year? 0 points  What month? 0 points  What time? 0 points  Count back from 20 0 points  Months in reverse 0 points  Repeat phrase 0 points  Total Score 0 points   Most Recent Vision/Hearing Screenings:No results found. Results:  Studies Obtained And Personally Reviewed By Me: Diabetic Foot Exam - Simple   No data filed     {Imaging, colonoscopy, mammogram, bone density scan, echocardiogram, heart cath, stress test, CT calcium  score, etc.:32292}  Labs:  CBC w/ Differential Lab Results  Component Value Date   WBC 7.0 02/14/2024   RBC 4.09 02/14/2024   HGB 12.1 02/14/2024   HCT 38.7 02/14/2024   PLT 386 02/14/2024   MCV 94.6 02/14/2024   MCH 29.6 02/14/2024   MCHC 31.3 (L) 02/14/2024   RDW 14.0 02/14/2024   MPV 9.7 02/14/2024   LYMPHSABS 1.0 09/08/2023   MONOABS 0.4 09/08/2023   BASOSABS 0.1 09/08/2023    Comprehensive Metabolic Panel Lab Results  Component Value Date   NA 142 07/16/2024   K 4.6 07/16/2024   CL 107 07/16/2024   CO2 26 07/16/2024   GLUCOSE 114 (H) 07/16/2024   BUN 21 07/16/2024   CREATININE 1.11 (H) 07/16/2024   CALCIUM  9.0 07/16/2024   PROT 6.6 02/09/2024   ALBUMIN 4.0 09/08/2023   AST 19 02/09/2024   ALT 16 02/09/2024   ALKPHOS 43 09/08/2023   BILITOT 0.5 02/09/2024   EGFR 42 (L) 02/14/2024   GFRNONAA 53 (L) 07/16/2024   Lipid Panel  Lab Results  Component Value Date   CHOL 158 02/09/2024   HDL 75 02/09/2024   LDLCALC 65 02/09/2024   TRIG 98 02/09/2024   A1c Lab Results  Component Value Date   HGBA1C 5.7 (A) 06/12/2024    TSH Lab Results  Component Value Date   TSH 1.77 08/11/2023   PSA{PSA (Optional):32132} No results found for any visits on 08/16/24. Assessment & Plan:  No orders of the defined types were placed in this encounter.  No orders of the defined types were placed in this encounter.  Other Labs Reviewed today:    No follow-ups on  file.   {ELAWVOptions:31280} done today including the all of the following: Reviewed patient's Family Medical History Reviewed patient's SDOH and reviewed tobacco, alcohol, and drug use.  Reviewed and updated list of patient's medical providers Assessment of cognitive impairment was done Assessed patient's functional ability Established a written schedule for health screening services Health Risk Assessent Completed and Reviewed  Discussed health benefits of physical activity, and encouraged her to engage in regular exercise appropriate for her age and condition.   I,Emily Lagle,acting as a neurosurgeon for Ronal JINNY Hailstone, MD.,have documented all relevant documentation on the behalf of Ronal JINNY Hailstone, MD,as directed by  Ronal JINNY Hailstone, MD while in the presence of Ronal JINNY Hailstone, MD.  I, Ronal JINNY Hailstone, MD, have reviewed all documentation for and agree with the above Annual Wellness Visit documentation.  Ronal JINNY Hailstone, MD Internal Medicine 08/16/2024    [1]  Allergies Allergen Reactions   Lipitor  [Atorvastatin] Other (See Comments)    Muscle aches    Lodine [Etodolac] Other (See Comments)    STEVENS JOHNSONS   [2]  Social History Tobacco Use   Smoking status: Never   Smokeless tobacco: Never  Substance Use Topics   Alcohol use: No    Alcohol/week: 0.0 standard drinks of alcohol   Drug use: No   "

## 2024-08-07 ENCOUNTER — Encounter: Admitting: Rehabilitative and Restorative Service Providers"

## 2024-08-14 ENCOUNTER — Other Ambulatory Visit: Payer: PPO

## 2024-08-15 ENCOUNTER — Encounter: Admitting: Orthopedic Surgery

## 2024-08-15 ENCOUNTER — Encounter: Admitting: Rehabilitative and Restorative Service Providers"

## 2024-08-16 ENCOUNTER — Ambulatory Visit: Payer: PPO | Admitting: Internal Medicine

## 2024-08-22 ENCOUNTER — Encounter: Admitting: Rehabilitative and Restorative Service Providers"

## 2024-08-31 ENCOUNTER — Encounter: Admitting: Rehabilitative and Restorative Service Providers"

## 2024-09-05 ENCOUNTER — Encounter: Admitting: Rehabilitative and Restorative Service Providers"

## 2024-09-07 ENCOUNTER — Other Ambulatory Visit

## 2024-09-07 ENCOUNTER — Ambulatory Visit: Admitting: Oncology

## 2024-09-10 ENCOUNTER — Ambulatory Visit: Admitting: Endocrinology

## 2024-09-12 ENCOUNTER — Encounter: Admitting: Rehabilitative and Restorative Service Providers"

## 2024-09-19 ENCOUNTER — Encounter: Admitting: Rehabilitative and Restorative Service Providers"

## 2024-09-26 ENCOUNTER — Encounter: Admitting: Rehabilitative and Restorative Service Providers"
